# Patient Record
Sex: Male | Born: 1940 | Race: White | Hispanic: No | Marital: Married | Smoking: Never smoker
Health system: Southern US, Community
[De-identification: ages and names within clinical notes are randomized; demographics above are authoritative.]

## PROBLEM LIST (undated history)

## (undated) DIAGNOSIS — I10 Essential (primary) hypertension: Secondary | ICD-10-CM

## (undated) DIAGNOSIS — E785 Hyperlipidemia, unspecified: Secondary | ICD-10-CM

## (undated) DIAGNOSIS — I2510 Atherosclerotic heart disease of native coronary artery without angina pectoris: Secondary | ICD-10-CM

## (undated) DIAGNOSIS — M109 Gout, unspecified: Secondary | ICD-10-CM

## (undated) DIAGNOSIS — I739 Peripheral vascular disease, unspecified: Secondary | ICD-10-CM

## (undated) DIAGNOSIS — I219 Acute myocardial infarction, unspecified: Secondary | ICD-10-CM

## (undated) DIAGNOSIS — I779 Disorder of arteries and arterioles, unspecified: Secondary | ICD-10-CM

## (undated) HISTORY — PX: COLONOSCOPY: SHX174

## (undated) HISTORY — PX: TONSILLECTOMY: SUR1361

## (undated) HISTORY — DX: Atherosclerotic heart disease of native coronary artery without angina pectoris: I25.10

## (undated) HISTORY — DX: Peripheral vascular disease, unspecified: I73.9

## (undated) HISTORY — DX: Disorder of arteries and arterioles, unspecified: I77.9

## (undated) HISTORY — DX: Hyperlipidemia, unspecified: E78.5

---

## 2001-04-21 ENCOUNTER — Ambulatory Visit (HOSPITAL_COMMUNITY): Admission: RE | Admit: 2001-04-21 | Discharge: 2001-04-21 | Payer: Self-pay | Admitting: Internal Medicine

## 2001-04-21 ENCOUNTER — Encounter: Payer: Self-pay | Admitting: Internal Medicine

## 2002-04-07 HISTORY — PX: CORONARY ARTERY BYPASS GRAFT: SHX141

## 2002-06-07 ENCOUNTER — Encounter (HOSPITAL_COMMUNITY): Admission: RE | Admit: 2002-06-07 | Discharge: 2002-07-07 | Payer: Self-pay | Admitting: Orthopaedic Surgery

## 2002-06-07 ENCOUNTER — Encounter: Payer: Self-pay | Admitting: Family Medicine

## 2002-06-07 ENCOUNTER — Ambulatory Visit (HOSPITAL_COMMUNITY): Admission: RE | Admit: 2002-06-07 | Discharge: 2002-06-07 | Payer: Self-pay | Admitting: Family Medicine

## 2007-04-08 DIAGNOSIS — I219 Acute myocardial infarction, unspecified: Secondary | ICD-10-CM

## 2007-04-08 HISTORY — DX: Acute myocardial infarction, unspecified: I21.9

## 2007-04-08 HISTORY — PX: CARDIAC CATHETERIZATION: SHX172

## 2007-10-25 ENCOUNTER — Inpatient Hospital Stay (HOSPITAL_COMMUNITY): Admission: EM | Admit: 2007-10-25 | Discharge: 2007-10-28 | Payer: Self-pay | Admitting: Emergency Medicine

## 2008-04-07 HISTORY — PX: DOPPLER ECHOCARDIOGRAPHY: SHX263

## 2008-08-22 ENCOUNTER — Ambulatory Visit (HOSPITAL_COMMUNITY): Admission: RE | Admit: 2008-08-22 | Discharge: 2008-08-22 | Payer: Self-pay | Admitting: Family Medicine

## 2008-09-01 ENCOUNTER — Ambulatory Visit (HOSPITAL_COMMUNITY): Admission: RE | Admit: 2008-09-01 | Discharge: 2008-09-01 | Payer: Self-pay | Admitting: Family Medicine

## 2008-09-13 ENCOUNTER — Ambulatory Visit (HOSPITAL_COMMUNITY): Admission: RE | Admit: 2008-09-13 | Discharge: 2008-09-13 | Payer: Self-pay | Admitting: Sports Medicine

## 2008-09-22 ENCOUNTER — Encounter (HOSPITAL_COMMUNITY): Admission: RE | Admit: 2008-09-22 | Discharge: 2008-10-22 | Payer: Self-pay | Admitting: Chiropractic Medicine

## 2009-01-01 ENCOUNTER — Encounter: Payer: Self-pay | Admitting: Internal Medicine

## 2009-01-10 ENCOUNTER — Ambulatory Visit: Payer: Self-pay | Admitting: Internal Medicine

## 2009-01-10 ENCOUNTER — Ambulatory Visit (HOSPITAL_COMMUNITY): Admission: RE | Admit: 2009-01-10 | Discharge: 2009-01-10 | Payer: Self-pay | Admitting: Internal Medicine

## 2010-04-07 HISTORY — PX: NM MYOVIEW LTD: HXRAD82

## 2010-08-20 NOTE — Cardiovascular Report (Signed)
NAMEMarland Johnson  JACKEY, HOUSEY NO.:  [id number]   MEDICAL RECORD NO.:  [id number]          PATIENT TYPE:  INP   LOCATION:  2909                         FACILITY:  [facility]   PHYSICIAN:  Madaline Savage, M.D.DATE OF BIRTH:  04/02/1941   DATE OF PROCEDURE:  10/25/2007  DATE OF DISCHARGE:                            CARDIAC CATHETERIZATION   PROCEDURES PERFORMED:  1. Selective coronary angiography by Judkins technique.  2. Retrograde left heart catheterization.  3. Left ventricular angiography.  4. Selective visualization of the left internal mammary artery graft      to the left anterior descending artery.  5. Selective visualization of the saphenous vein graft to the distal      right coronary artery.  6. Percutaneous coronary artery stenting of a 100% proximally occluded      saphenous vein graft to the distal right carotid artery and the      entire length of the saphenous vein graft received overlapping      stents!   COMPLICATIONS:  None.   MEDICATIONS:  Given fentanyl and Versed for sedation, intravenous  Integrilin along with heparin with ACTs running in the mid 200s during  the case, IV nitroglycerin, intracoronary adenosine, and intracoronary  nitroglycerin.   DEVICES USED:  1. AngioJet device for thrombus and vein graft.  2. Balloon angioplasty followed by intracoronary stenting.   RESULT OF PROCEDURE:  The patient received 5 new non-drug-eluting  overlapping stents with an improved profile of blood flow, but poor  distal profusion, possibly due to a non-reflow phenomenon.   PATIENT PROFILE:  The patient is a 70 year old gentleman who began  having chest pain several days ago and went to the office at [facility] in [place] and was seen by Dr. Artis Delay.  There, Dr. Sherwood Gambler referred them to [facility] and he arrived in  our emergency room.  There, he had an EKG showing 1 mm of ST segment  elevation in his inferior leads II, III,  and aVF and also had a troponin  of 4.69.  The patient was placed on IV nitroglycerin and heparin and  later Integrilin was started, and he was scheduled for the first  available spot in the Cardiac Cath Lab, and we eventually came to the  Cath Lab and completed the case.  No complications occurred.   FINAL RESULT:  Suboptimal opacification of the stented new non-drug-  eluting stents deployed in the saphenous vein graft to the right  coronary artery, which was felt to be the culprit vessel of this  infarct.   PLAN:  The patient will be recovered in the Coronary Care Unit.  We will  plan on keeping him on 2b3a antiplatelet inhibitor, aspirin, heparin,  and he has received 600 mg of Plavix with 40 of famotidine.  We will  plan on restudying this right coronary artery in approximately 48 hours.           ______________________________  Madaline Savage, M.D.     WHG/MEDQ  D:  10/25/2007  T:  10/26/2007  Job:  [id number]   cc:  [facility] Cath Lab

## 2010-08-20 NOTE — Discharge Summary (Signed)
NAMEMarland Kitchen  Jason Johnson, Jason Johnson NO.:  [id number]   MEDICAL RECORD NO.:  [id number]          PATIENT TYPE:  INP   LOCATION:  2909                         FACILITY:  [facility]   PHYSICIAN:  Madaline Savage, M.D.DATE OF BIRTH:  05-27-1940   DATE OF ADMISSION:  10/25/2007  DATE OF DISCHARGE:  10/28/2007                               DISCHARGE SUMMARY   DISCHARGE DIAGNOSES:  1. Acute coronary syndrome status post ST-elevation myocardial      infarction on admission.  The patient underwent emergency      catheterization, with stenting of the saphenous vein graft to      posterior descending artery using multiple non-drug-eluting stents.  2. Known previous history of coronary artery disease, with coronary      artery bypass graft in 1999.  3. Hypertension.  4. Dyslipidemia, with elevated LDL and low HDL.   This is a 70 year old Caucasian gentleman who presented with complaints  of chest pain to his primary care physician in [place], Dr. Sherwood Gambler.  He complained of chest pain across the chest radiating to the left  shoulder and rated pain as 7/10.  It also radiated to the left arm, and  the patient had mild shortness of breath.  He also said that the  symptoms were identical to those prior to coronary artery bypass  grafting and primary care physician contacted our office and transported  the patient to [facility] for our further evaluation.  He has a  history of coronary artery disease and underwent CABG in [facility] in 1999.  Since then, was in good health up until the  day he presented to primary care physician with complaints of chest  pain.   EKG was performed and revealed ST elevations of 1 to 1.5 mm in the  inferior leads II, III, and aVF.  His troponin was elevated at 4.69 on  admission.  Code STEMI was initiated, and the patient went to  catheterization lab straight from the emergency department.  Dr. Elsie Lincoln  performed coronary angiography,  which revealed patent left internal  mammary artery graft to the LAD and occluded SVG to the RCA, and the  patient received 5 non-drug-eluting overlapping stents, with improvement  of the blood flow but poor distal perfusion, possibly due to non-reflow  phenomenon.  The patient tolerated the procedure well, and straight from  the catheterization lab was transferred to the Cardiac Critical Care  Unit, and he was given weight-adjusted Integrilin and heparin as well as  nitroglycerin drip.  The next day, the patient was seen by Dr. Jacinto Halim and  was enrolled in cardiac rehabilitation, phase I.  Dr. Elsie Lincoln was  following the patient through the rest of his hospital stay, and on October 28, 2007, he was considered to be stable for discharge home.   HOSPITAL LABORATORIES:  His cardiac enzymes on admission were elevated;  total CK of 173, CK-MB 65, relative index 13.7, and troponin 4.39.  Second set showed total CK 400, CK-MB 36.3, relative index 9.1, and  troponin 7.71, and this was the peak of markers.  The following cardiac  panels revealed declining trend, with the last panel on October 28, 2007,  showing total CK 166, CK-MB 8.5, relative index 5.1, and troponin 4.58.  CBC showed white blood cell count 9.9, hemoglobin 12.4, hematocrit 35.9,  and platelet count 191.  Lipid profile revealed total cholesterol 150,  triglycerides 97, HDL 29, and cholesterol LDL 102.  BMET revealed sodium  137, potassium 3.9, chloride 103, CO2 of 26, glucose 90, BUN 13, and  creatinine 1.01.   Magnesium level 2.3.  TSH 1.089.   The patient was enrolled in cardiac rehabilitation phase II at a time  prior to his discharge home.   DISCHARGE INSTRUCTIONS:  The patient is to stay on low-fat, low-  cholesterol diet.  He was instructed to increase activity slowly and  advised not to engage in strenuous physical activity, driving, or do  heavy lifting for a week post catheterization.   DISCHARGE MEDICATIONS:  Plavix 75 mg  daily, aspirin 325 mg daily,  Vasotec 20 mg daily, Coreg 12.5 mg b.i.d., Imdur 30 mg daily, Zocor 40  mg daily, and Niaspan 1000 mg daily.   DISCHARGE FOLLOWUP:  Scheduled in our office in [place] with Dr.  Elsie Lincoln on November 08, 2007, at 11:30.       Raymon Mutton, P.A.    ______________________________  Madaline Savage, M.D.    MK/MEDQ  D:  10/28/2007  T:  10/29/2007  Job:  [id number]   cc:   Madaline Savage, M.D.  [facility]  [facility]  Madelin Rear. Sherwood Gambler, MD

## 2011-01-03 LAB — CARDIAC PANEL(CRET KIN+CKTOT+MB+TROPI)
CK, MB: 16.7 — ABNORMAL HIGH
Relative Index: 8.9 — ABNORMAL HIGH
Total CK: 235 — ABNORMAL HIGH
Troponin I: 7.71

## 2011-01-03 LAB — LIPID PANEL
Cholesterol: 239 — ABNORMAL HIGH
LDL Cholesterol: 175 — ABNORMAL HIGH
Total CHOL/HDL Ratio: 5.2
Total CHOL/HDL Ratio: 5.8
Triglycerides: 97
VLDL: 19

## 2011-01-03 LAB — DIFFERENTIAL
Basophils Absolute: 0
Basophils Relative: 0
Eosinophils Absolute: 0
Eosinophils Relative: 0
Lymphocytes Relative: 7 — ABNORMAL LOW
Monocytes Absolute: 0.7

## 2011-01-03 LAB — BASIC METABOLIC PANEL
BUN: 11
CO2: 25
CO2: 29
Chloride: 103
Chloride: 103
Creatinine, Ser: 1.01
GFR calc Af Amer: >60
GFR calc non Af Amer: >60
GFR calc non Af Amer: >60
Glucose, Bld: 104 — ABNORMAL HIGH
Glucose, Bld: 98
Potassium: 3.7
Potassium: 3.9
Sodium: 132 — ABNORMAL LOW

## 2011-01-03 LAB — CBC
HCT: 36.1 — ABNORMAL LOW
HCT: 41.9
HCT: 47.4
Hemoglobin: 12.4 — ABNORMAL LOW
Hemoglobin: 14.1
Hemoglobin: 14.1
Hemoglobin: 16.3
MCHC: 34.1
MCHC: 34.5
MCV: 91.5
MCV: 92.2
RBC: 3.92 — ABNORMAL LOW
RBC: 3.92 — ABNORMAL LOW
RDW: 13.1
RDW: 13.2
RDW: 13.5
WBC: 11.1 — ABNORMAL HIGH
WBC: 14 — ABNORMAL HIGH
WBC: 9.9

## 2011-01-03 LAB — COMPREHENSIVE METABOLIC PANEL
ALT: 25
AST: 76 — ABNORMAL HIGH
Albumin: 4.1
Alkaline Phosphatase: 53
Chloride: 103
GFR calc Af Amer: >60
Potassium: 4
Sodium: 139
Total Bilirubin: 0.9

## 2011-01-03 LAB — APTT: aPTT: 30

## 2011-01-03 LAB — HEPARIN LEVEL (UNFRACTIONATED): Heparin Unfractionated: 0.31

## 2011-01-03 LAB — PLATELET COUNT: Platelets: 214

## 2011-01-03 LAB — CK TOTAL AND CKMB (NOT AT ARMC): Relative Index: 13.7 — ABNORMAL HIGH

## 2011-05-20 DIAGNOSIS — I2510 Atherosclerotic heart disease of native coronary artery without angina pectoris: Secondary | ICD-10-CM | POA: Diagnosis not present

## 2011-05-20 DIAGNOSIS — I10 Essential (primary) hypertension: Secondary | ICD-10-CM | POA: Diagnosis not present

## 2011-05-20 DIAGNOSIS — E782 Mixed hyperlipidemia: Secondary | ICD-10-CM | POA: Diagnosis not present

## 2011-06-19 DIAGNOSIS — R0989 Other specified symptoms and signs involving the circulatory and respiratory systems: Secondary | ICD-10-CM | POA: Diagnosis not present

## 2011-11-06 DIAGNOSIS — I6529 Occlusion and stenosis of unspecified carotid artery: Secondary | ICD-10-CM | POA: Diagnosis not present

## 2011-11-06 DIAGNOSIS — I10 Essential (primary) hypertension: Secondary | ICD-10-CM | POA: Diagnosis not present

## 2011-11-06 DIAGNOSIS — I2510 Atherosclerotic heart disease of native coronary artery without angina pectoris: Secondary | ICD-10-CM | POA: Diagnosis not present

## 2011-11-06 DIAGNOSIS — E782 Mixed hyperlipidemia: Secondary | ICD-10-CM | POA: Diagnosis not present

## 2011-11-11 DIAGNOSIS — E782 Mixed hyperlipidemia: Secondary | ICD-10-CM | POA: Diagnosis not present

## 2011-11-11 DIAGNOSIS — Z79899 Other long term (current) drug therapy: Secondary | ICD-10-CM | POA: Diagnosis not present

## 2011-12-23 DIAGNOSIS — I6529 Occlusion and stenosis of unspecified carotid artery: Secondary | ICD-10-CM | POA: Diagnosis not present

## 2012-01-30 DIAGNOSIS — I2510 Atherosclerotic heart disease of native coronary artery without angina pectoris: Secondary | ICD-10-CM | POA: Diagnosis not present

## 2012-01-30 DIAGNOSIS — I10 Essential (primary) hypertension: Secondary | ICD-10-CM | POA: Diagnosis not present

## 2012-01-30 DIAGNOSIS — J019 Acute sinusitis, unspecified: Secondary | ICD-10-CM | POA: Diagnosis not present

## 2012-01-30 DIAGNOSIS — E785 Hyperlipidemia, unspecified: Secondary | ICD-10-CM | POA: Diagnosis not present

## 2012-01-30 DIAGNOSIS — J069 Acute upper respiratory infection, unspecified: Secondary | ICD-10-CM | POA: Diagnosis not present

## 2012-01-30 DIAGNOSIS — Z6828 Body mass index (BMI) 28.0-28.9, adult: Secondary | ICD-10-CM | POA: Diagnosis not present

## 2012-02-07 DIAGNOSIS — Z23 Encounter for immunization: Secondary | ICD-10-CM | POA: Diagnosis not present

## 2012-05-19 DIAGNOSIS — I10 Essential (primary) hypertension: Secondary | ICD-10-CM | POA: Diagnosis not present

## 2012-05-19 DIAGNOSIS — Z6828 Body mass index (BMI) 28.0-28.9, adult: Secondary | ICD-10-CM | POA: Diagnosis not present

## 2012-05-19 DIAGNOSIS — J019 Acute sinusitis, unspecified: Secondary | ICD-10-CM | POA: Diagnosis not present

## 2012-07-01 DIAGNOSIS — I10 Essential (primary) hypertension: Secondary | ICD-10-CM | POA: Diagnosis not present

## 2012-07-01 DIAGNOSIS — E782 Mixed hyperlipidemia: Secondary | ICD-10-CM | POA: Diagnosis not present

## 2012-07-01 DIAGNOSIS — I6529 Occlusion and stenosis of unspecified carotid artery: Secondary | ICD-10-CM | POA: Diagnosis not present

## 2012-07-01 DIAGNOSIS — I2510 Atherosclerotic heart disease of native coronary artery without angina pectoris: Secondary | ICD-10-CM | POA: Diagnosis not present

## 2012-07-05 ENCOUNTER — Other Ambulatory Visit (HOSPITAL_COMMUNITY): Payer: Self-pay | Admitting: Cardiovascular Disease

## 2012-07-15 DIAGNOSIS — H2589 Other age-related cataract: Secondary | ICD-10-CM | POA: Diagnosis not present

## 2012-07-15 DIAGNOSIS — H5210 Myopia, unspecified eye: Secondary | ICD-10-CM | POA: Diagnosis not present

## 2012-07-15 DIAGNOSIS — H52229 Regular astigmatism, unspecified eye: Secondary | ICD-10-CM | POA: Diagnosis not present

## 2012-07-15 DIAGNOSIS — H524 Presbyopia: Secondary | ICD-10-CM | POA: Diagnosis not present

## 2012-07-29 ENCOUNTER — Ambulatory Visit (HOSPITAL_COMMUNITY)
Admission: RE | Admit: 2012-07-29 | Discharge: 2012-07-29 | Disposition: A | Discharge status: Home / Self Care | Payer: Medicare Other | Source: Ambulatory Visit | Attending: Cardiovascular Disease | Admitting: Cardiovascular Disease

## 2012-07-29 DIAGNOSIS — I7789 Other specified disorders of arteries and arterioles: Secondary | ICD-10-CM | POA: Insufficient documentation

## 2012-07-29 DIAGNOSIS — I6529 Occlusion and stenosis of unspecified carotid artery: Secondary | ICD-10-CM | POA: Diagnosis not present

## 2012-07-29 NOTE — Progress Notes (Signed)
Carotid Duplex Imaging Complete Gilad Dugger 

## 2012-08-05 ENCOUNTER — Other Ambulatory Visit (HOSPITAL_COMMUNITY): Payer: Self-pay | Admitting: *Deleted

## 2012-08-19 DIAGNOSIS — H2589 Other age-related cataract: Secondary | ICD-10-CM | POA: Diagnosis not present

## 2012-08-31 ENCOUNTER — Encounter (HOSPITAL_COMMUNITY): Payer: Self-pay | Admitting: Pharmacy Technician

## 2012-09-02 NOTE — Patient Instructions (Addendum)
Your procedure is scheduled on: 09/09/2012  Report to [facility] at  1300    AM.  Call this number if you have problems the morning of surgery: [phone]   Do not eat food or drink liquids :After Midnight.      Take these medicines the morning of surgery with A SIP OF WATER: coreg   Do not wear jewelry, make-up or nail polish.  Do not wear lotions, powders, or perfumes.   Do not shave 48 hours prior to surgery.  Do not bring valuables to the hospital.  Contacts, dentures or bridgework may not be worn into surgery.  Leave suitcase in the car. After surgery it may be brought to your room.  For patients admitted to the hospital, checkout time is 11:00 AM the day of discharge.   Patients discharged the day of surgery will not be allowed to drive home.  :     Please read over the following fact sheets that you were given: Coughing and Deep Breathing, Surgical Site Infection Prevention, Anesthesia Post-op Instructions and Care and Recovery After Surgery    Cataract A cataract is a clouding of the lens of the eye. When a lens becomes cloudy, vision is reduced based on the degree and nature of the clouding. Many cataracts reduce vision to some degree. Some cataracts make people more near-sighted as they develop. Other cataracts increase glare. Cataracts that are ignored and become worse can sometimes look white. The white color can be seen through the pupil. CAUSES   Aging. However, cataracts may occur at any age, even in newborns.   Certain drugs.   Trauma to the eye.   Certain diseases such as diabetes.   Specific eye diseases such as chronic inflammation inside the eye or a sudden attack of a rare form of glaucoma.   Inherited or acquired medical problems.  SYMPTOMS   Gradual, progressive drop in vision in the affected eye.   Severe, rapid visual loss. This most often happens when trauma is the cause.  DIAGNOSIS  To detect a cataract, an eye doctor examines the lens. Cataracts  are best diagnosed with an exam of the eyes with the pupils enlarged (dilated) by drops.  TREATMENT  For an early cataract, vision may improve by using different eyeglasses or stronger lighting. If that does not help your vision, surgery is the only effective treatment. A cataract needs to be surgically removed when vision loss interferes with your everyday activities, such as driving, reading, or watching TV. A cataract may also have to be removed if it prevents examination or treatment of another eye problem. Surgery removes the cloudy lens and usually replaces it with a substitute lens (intraocular lens, IOL).  At a time when both you and your doctor agree, the cataract will be surgically removed. If you have cataracts in both eyes, only one is usually removed at a time. This allows the operated eye to heal and be out of danger from any possible problems after surgery (such as infection or poor wound healing). In rare cases, a cataract may be doing damage to your eye. In these cases, your caregiver may advise surgical removal right away. The vast majority of people who have cataract surgery have better vision afterward. HOME CARE INSTRUCTIONS  If you are not planning surgery, you may be asked to do the following:  Use different eyeglasses.   Use stronger or brighter lighting.   Ask your eye doctor about reducing your medicine dose or changing  medicines if it is thought that a medicine caused your cataract. Changing medicines does not make the cataract go away on its own.   Become familiar with your surroundings. Poor vision can lead to injury. Avoid bumping into things on the affected side. You are at a higher risk for tripping or falling.   Exercise extreme care when driving or operating machinery.   Wear sunglasses if you are sensitive to bright light or experiencing problems with glare.  SEEK IMMEDIATE MEDICAL CARE IF:   You have a worsening or sudden vision loss.   You notice redness,  swelling, or increasing pain in the eye.   You have a fever.  Document Released: 03/24/2005 Document Revised: 03/13/2011 Document Reviewed: 11/15/2010 [facility] Patient Information 2012 [place].PATIENT INSTRUCTIONS POST-ANESTHESIA  IMMEDIATELY FOLLOWING SURGERY:  Do not drive or operate machinery for the first twenty four hours after surgery.  Do not make any important decisions for twenty four hours after surgery or while taking narcotic pain medications or sedatives.  If you develop intractable nausea and vomiting or a severe headache please notify your doctor immediately.  FOLLOW-UP:  Please make an appointment with your surgeon as instructed. You do not need to follow up with anesthesia unless specifically instructed to do so.  WOUND CARE INSTRUCTIONS (if applicable):  Keep a dry clean dressing on the anesthesia/puncture wound site if there is drainage.  Once the wound has quit draining you may leave it open to air.  Generally you should leave the bandage intact for twenty four hours unless there is drainage.  If the epidural site drains for more than 36-48 hours please call the anesthesia department.  QUESTIONS?:  Please feel free to call your physician or the hospital operator if you have any questions, and they will be happy to assist you.

## 2012-09-03 ENCOUNTER — Encounter (HOSPITAL_COMMUNITY)
Admission: RE | Admit: 2012-09-03 | Discharge: 2012-09-03 | Disposition: A | Discharge status: Home / Self Care | Payer: Medicare Other | Source: Ambulatory Visit | Attending: Ophthalmology | Admitting: Ophthalmology

## 2012-09-03 ENCOUNTER — Encounter (HOSPITAL_COMMUNITY): Payer: Self-pay

## 2012-09-03 DIAGNOSIS — Z01812 Encounter for preprocedural laboratory examination: Secondary | ICD-10-CM | POA: Diagnosis not present

## 2012-09-03 DIAGNOSIS — I10 Essential (primary) hypertension: Secondary | ICD-10-CM | POA: Diagnosis not present

## 2012-09-03 DIAGNOSIS — H2589 Other age-related cataract: Secondary | ICD-10-CM | POA: Diagnosis not present

## 2012-09-03 DIAGNOSIS — Z79899 Other long term (current) drug therapy: Secondary | ICD-10-CM | POA: Diagnosis not present

## 2012-09-03 DIAGNOSIS — Z01810 Encounter for preprocedural cardiovascular examination: Secondary | ICD-10-CM | POA: Diagnosis not present

## 2012-09-03 HISTORY — DX: Essential (primary) hypertension: I10

## 2012-09-03 HISTORY — DX: Acute myocardial infarction, unspecified: I21.9

## 2012-09-03 LAB — BASIC METABOLIC PANEL
CO2: 28 mEq/L (ref 19–32)
Glucose, Bld: 89 mg/dL (ref 70–99)
Potassium: 4.4 mEq/L (ref 3.5–5.1)
Sodium: 139 mEq/L (ref 135–145)

## 2012-09-03 LAB — HEMOGLOBIN AND HEMATOCRIT, BLOOD: HCT: 42.6 % (ref 39.0–52.0)

## 2012-09-03 NOTE — Progress Notes (Signed)
09/03/12 1410  OBSTRUCTIVE SLEEP APNEA  Have you ever been diagnosed with sleep apnea through a sleep study? No  Do you snore loudly (loud enough to be heard through closed doors)?  1  Do you often feel tired, fatigued, or sleepy during the daytime? 0  Has anyone observed you stop breathing during your sleep? 0  Do you have, or are you being treated for high blood pressure? 1  BMI more than 35 kg/m2? 0  Age over 70 years old? 1  Neck circumference greater than 40 cm/18 inches? 0  Gender: 1  Obstructive Sleep Apnea Score 4  Score 4 or greater  Results sent to PCP

## 2012-09-09 ENCOUNTER — Encounter (HOSPITAL_COMMUNITY): Payer: Self-pay | Admitting: Anesthesiology

## 2012-09-09 ENCOUNTER — Ambulatory Visit (HOSPITAL_COMMUNITY)
Admission: RE | Admit: 2012-09-09 | Discharge: 2012-09-09 | Disposition: A | Discharge status: Home / Self Care | Payer: Medicare Other | Source: Ambulatory Visit | Attending: Ophthalmology | Admitting: Ophthalmology

## 2012-09-09 ENCOUNTER — Encounter (HOSPITAL_COMMUNITY)
Admission: RE | Disposition: A | Discharge status: Home / Self Care | Payer: Self-pay | Source: Ambulatory Visit | Attending: Ophthalmology

## 2012-09-09 ENCOUNTER — Encounter (HOSPITAL_COMMUNITY): Payer: Self-pay | Admitting: *Deleted

## 2012-09-09 ENCOUNTER — Ambulatory Visit (HOSPITAL_COMMUNITY): Payer: Medicare Other | Admitting: Anesthesiology

## 2012-09-09 DIAGNOSIS — H269 Unspecified cataract: Secondary | ICD-10-CM | POA: Diagnosis not present

## 2012-09-09 DIAGNOSIS — Z79899 Other long term (current) drug therapy: Secondary | ICD-10-CM | POA: Insufficient documentation

## 2012-09-09 DIAGNOSIS — Z01812 Encounter for preprocedural laboratory examination: Secondary | ICD-10-CM | POA: Insufficient documentation

## 2012-09-09 DIAGNOSIS — I10 Essential (primary) hypertension: Secondary | ICD-10-CM | POA: Diagnosis not present

## 2012-09-09 DIAGNOSIS — H2589 Other age-related cataract: Secondary | ICD-10-CM | POA: Insufficient documentation

## 2012-09-09 DIAGNOSIS — Z01810 Encounter for preprocedural cardiovascular examination: Secondary | ICD-10-CM | POA: Diagnosis not present

## 2012-09-09 DIAGNOSIS — H2510 Age-related nuclear cataract, unspecified eye: Secondary | ICD-10-CM | POA: Diagnosis not present

## 2012-09-09 HISTORY — PX: CATARACT EXTRACTION W/PHACO: SHX586

## 2012-09-09 SURGERY — PHACOEMULSIFICATION, CATARACT, WITH IOL INSERTION
Anesthesia: Monitor Anesthesia Care | Site: Eye | Laterality: Right | Wound class: Clean

## 2012-09-09 MED ORDER — BSS IO SOLN
INTRAOCULAR | Status: DC | PRN
  Administered 2012-09-09: 15 mL via INTRAOCULAR

## 2012-09-09 MED ORDER — PHENYLEPHRINE HCL 2.5 % OP SOLN
1.0000 [drp] | OPHTHALMIC | Status: AC
  Administered 2012-09-09 (×3): 1 [drp] via OPHTHALMIC

## 2012-09-09 MED ORDER — PHENYLEPHRINE HCL 2.5 % OP SOLN
OPHTHALMIC | Status: AC
  Filled 2012-09-09: qty 2

## 2012-09-09 MED ORDER — CYCLOPENTOLATE-PHENYLEPHRINE 0.2-1 % OP SOLN
OPHTHALMIC | Status: AC
  Filled 2012-09-09: qty 2

## 2012-09-09 MED ORDER — LACTATED RINGERS IV SOLN
INTRAVENOUS | Status: DC
  Administered 2012-09-09: 15:00:00 via INTRAVENOUS

## 2012-09-09 MED ORDER — TETRACAINE HCL 0.5 % OP SOLN
OPHTHALMIC | Status: AC
  Filled 2012-09-09: qty 2

## 2012-09-09 MED ORDER — NEOMYCIN-POLYMYXIN-DEXAMETH 3.5-10000-0.1 OP OINT
TOPICAL_OINTMENT | OPHTHALMIC | Status: AC
  Filled 2012-09-09: qty 3.5

## 2012-09-09 MED ORDER — CYCLOPENTOLATE-PHENYLEPHRINE 0.2-1 % OP SOLN
1.0000 [drp] | OPHTHALMIC | Status: AC
  Administered 2012-09-09 (×3): 1 [drp] via OPHTHALMIC

## 2012-09-09 MED ORDER — EPINEPHRINE HCL 1 MG/ML IJ SOLN
INTRAOCULAR | Status: DC | PRN
  Administered 2012-09-09: 15:00:00

## 2012-09-09 MED ORDER — MIDAZOLAM HCL 2 MG/2ML IJ SOLN
INTRAMUSCULAR | Status: AC
  Filled 2012-09-09: qty 2

## 2012-09-09 MED ORDER — LIDOCAINE HCL 3.5 % OP GEL
OPHTHALMIC | Status: AC
  Filled 2012-09-09: qty 5

## 2012-09-09 MED ORDER — LIDOCAINE HCL (PF) 1 % IJ SOLN
INTRAMUSCULAR | Status: AC
  Filled 2012-09-09: qty 2

## 2012-09-09 MED ORDER — PROVISC 10 MG/ML IO SOLN
INTRAOCULAR | Status: DC | PRN
  Administered 2012-09-09: 8.5 mg via INTRAOCULAR

## 2012-09-09 MED ORDER — LACTATED RINGERS IV SOLN
INTRAVENOUS | Status: DC | PRN
  Administered 2012-09-09: 14:00:00 via INTRAVENOUS

## 2012-09-09 MED ORDER — EPINEPHRINE HCL 1 MG/ML IJ SOLN
INTRAMUSCULAR | Status: AC
  Filled 2012-09-09: qty 1

## 2012-09-09 MED ORDER — LIDOCAINE HCL 3.5 % OP GEL
1.0000 "application " | Freq: Once | OPHTHALMIC | Status: AC
  Administered 2012-09-09: 1 via OPHTHALMIC

## 2012-09-09 MED ORDER — POVIDONE-IODINE 5 % OP SOLN
OPHTHALMIC | Status: DC | PRN
  Administered 2012-09-09: 1 via OPHTHALMIC

## 2012-09-09 MED ORDER — TETRACAINE HCL 0.5 % OP SOLN
1.0000 [drp] | OPHTHALMIC | Status: AC
  Administered 2012-09-09 (×3): 1 [drp] via OPHTHALMIC

## 2012-09-09 MED ORDER — NEOMYCIN-POLYMYXIN-DEXAMETH 0.1 % OP OINT
TOPICAL_OINTMENT | OPHTHALMIC | Status: DC | PRN
  Administered 2012-09-09: 1 via OPHTHALMIC

## 2012-09-09 MED ORDER — LIDOCAINE HCL (PF) 1 % IJ SOLN
INTRAMUSCULAR | Status: DC | PRN
Start: 2012-09-09 — End: 2012-09-09
  Administered 2012-09-09: .5 mL

## 2012-09-09 MED ORDER — MIDAZOLAM HCL 2 MG/2ML IJ SOLN
1.0000 mg | INTRAMUSCULAR | Status: DC | PRN
  Administered 2012-09-09: 2 mg via INTRAVENOUS

## 2012-09-09 SURGICAL SUPPLY — 32 items

## 2012-09-09 NOTE — Anesthesia Procedure Notes (Signed)
Procedure Name: MAC Date/Time: 09/09/2012 3:20 PM Performed by: Carolyne Littles, AMY L Pre-anesthesia Checklist: Patient identified, Timeout performed, Emergency Drugs available, Suction available and Patient being monitored Oxygen Delivery Method: Nasal cannula

## 2012-09-09 NOTE — H&P (Signed)
I have reviewed the H&P, the patient was re-examined, and I have identified no interval changes in medical condition and plan of care since the history and physical of record  

## 2012-09-09 NOTE — Anesthesia Preprocedure Evaluation (Addendum)
Anesthesia Evaluation  Patient identified by MRN, date of birth, ID band Patient awake    Reviewed: Allergy & Precautions, H&P , NPO status , Patient's Chart, lab work & pertinent test results  Airway Mallampati: II TM Distance: >3 FB     Dental  (+) Teeth Intact   Pulmonary neg pulmonary ROS,  breath sounds clear to auscultation        Cardiovascular hypertension, Pt. on medications + CAD, + Past MI and + CABG Rhythm:Regular Rate:Normal     Neuro/Psych    GI/Hepatic negative GI ROS,   Endo/Other    Renal/GU      Musculoskeletal   Abdominal   Peds  Hematology   Anesthesia Other Findings   Reproductive/Obstetrics                           Anesthesia Physical Anesthesia Plan  ASA: III  Anesthesia Plan: MAC   Post-op Pain Management:    Induction: Intravenous  Airway Management Planned: Nasal Cannula  Additional Equipment:   Intra-op Plan:   Post-operative Plan:   Informed Consent: I have reviewed the patients History and Physical, chart, labs and discussed the procedure including the risks, benefits and alternatives for the proposed anesthesia with the patient or authorized representative who has indicated his/her understanding and acceptance.     Plan Discussed with:   Anesthesia Plan Comments:         Anesthesia Quick Evaluation

## 2012-09-09 NOTE — Anesthesia Postprocedure Evaluation (Signed)
  Anesthesia Post-op Note  Patient: Jason Johnson  Procedure(s) Performed: Procedure(s) with comments: CATARACT EXTRACTION PHACO AND INTRAOCULAR LENS PLACEMENT (IOC) (Right) - CDE 14.48  Patient Location: PACU and Short Stay  Anesthesia Type:MAC  Level of Consciousness: awake, alert  and oriented  Airway and Oxygen Therapy: Patient Spontanous Breathing  Post-op Pain: none  Post-op Assessment: Post-op Vital signs reviewed, Patient's Cardiovascular Status Stable, Respiratory Function Stable, Patent Airway and No signs of Nausea or vomiting  Post-op Vital Signs: Reviewed and stable  Complications: No apparent anesthesia complications

## 2012-09-09 NOTE — Op Note (Signed)
Date of Admission: 09/09/2012  Date of Surgery: 09/09/2012  Pre-Op Dx: Cataract  Right  Eye  Post-Op Dx: Combined Cataract  Right  Eye,  Dx Code 366.19  Surgeon: Gemma Payor, M.D.  Assistants: None  Anesthesia: Topical with MAC  Indications: Painless, progressive loss of vision with compromise of daily activities.  Surgery: Cataract Extraction with Intraocular lens Implant Right Eye  Discription: The patient had dilating drops and viscous lidocaine placed into the left eye in the pre-op holding area. After transfer to the operating room, a time out was performed. The patient was then prepped and draped. Beginning with a 75 degree blade a paracentesis port was made at the surgeon's 2 o'clock position. The anterior chamber was then filled with 1% non-preserved lidocaine. This was followed by filling the anterior chamber with Provisc. A bent cystatome needle was used to create a continuous tear capsulotomy. Hydrodissection was performed with balanced salt solution on a Fine canula. The lens nucleus was then removed using the phacoemulsification handpiece. Residual cortex was removed with the I&A handpiece. The anterior chamber and capsular bag were refilled with Provisc. A posterior chamber intraocular lens was placed into the capsular bag with it's injector. The implant was positioned with the Kuglan hook. The Provisc was then removed from the anterior chamber and capsular bag with the I&A handpiece. Stromal hydration of the main incision and paracentesis port was performed with BSS on a Fine canula. The wounds were tested for leak which was negative. The patient tolerated the procedure well. There were no operative complications. The patient was then transferred to the recovery room in stable condition.  Complications: None  Specimen: None  EBL: None  Prosthetic device: B&L enVista, MX60, power 11.5D.

## 2012-09-09 NOTE — Transfer of Care (Signed)
Immediate Anesthesia Transfer of Care Note  Patient: Jason Johnson  Procedure(s) Performed: Procedure(s) with comments: CATARACT EXTRACTION PHACO AND INTRAOCULAR LENS PLACEMENT (IOC) (Right) - CDE 14.48  Patient Location: PACU and Short Stay  Anesthesia Type:MAC  Level of Consciousness: awake, alert  and oriented  Airway & Oxygen Therapy: Patient Spontanous Breathing  Post-op Assessment: Report given to PACU RN  Post vital signs: Reviewed and stable  Complications: No apparent anesthesia complications

## 2012-09-09 NOTE — Preoperative (Signed)
Beta Blockers   Reason not to administer Beta Blockers:Not Applicable 

## 2012-09-10 ENCOUNTER — Encounter (HOSPITAL_COMMUNITY): Payer: Self-pay | Admitting: Ophthalmology

## 2012-09-10 DIAGNOSIS — H2510 Age-related nuclear cataract, unspecified eye: Secondary | ICD-10-CM | POA: Diagnosis not present

## 2012-09-22 ENCOUNTER — Encounter (HOSPITAL_COMMUNITY): Payer: Self-pay | Admitting: Pharmacy Technician

## 2012-09-28 DIAGNOSIS — H2589 Other age-related cataract: Secondary | ICD-10-CM | POA: Diagnosis not present

## 2012-09-30 ENCOUNTER — Encounter (HOSPITAL_COMMUNITY)
Admission: RE | Admit: 2012-09-30 | Discharge: 2012-09-30 | Disposition: A | Discharge status: Home / Self Care | Payer: Medicare Other | Source: Ambulatory Visit | Attending: Ophthalmology | Admitting: Ophthalmology

## 2012-09-30 MED ORDER — ONDANSETRON HCL 4 MG/2ML IJ SOLN: 4.0000 mg | Freq: Once | INTRAMUSCULAR | Status: AC | PRN

## 2012-09-30 MED ORDER — FENTANYL CITRATE 0.05 MG/ML IJ SOLN: 25.0000 ug | INTRAMUSCULAR | Status: DC | PRN

## 2012-10-06 MED ORDER — LIDOCAINE HCL 3.5 % OP GEL
OPHTHALMIC | Status: AC
  Filled 2012-10-06: qty 5

## 2012-10-06 MED ORDER — CYCLOPENTOLATE-PHENYLEPHRINE 0.2-1 % OP SOLN
OPHTHALMIC | Status: AC
  Filled 2012-10-06: qty 2

## 2012-10-06 MED ORDER — TETRACAINE HCL 0.5 % OP SOLN
OPHTHALMIC | Status: AC
  Filled 2012-10-06: qty 2

## 2012-10-06 MED ORDER — NEOMYCIN-POLYMYXIN-DEXAMETH 3.5-10000-0.1 OP OINT
TOPICAL_OINTMENT | OPHTHALMIC | Status: AC
  Filled 2012-10-06: qty 3.5

## 2012-10-06 MED ORDER — LIDOCAINE HCL (PF) 1 % IJ SOLN
INTRAMUSCULAR | Status: AC
  Filled 2012-10-06: qty 2

## 2012-10-07 ENCOUNTER — Encounter (HOSPITAL_COMMUNITY): Payer: Self-pay | Admitting: Anesthesiology

## 2012-10-07 ENCOUNTER — Encounter (HOSPITAL_COMMUNITY): Payer: Self-pay | Admitting: *Deleted

## 2012-10-07 ENCOUNTER — Ambulatory Visit (HOSPITAL_COMMUNITY): Payer: Medicare Other | Admitting: Anesthesiology

## 2012-10-07 ENCOUNTER — Encounter (HOSPITAL_COMMUNITY)
Admission: RE | Disposition: A | Discharge status: Home / Self Care | Payer: Self-pay | Source: Ambulatory Visit | Attending: Ophthalmology

## 2012-10-07 ENCOUNTER — Ambulatory Visit (HOSPITAL_COMMUNITY)
Admission: RE | Admit: 2012-10-07 | Discharge: 2012-10-07 | Disposition: A | Discharge status: Home / Self Care | Payer: Medicare Other | Source: Ambulatory Visit | Attending: Ophthalmology | Admitting: Ophthalmology

## 2012-10-07 DIAGNOSIS — Z79899 Other long term (current) drug therapy: Secondary | ICD-10-CM | POA: Insufficient documentation

## 2012-10-07 DIAGNOSIS — I10 Essential (primary) hypertension: Secondary | ICD-10-CM | POA: Diagnosis not present

## 2012-10-07 DIAGNOSIS — H2589 Other age-related cataract: Secondary | ICD-10-CM | POA: Insufficient documentation

## 2012-10-07 DIAGNOSIS — Z01810 Encounter for preprocedural cardiovascular examination: Secondary | ICD-10-CM | POA: Insufficient documentation

## 2012-10-07 DIAGNOSIS — H269 Unspecified cataract: Secondary | ICD-10-CM | POA: Diagnosis not present

## 2012-10-07 HISTORY — PX: CATARACT EXTRACTION W/PHACO: SHX586

## 2012-10-07 SURGERY — PHACOEMULSIFICATION, CATARACT, WITH IOL INSERTION
Anesthesia: Monitor Anesthesia Care | Site: Eye | Laterality: Left | Wound class: Clean

## 2012-10-07 MED ORDER — ONDANSETRON HCL 4 MG/2ML IJ SOLN: 4.0000 mg | Freq: Once | INTRAMUSCULAR | Status: DC | PRN

## 2012-10-07 MED ORDER — LIDOCAINE 3.5 % OP GEL OPTIME - NO CHARGE
OPHTHALMIC | Status: DC | PRN
  Administered 2012-10-07: 1 [drp] via OPHTHALMIC

## 2012-10-07 MED ORDER — TETRACAINE HCL 0.5 % OP SOLN
1.0000 [drp] | OPHTHALMIC | Status: AC
  Administered 2012-10-07 (×3): 1 [drp] via OPHTHALMIC

## 2012-10-07 MED ORDER — MIDAZOLAM HCL 2 MG/2ML IJ SOLN
1.0000 mg | INTRAMUSCULAR | Status: DC | PRN
  Administered 2012-10-07: 2 mg via INTRAVENOUS

## 2012-10-07 MED ORDER — MIDAZOLAM HCL 2 MG/2ML IJ SOLN
INTRAMUSCULAR | Status: AC
  Filled 2012-10-07: qty 2

## 2012-10-07 MED ORDER — NEOMYCIN-POLYMYXIN-DEXAMETH 0.1 % OP OINT
TOPICAL_OINTMENT | OPHTHALMIC | Status: DC | PRN
  Administered 2012-10-07: 1 via OPHTHALMIC

## 2012-10-07 MED ORDER — CYCLOPENTOLATE-PHENYLEPHRINE 0.2-1 % OP SOLN
1.0000 [drp] | OPHTHALMIC | Status: AC
  Administered 2012-10-07 (×3): 1 [drp] via OPHTHALMIC

## 2012-10-07 MED ORDER — BSS IO SOLN
INTRAOCULAR | Status: DC | PRN
  Administered 2012-10-07: 15 mL via INTRAOCULAR

## 2012-10-07 MED ORDER — FENTANYL CITRATE 0.05 MG/ML IJ SOLN: 25.0000 ug | INTRAMUSCULAR | Status: DC | PRN

## 2012-10-07 MED ORDER — EPINEPHRINE HCL 1 MG/ML IJ SOLN
INTRAOCULAR | Status: DC | PRN
  Administered 2012-10-07: 09:00:00

## 2012-10-07 MED ORDER — PROVISC 10 MG/ML IO SOLN
INTRAOCULAR | Status: DC | PRN
  Administered 2012-10-07: 8.5 mg via INTRAOCULAR

## 2012-10-07 MED ORDER — PHENYLEPHRINE HCL 2.5 % OP SOLN
OPHTHALMIC | Status: AC
  Filled 2012-10-07: qty 15

## 2012-10-07 MED ORDER — LIDOCAINE HCL (PF) 1 % IJ SOLN
INTRAMUSCULAR | Status: DC | PRN
  Administered 2012-10-07: .5 mL

## 2012-10-07 MED ORDER — EPINEPHRINE HCL 1 MG/ML IJ SOLN
INTRAMUSCULAR | Status: AC
  Filled 2012-10-07: qty 1

## 2012-10-07 MED ORDER — LIDOCAINE HCL 3.5 % OP GEL: 1.0000 "application " | Freq: Once | OPHTHALMIC | Status: DC

## 2012-10-07 MED ORDER — LACTATED RINGERS IV SOLN
INTRAVENOUS | Status: DC
  Administered 2012-10-07: 08:00:00 via INTRAVENOUS

## 2012-10-07 MED ORDER — PHENYLEPHRINE HCL 2.5 % OP SOLN
1.0000 [drp] | OPHTHALMIC | Status: AC
  Administered 2012-10-07 (×3): 1 [drp] via OPHTHALMIC

## 2012-10-07 MED ORDER — POVIDONE-IODINE 5 % OP SOLN
OPHTHALMIC | Status: DC | PRN
  Administered 2012-10-07: 1 via OPHTHALMIC

## 2012-10-07 SURGICAL SUPPLY — 32 items

## 2012-10-07 NOTE — Transfer of Care (Signed)
Immediate Anesthesia Transfer of Care Note  Patient: Jason Johnson  Procedure(s) Performed: Procedure(s) with comments: CATARACT EXTRACTION PHACO AND INTRAOCULAR LENS PLACEMENT (IOC) (Left) - CDE:12.24  Patient Location: Short Stay  Anesthesia Type:MAC  Level of Consciousness: awake, alert , oriented and patient cooperative  Airway & Oxygen Therapy: Patient Spontanous Breathing  Post-op Assessment: Report given to PACU RN and Post -op Vital signs reviewed and stable  Post vital signs: Reviewed and stable  Complications: No apparent anesthesia complications

## 2012-10-07 NOTE — H&P (Signed)
I have reviewed the H&P, the patient was re-examined, and I have identified no interval changes in medical condition and plan of care since the history and physical of record  

## 2012-10-07 NOTE — Op Note (Signed)
Date of Admission: 10/07/2012  Date of Surgery: 10/07/2012  Pre-Op Dx: Cataract  Left  Eye  Post-Op Dx: Cataract  Left  Eye,  Dx Code 366.19  Surgeon: Gemma Payor, M.D.  Assistants: None  Anesthesia: Topical with MAC  Indications: Painless, progressive loss of vision with compromise of daily activities.  Surgery: Cataract Extraction with Intraocular lens Implant Left Eye  Discription: The patient had dilating drops and viscous lidocaine placed into the left eye in the pre-op holding area. After transfer to the operating room, a time out was performed. The patient was then prepped and draped. Beginning with a 75 degree blade a paracentesis port was made at the surgeon's 2 o'clock position. The anterior chamber was then filled with 1% non-preserved lidocaine. This was followed by filling the anterior chamber with Provisc. A bent cystatome needle was used to create a continuous tear capsulotomy. Hydrodissection was performed with balanced salt solution on a Fine canula. The lens nucleus was then removed using the phacoemulsification handpiece. Residual cortex was removed with the I&A handpiece. The anterior chamber and capsular bag were refilled with Provisc. A posterior chamber intraocular lens was placed into the capsular bag with it's injector. The implant was positioned with the Kuglan hook. The Provisc was then removed from the anterior chamber and capsular bag with the I&A handpiece. Stromal hydration of the main incision and paracentesis port was performed with BSS on a Fine canula. The wounds were tested for leak which was negative. The patient tolerated the procedure well. There were no operative complications. The patient was then transferred to the recovery room in stable condition.  Complications: None  Specimen: None  EBL: None  Prosthetic device: B&L enVista, MX60, power 14.5D, #[id number].

## 2012-10-07 NOTE — Anesthesia Postprocedure Evaluation (Signed)
  Anesthesia Post-op Note  Patient: Jason Johnson  Procedure(s) Performed: Procedure(s) with comments: CATARACT EXTRACTION PHACO AND INTRAOCULAR LENS PLACEMENT (IOC) (Left) - CDE:12.24  Patient Location: Short Stay  Anesthesia Type:MAC  Level of Consciousness: awake, alert , oriented and patient cooperative  Airway and Oxygen Therapy: Patient Spontanous Breathing  Post-op Pain: none  Post-op Assessment: Post-op Vital signs reviewed, Patient's Cardiovascular Status Stable, Respiratory Function Stable and Pain level controlled  Post-op Vital Signs: Reviewed and stable  Complications: No apparent anesthesia complications

## 2012-10-07 NOTE — Anesthesia Preprocedure Evaluation (Signed)
Anesthesia Evaluation  Patient identified by MRN, date of birth, ID band Patient awake    Reviewed: Allergy & Precautions, H&P , NPO status , Patient's Chart, lab work & pertinent test results  Airway Mallampati: II TM Distance: >3 FB     Dental  (+) Teeth Intact   Pulmonary neg pulmonary ROS,  breath sounds clear to auscultation        Cardiovascular hypertension, Pt. on medications + CAD, + Past MI and + CABG Rhythm:Regular Rate:Normal     Neuro/Psych    GI/Hepatic negative GI ROS,   Endo/Other    Renal/GU      Musculoskeletal   Abdominal   Peds  Hematology   Anesthesia Other Findings   Reproductive/Obstetrics                           Anesthesia Physical Anesthesia Plan  ASA: III  Anesthesia Plan: MAC   Post-op Pain Management:    Induction: Intravenous  Airway Management Planned: Nasal Cannula  Additional Equipment:   Intra-op Plan:   Post-operative Plan:   Informed Consent: I have reviewed the patients History and Physical, chart, labs and discussed the procedure including the risks, benefits and alternatives for the proposed anesthesia with the patient or authorized representative who has indicated his/her understanding and acceptance.     Plan Discussed with:   Anesthesia Plan Comments:         Anesthesia Quick Evaluation

## 2012-10-11 ENCOUNTER — Encounter (HOSPITAL_COMMUNITY): Payer: Self-pay | Admitting: Ophthalmology

## 2012-11-18 DIAGNOSIS — M5430 Sciatica, unspecified side: Secondary | ICD-10-CM | POA: Diagnosis not present

## 2012-11-18 DIAGNOSIS — Z6828 Body mass index (BMI) 28.0-28.9, adult: Secondary | ICD-10-CM | POA: Diagnosis not present

## 2012-12-23 DIAGNOSIS — I10 Essential (primary) hypertension: Secondary | ICD-10-CM | POA: Diagnosis not present

## 2012-12-23 DIAGNOSIS — Z125 Encounter for screening for malignant neoplasm of prostate: Secondary | ICD-10-CM | POA: Diagnosis not present

## 2012-12-23 DIAGNOSIS — E785 Hyperlipidemia, unspecified: Secondary | ICD-10-CM | POA: Diagnosis not present

## 2012-12-23 DIAGNOSIS — Z6829 Body mass index (BMI) 29.0-29.9, adult: Secondary | ICD-10-CM | POA: Diagnosis not present

## 2012-12-23 DIAGNOSIS — I2510 Atherosclerotic heart disease of native coronary artery without angina pectoris: Secondary | ICD-10-CM | POA: Diagnosis not present

## 2012-12-23 DIAGNOSIS — Z79899 Other long term (current) drug therapy: Secondary | ICD-10-CM | POA: Diagnosis not present

## 2013-01-12 DIAGNOSIS — Z23 Encounter for immunization: Secondary | ICD-10-CM | POA: Diagnosis not present

## 2013-02-02 ENCOUNTER — Telehealth (HOSPITAL_COMMUNITY): Payer: Self-pay | Admitting: *Deleted

## 2013-03-16 ENCOUNTER — Telehealth (HOSPITAL_COMMUNITY): Payer: Self-pay | Admitting: *Deleted

## 2013-07-06 ENCOUNTER — Encounter: Payer: Self-pay | Admitting: Cardiovascular Disease

## 2013-07-06 ENCOUNTER — Ambulatory Visit (INDEPENDENT_AMBULATORY_CARE_PROVIDER_SITE_OTHER): Payer: Medicare Other | Admitting: Cardiovascular Disease

## 2013-07-06 VITALS — BP 128/68 | HR 78 | Ht 72.0 in | Wt 211.0 lb

## 2013-07-06 DIAGNOSIS — I10 Essential (primary) hypertension: Secondary | ICD-10-CM | POA: Diagnosis not present

## 2013-07-06 DIAGNOSIS — I739 Peripheral vascular disease, unspecified: Secondary | ICD-10-CM

## 2013-07-06 DIAGNOSIS — I2510 Atherosclerotic heart disease of native coronary artery without angina pectoris: Secondary | ICD-10-CM | POA: Diagnosis not present

## 2013-07-06 DIAGNOSIS — E782 Mixed hyperlipidemia: Secondary | ICD-10-CM | POA: Diagnosis not present

## 2013-07-06 DIAGNOSIS — I779 Disorder of arteries and arterioles, unspecified: Secondary | ICD-10-CM

## 2013-07-06 DIAGNOSIS — Z79899 Other long term (current) drug therapy: Secondary | ICD-10-CM | POA: Diagnosis not present

## 2013-07-06 DIAGNOSIS — E785 Hyperlipidemia, unspecified: Secondary | ICD-10-CM

## 2013-07-06 MED ORDER — CLOPIDOGREL BISULFATE 75 MG PO TABS: 75.0000 mg | ORAL_TABLET | Freq: Every day | ORAL | Status: DC

## 2013-07-06 MED ORDER — SIMVASTATIN 40 MG PO TABS: 40.0000 mg | ORAL_TABLET | Freq: Every evening | ORAL | Status: DC

## 2013-07-06 MED ORDER — CARVEDILOL 12.5 MG PO TABS: 12.5000 mg | ORAL_TABLET | Freq: Two times a day (BID) | ORAL | Status: DC

## 2013-07-06 MED ORDER — NITROGLYCERIN 0.4 MG SL SUBL: 0.4000 mg | SUBLINGUAL_TABLET | SUBLINGUAL | Status: DC | PRN

## 2013-07-06 NOTE — Assessment & Plan Note (Signed)
Status post coronary artery bypass grafting in 1999 by Dr. Julaine FusiPaul Corso at [facility]. 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 He underwent AngioJet mechanical aspiration thrombectomy, angioplasty and stenting of that graft and has done well since. His last Myoview stress test performed 10/08/10 showed a subtle area of inferior ischemia involving the septum towards the base with some mild peri-infarct ischemia. He does get occasional effort angina which is stable.

## 2013-07-06 NOTE — Assessment & Plan Note (Signed)
On statin therapy. We'll recheck a lipid and liver profile 

## 2013-07-06 NOTE — Patient Instructions (Addendum)
Your physician has requested that you have a carotid duplex. This test is an ultrasound of the carotid arteries in your neck. It looks at blood flow through these arteries that supply the brain with blood. Allow one hour for this exam. There are no restrictions or special instructions.  Dr Allyson SabalBerry has ordered you to have some blood work - to be DONE FASTING.  Your prescriptions have been sent into your pharmacy electronically - for 1 year.  Dr Allyson SabalBerry wants you to follow-up in 1 year. You will receive a reminder letter in the mail two months in advance. If you don't receive a letter, please call our office to schedule the follow-up appointment.

## 2013-07-06 NOTE — Assessment & Plan Note (Signed)
Well-controlled on current medications 

## 2013-07-06 NOTE — Assessment & Plan Note (Signed)
Moderately severe right, mild to moderate left ICA stenosis. We fell this by duplex ultrasound on an annual basis. He is on aspirin and Plavix. He is neurologically asymptomatic.

## 2013-07-06 NOTE — Progress Notes (Signed)
07/06/2013 Scherrie Bateman   1940-10-31  [id number]  Primary Physician Cassell Smiles., MD Primary Cardiologist: Runell Gess MD Roseanne Reno   HPI:  The patient is a 73 year old, mildly overweight, married Caucasian male, father to 2, (father to 3 stepchildren,) grandfather to 5 great-grandchildren who I last saw in the office 6 months ago. He has a history of CAD status post coronary artery bypass grafting by Dr. Julaine Fusi at the [facility] in [place], [place] back in 1999. His risk factors include hypertension and hyperlipidemia. 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 He underwent AngioJet mechanical aspiration thrombectomy, angioplasty and stenting of that graft and has done well since. He denies chest pain or shortness of breath. He had a Myoview performed in our office October 08, 2010, that was remarkable for a very subtle area of inferior ischemia involving inferior septum towards the base with some inferior scar with some mild peri-infarct ischemia. His lipid profile performed 11/12/11 revealed total cholesterol 153, LDL 89, HDL of 46. He had carotid Dopplers performed in our office December 23, 2011, that showed moderate right ICA stenosis which we have been following by serial duplex ultrasounds. He is neurologically asymptomatic on aspirin and Plavix.     Current Outpatient Prescriptions  Medication Sig Dispense Refill  . aspirin EC 81 MG tablet Take 162 mg by mouth daily.      . carvedilol (COREG) 12.5 MG tablet Take 1 tablet (12.5 mg total) by mouth 2 (two) times daily with a meal.  730 tablet  0  . clopidogrel (PLAVIX) 75 MG tablet Take 1 tablet (75 mg total) by mouth daily.  365 tablet  0  . Coenzyme Q10 (CO Q-10) 100 MG CAPS Take 100 mg by mouth daily.      . nitroGLYCERIN (NITROSTAT) 0.4 MG SL tablet Place 1 tablet (0.4 mg total) under the tongue every 5 (five)  minutes as needed for chest pain.  25 tablet  4  . simvastatin (ZOCOR) 40 MG tablet Take 1 tablet (40 mg total) by mouth every evening.  365 tablet  0   No current facility-administered medications for this visit.    Allergies  Allergen Reactions  . Levaquin [Levofloxacin] Itching and Rash  . Levaquin [Levofloxacin In D5w]     History   Social History  . Marital Status: Married    Spouse Name: N/A    Number of Children: N/A  . Years of Education: N/A   Occupational History  . Not on file.   Social History Main Topics  . Smoking status: Never Smoker   . Smokeless tobacco: Not on file  . Alcohol Use: Yes     Comment: Occasionally  . Drug Use: No  . Sexual Activity: Not on file   Other Topics Concern  . Not on file   Social History Narrative  . No narrative on file     Review of Systems: General: negative for chills, fever, night sweats or weight changes.  Cardiovascular: negative for chest pain, dyspnea on exertion, edema, orthopnea, palpitations, paroxysmal nocturnal dyspnea or shortness of breath Dermatological: negative for rash Respiratory: negative for cough or wheezing Urologic: negative for hematuria Abdominal: negative for nausea, vomiting, diarrhea, bright red blood per rectum, melena, or hematemesis Neurologic: negative for visual changes, syncope, or dizziness All other systems reviewed and are otherwise negative except as noted above.    Blood pressure 128/68, pulse  78, height 6' (1.829 m), weight 211 lb (95.709 kg).  General appearance: alert and no distress Neck: no adenopathy, no JVD, supple, symmetrical, trachea midline, thyroid not enlarged, symmetric, no tenderness/mass/nodules and soft right carotid bruit Lungs: clear to auscultation bilaterally Heart: regular rate and rhythm, S1, S2 normal, no murmur, click, rub or gallop Extremities: extremities normal, atraumatic, no cyanosis or edema  EKG normal sinus rhythm at 78 with nonspecific ST and T  wave changes  ASSESSMENT AND PLAN:   Coronary artery disease Status post coronary artery bypass grafting in 1999 by Dr. Julaine [facility]Paul Corso at [facility]. 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 He underwent AngioJet mechanical aspiration thrombectomy, angioplasty and stenting of that graft and has done well since. His last Myoview stress test performed 10/08/10 showed a subtle area of inferior ischemia involving the septum towards the base with some mild peri-infarct ischemia. He does get occasional effort angina which is stable.  Carotid artery disease Moderately severe right, mild to moderate left ICA stenosis. We fell this by duplex ultrasound on an annual basis. He is on aspirin and Plavix. He is neurologically asymptomatic.  Essential hypertension Well-controlled on current medications  Hyperlipidemia On statin therapy. We'll recheck a lipid and liver profile      Runell GessJonathan J. Berry MD [facility] CenterFACP,FACC,FAHA, [facility] 07/06/2013 10:45 AM

## 2013-07-12 ENCOUNTER — Ambulatory Visit (HOSPITAL_COMMUNITY)
Admission: RE | Admit: 2013-07-12 | Discharge: 2013-07-12 | Disposition: A | Discharge status: Home / Self Care | Payer: Medicare Other | Source: Ambulatory Visit | Attending: Internal Medicine | Admitting: Internal Medicine

## 2013-07-12 DIAGNOSIS — I779 Disorder of arteries and arterioles, unspecified: Secondary | ICD-10-CM | POA: Diagnosis not present

## 2013-07-12 DIAGNOSIS — I739 Peripheral vascular disease, unspecified: Secondary | ICD-10-CM

## 2013-07-12 NOTE — Progress Notes (Signed)
Carotid Duplex Completed. °Brianna L Mazza,RVT °

## 2013-07-13 DIAGNOSIS — Z79899 Other long term (current) drug therapy: Secondary | ICD-10-CM | POA: Diagnosis not present

## 2013-07-13 DIAGNOSIS — E782 Mixed hyperlipidemia: Secondary | ICD-10-CM | POA: Diagnosis not present

## 2013-07-13 LAB — HEPATIC FUNCTION PANEL
ALT: 15 U/L (ref 0–53)
AST: 18 U/L (ref 0–37)
Albumin: 3.7 g/dL (ref 3.5–5.2)
Alkaline Phosphatase: 50 U/L (ref 39–117)
BILIRUBIN DIRECT: 0.2 mg/dL (ref 0.0–0.3)
BILIRUBIN TOTAL: 0.9 mg/dL (ref 0.2–1.2)
Indirect Bilirubin: 0.7 mg/dL (ref 0.2–1.2)
Total Protein: 6.5 g/dL (ref 6.0–8.3)

## 2013-07-13 LAB — LIPID PANEL
CHOL/HDL RATIO: 3.4 ratio
Cholesterol: 169 mg/dL (ref 0–200)
HDL: 49 mg/dL (ref >39)
LDL Cholesterol: 99 mg/dL (ref 0–99)
Triglycerides: 105 mg/dL (ref <150)
VLDL: 21 mg/dL (ref 0–40)

## 2013-07-20 ENCOUNTER — Telehealth: Payer: Self-pay | Admitting: *Deleted

## 2013-07-20 DIAGNOSIS — E782 Mixed hyperlipidemia: Secondary | ICD-10-CM

## 2013-07-20 DIAGNOSIS — Z79899 Other long term (current) drug therapy: Secondary | ICD-10-CM

## 2013-07-20 MED ORDER — ATORVASTATIN CALCIUM 40 MG PO TABS: 40.0000 mg | ORAL_TABLET | Freq: Every day | ORAL | Status: DC

## 2013-07-20 NOTE — Telephone Encounter (Signed)
Reviewed results with patient.  He requested the prescription to be mailed to him.  RX mailed to him with order for repeat blood work. Patient verbalized understanding to take atorvastatin, stop simvastatin,  and then recheck blood work.

## 2013-07-20 NOTE — Telephone Encounter (Signed)
Message copied by Marella BileVOGEL, Yazmen Briones W. on Wed Jul 20, 2013  5:06 PM ------      Message from: Runell GessBERRY, JONATHAN J      Created: Mon Jul 18, 2013  7:22 PM       Change to atorvastatin 40 mg and re check. Not at goal on Simva 40 with CAD ------

## 2013-07-21 ENCOUNTER — Telehealth: Payer: Self-pay | Admitting: *Deleted

## 2013-07-21 ENCOUNTER — Encounter: Payer: Self-pay | Admitting: *Deleted

## 2013-07-21 DIAGNOSIS — I6529 Occlusion and stenosis of unspecified carotid artery: Secondary | ICD-10-CM

## 2013-07-21 DIAGNOSIS — Z6829 Body mass index (BMI) 29.0-29.9, adult: Secondary | ICD-10-CM | POA: Diagnosis not present

## 2013-07-21 DIAGNOSIS — J069 Acute upper respiratory infection, unspecified: Secondary | ICD-10-CM | POA: Diagnosis not present

## 2013-07-21 NOTE — Telephone Encounter (Signed)
Message copied by Marella BileVOGEL, Lema Heinkel W. on Thu Jul 21, 2013 12:47 PM ------      Message from: Runell GessBERRY, JONATHAN J      Created: Sat Jul 16, 2013 10:01 AM       No change from prior study. Repeat in 6 months ------

## 2013-07-21 NOTE — Telephone Encounter (Signed)
Order placed for repeat carotid dopplers in 6 months  

## 2013-12-22 DIAGNOSIS — Z6828 Body mass index (BMI) 28.0-28.9, adult: Secondary | ICD-10-CM | POA: Diagnosis not present

## 2013-12-22 DIAGNOSIS — R42 Dizziness and giddiness: Secondary | ICD-10-CM | POA: Diagnosis not present

## 2013-12-22 DIAGNOSIS — J0100 Acute maxillary sinusitis, unspecified: Secondary | ICD-10-CM | POA: Diagnosis not present

## 2014-01-06 ENCOUNTER — Telehealth (HOSPITAL_COMMUNITY): Payer: Self-pay | Admitting: *Deleted

## 2014-01-18 ENCOUNTER — Other Ambulatory Visit (HOSPITAL_COMMUNITY): Payer: Self-pay | Admitting: Cardiovascular Disease

## 2014-01-20 DIAGNOSIS — Z23 Encounter for immunization: Secondary | ICD-10-CM | POA: Diagnosis not present

## 2014-07-14 DIAGNOSIS — Z0000 Encounter for general adult medical examination without abnormal findings: Secondary | ICD-10-CM | POA: Diagnosis not present

## 2014-07-14 DIAGNOSIS — Z125 Encounter for screening for malignant neoplasm of prostate: Secondary | ICD-10-CM | POA: Diagnosis not present

## 2014-07-14 DIAGNOSIS — E782 Mixed hyperlipidemia: Secondary | ICD-10-CM | POA: Diagnosis not present

## 2014-07-19 ENCOUNTER — Encounter: Payer: Self-pay | Admitting: Cardiovascular Disease

## 2014-07-25 DIAGNOSIS — E663 Overweight: Secondary | ICD-10-CM | POA: Diagnosis not present

## 2014-07-25 DIAGNOSIS — E782 Mixed hyperlipidemia: Secondary | ICD-10-CM | POA: Diagnosis not present

## 2014-07-25 DIAGNOSIS — Z0000 Encounter for general adult medical examination without abnormal findings: Secondary | ICD-10-CM | POA: Diagnosis not present

## 2014-07-25 DIAGNOSIS — Z6829 Body mass index (BMI) 29.0-29.9, adult: Secondary | ICD-10-CM | POA: Diagnosis not present

## 2014-08-31 ENCOUNTER — Encounter: Payer: Self-pay | Admitting: *Deleted

## 2014-09-08 ENCOUNTER — Encounter: Payer: Self-pay | Admitting: Cardiovascular Disease

## 2014-09-08 ENCOUNTER — Ambulatory Visit (INDEPENDENT_AMBULATORY_CARE_PROVIDER_SITE_OTHER): Payer: Medicare Other | Admitting: Cardiovascular Disease

## 2014-09-08 VITALS — BP 120/60 | HR 86 | Ht 70.0 in | Wt 209.5 lb

## 2014-09-08 DIAGNOSIS — I6529 Occlusion and stenosis of unspecified carotid artery: Secondary | ICD-10-CM | POA: Diagnosis not present

## 2014-09-08 DIAGNOSIS — I2510 Atherosclerotic heart disease of native coronary artery without angina pectoris: Secondary | ICD-10-CM | POA: Diagnosis not present

## 2014-09-08 DIAGNOSIS — I10 Essential (primary) hypertension: Secondary | ICD-10-CM | POA: Diagnosis not present

## 2014-09-08 DIAGNOSIS — I2583 Coronary atherosclerosis due to lipid rich plaque: Secondary | ICD-10-CM

## 2014-09-08 MED ORDER — NITROGLYCERIN 0.4 MG SL SUBL: 0.4000 mg | SUBLINGUAL_TABLET | SUBLINGUAL | Status: DC | PRN

## 2014-09-08 MED ORDER — CLOPIDOGREL BISULFATE 75 MG PO TABS: 75.0000 mg | ORAL_TABLET | Freq: Every day | ORAL | Status: DC

## 2014-09-08 MED ORDER — CARVEDILOL 12.5 MG PO TABS: 12.5000 mg | ORAL_TABLET | Freq: Two times a day (BID) | ORAL | Status: DC

## 2014-09-08 MED ORDER — SIMVASTATIN 40 MG PO TABS: 40.0000 mg | ORAL_TABLET | Freq: Every day | ORAL | Status: DC

## 2014-09-08 NOTE — Assessment & Plan Note (Signed)
History of hypertension blood pressure measured at 120/60. He is on carvedilol. Continue current meds at current dosing

## 2014-09-08 NOTE — Patient Instructions (Addendum)
Your physician wants you to follow-up in: 1 year with Dr Berry. You will receive a reminder letter in the mail two months in advance. If you don't receive a letter, please call our office to schedule the follow-up appointment.   Carotid Duplex- This test is an ultrasound of the carotid arteries in your neck. It looks at blood flow through these arteries that supply the brain with blood. Allow one hour for this exam. There are no restrictions or special instructions.  

## 2014-09-08 NOTE — Assessment & Plan Note (Signed)
History of carotid artery disease with recent Dopplers performed a year ago revealing moderately severe right and moderate left ICA stenosis. We will repeat check carotid Doppler studies

## 2014-09-08 NOTE — Progress Notes (Signed)
09/08/2014 Scherrie Bateman   14-Apr-1940  [id number]  Primary Physician Cassell Smiles., MD Primary Cardiologist: Runell Gess MD Roseanne Reno   HPI:   The patient is a 74 year old, mildly overweight, married Caucasian male, father to 2, (father to 3 stepchildren,) grandfather to 5 great-grandchildren who I last saw in the office 12 months ago. He has a history of CAD status post coronary artery bypass grafting by Dr. Julaine Fusi at the [facility] in [place], [place] back in 1999. His risk factors include hypertension and hyperlipidemia. 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 He underwent AngioJet mechanical aspiration thrombectomy, angioplasty and stenting of that graft and has done well since. He denies chest pain or shortness of breath. He had a Myoview performed in our office October 08, 2010, that was remarkable for a very subtle area of inferior ischemia involving inferior septum towards the base with some inferior scar with some mild peri-infarct ischemia. His lipid profile performed 07/14/14 revealed total cholesterol of 174, LDL 92 and HDL of 56. He had carotid Dopplers performed in our office 07/12/13, that showed moderately severe right and moderate left ICA stenosis which we have been following by serial duplex ultrasounds. He is neurologically asymptomatic on aspirin and Plavix.   Current Outpatient Prescriptions  Medication Sig Dispense Refill  . aspirin EC 81 MG tablet Take 162 mg by mouth daily.    . carvedilol (COREG) 12.5 MG tablet Take 1 tablet (12.5 mg total) by mouth 2 (two) times daily with a meal. 730 tablet 0  . clopidogrel (PLAVIX) 75 MG tablet Take 1 tablet (75 mg total) by mouth daily. 365 tablet 0  . nitroGLYCERIN (NITROSTAT) 0.4 MG SL tablet Place 1 tablet (0.4 mg total) under the tongue every 5 (five) minutes as needed for chest pain. 25 tablet 4  .  simvastatin (ZOCOR) 40 MG tablet Take 40 mg by mouth daily.     No current facility-administered medications for this visit.    Allergies  Allergen Reactions  . Levaquin [Levofloxacin] Itching and Rash  . Levaquin [Levofloxacin In D5w]     History   Social History  . Marital Status: Married    Spouse Name: N/A  . Number of Children: N/A  . Years of Education: N/A   Occupational History  . Not on file.   Social History Main Topics  . Smoking status: Never Smoker   . Smokeless tobacco: Not on file  . Alcohol Use: Yes     Comment: Occasionally  . Drug Use: No  . Sexual Activity: Not on file   Other Topics Concern  . Not on file   Social History Narrative     Review of Systems: General: negative for chills, fever, night sweats or weight changes.  Cardiovascular: negative for chest pain, dyspnea on exertion, edema, orthopnea, palpitations, paroxysmal nocturnal dyspnea or shortness of breath Dermatological: negative for rash Respiratory: negative for cough or wheezing Urologic: negative for hematuria Abdominal: negative for nausea, vomiting, diarrhea, bright red blood per rectum, melena, or hematemesis Neurologic: negative for visual changes, syncope, or dizziness All other systems reviewed and are otherwise negative except as noted above.    Blood pressure 120/60, pulse 86, height  (1.778 m), weight 209 lb 8 oz (95.029 kg).  General appearance: alert and no distress Neck: no adenopathy, no carotid bruit, no JVD, supple, symmetrical, trachea midline and thyroid not enlarged, symmetric, no  tenderness/mass/nodules Lungs: clear to auscultation bilaterally Heart: regular rate and rhythm, S1, S2 normal, no murmur, click, rub or gallop Extremities: extremities normal, atraumatic, no cyanosis or edema  EKG normal sinus rhythm 86 with nonspecific ST-T wave changes. I personally reviewed this EKG  ASSESSMENT AND PLAN:   Hyperlipidemia History of hyperlipidemia on  some statin 40 mg a day with recent lipid profile performed 07/14/14 revealed a total cholesterol 174, LDL 92 and HDL of 56   Essential hypertension History of hypertension blood pressure measured at 120/60. He is on carvedilol. Continue current meds at current dosing   Coronary artery disease History of coronary artery disease status post coronary artery bypass grafting by Dr. Julaine [facility]Paul Corso at [facility] in [place] back in 1999. 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 He underwent AngioJet mechanical aspiration thrombectomy, angioplasty and stenting of the graft and has done well since. A Myoview performed in our office 10/08/10 showed very subtle inferior ischemia involving the inferior septum towards the base, some some inferior scar with mild peri-infarct ischemia. He denies shortness of breath and does have mild stable exertional angina.   Carotid artery disease History of carotid artery disease with recent Dopplers performed a year ago revealing moderately severe right and moderate left ICA stenosis. We will repeat check carotid Doppler studies       Runell GessJonathan J. Berry MD [facility] HospitalFACP,FACC,FAHA, [facility] 09/08/2014 12:12 PM

## 2014-09-08 NOTE — Assessment & Plan Note (Signed)
History of hyperlipidemia on some statin 40 mg a day with recent lipid profile performed 07/14/14 revealed a total cholesterol 174, LDL 92 and HDL of 56

## 2014-09-08 NOTE — Assessment & Plan Note (Signed)
History of coronary artery disease status post coronary artery bypass grafting by Dr. Julaine [facility]Paul Corso at the [facility] in [place] back in 1999. 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 He underwent AngioJet mechanical aspiration thrombectomy, angioplasty and stenting of the graft and has done well since. A Myoview performed in our office 10/08/10 showed very subtle inferior ischemia involving the inferior septum towards the base, some some inferior scar with mild peri-infarct ischemia. He denies shortness of breath and does have mild stable exertional angina.

## 2014-09-12 ENCOUNTER — Encounter: Payer: Self-pay | Admitting: Cardiovascular Disease

## 2014-09-20 ENCOUNTER — Ambulatory Visit (HOSPITAL_COMMUNITY)
Admission: RE | Admit: 2014-09-20 | Discharge: 2014-09-20 | Disposition: A | Discharge status: Home / Self Care | Payer: Medicare Other | Source: Ambulatory Visit | Attending: Cardiovascular Disease | Admitting: Cardiovascular Disease

## 2014-09-20 DIAGNOSIS — I6529 Occlusion and stenosis of unspecified carotid artery: Secondary | ICD-10-CM | POA: Diagnosis not present

## 2014-09-20 DIAGNOSIS — I6523 Occlusion and stenosis of bilateral carotid arteries: Secondary | ICD-10-CM | POA: Insufficient documentation

## 2014-09-27 NOTE — Progress Notes (Signed)
With the degree of stenosis that he has six-month follow-up is appropriate

## 2015-02-07 DIAGNOSIS — Z23 Encounter for immunization: Secondary | ICD-10-CM | POA: Diagnosis not present

## 2015-04-11 DIAGNOSIS — Z1389 Encounter for screening for other disorder: Secondary | ICD-10-CM | POA: Diagnosis not present

## 2015-04-11 DIAGNOSIS — E663 Overweight: Secondary | ICD-10-CM | POA: Diagnosis not present

## 2015-04-11 DIAGNOSIS — I2510 Atherosclerotic heart disease of native coronary artery without angina pectoris: Secondary | ICD-10-CM | POA: Diagnosis not present

## 2015-04-11 DIAGNOSIS — Z6829 Body mass index (BMI) 29.0-29.9, adult: Secondary | ICD-10-CM | POA: Diagnosis not present

## 2015-04-11 DIAGNOSIS — J069 Acute upper respiratory infection, unspecified: Secondary | ICD-10-CM | POA: Diagnosis not present

## 2015-06-18 DIAGNOSIS — R070 Pain in throat: Secondary | ICD-10-CM | POA: Diagnosis not present

## 2015-06-18 DIAGNOSIS — Z6829 Body mass index (BMI) 29.0-29.9, adult: Secondary | ICD-10-CM | POA: Diagnosis not present

## 2015-06-18 DIAGNOSIS — J019 Acute sinusitis, unspecified: Secondary | ICD-10-CM | POA: Diagnosis not present

## 2015-06-18 DIAGNOSIS — Z1389 Encounter for screening for other disorder: Secondary | ICD-10-CM | POA: Diagnosis not present

## 2015-06-18 DIAGNOSIS — E663 Overweight: Secondary | ICD-10-CM | POA: Diagnosis not present

## 2015-06-18 DIAGNOSIS — J343 Hypertrophy of nasal turbinates: Secondary | ICD-10-CM | POA: Diagnosis not present

## 2015-06-18 DIAGNOSIS — J069 Acute upper respiratory infection, unspecified: Secondary | ICD-10-CM | POA: Diagnosis not present

## 2015-09-12 DIAGNOSIS — Z1389 Encounter for screening for other disorder: Secondary | ICD-10-CM | POA: Diagnosis not present

## 2015-09-12 DIAGNOSIS — Z6828 Body mass index (BMI) 28.0-28.9, adult: Secondary | ICD-10-CM | POA: Diagnosis not present

## 2015-09-12 DIAGNOSIS — Z0000 Encounter for general adult medical examination without abnormal findings: Secondary | ICD-10-CM | POA: Diagnosis not present

## 2015-09-13 DIAGNOSIS — Z0000 Encounter for general adult medical examination without abnormal findings: Secondary | ICD-10-CM | POA: Diagnosis not present

## 2015-09-13 DIAGNOSIS — Z1389 Encounter for screening for other disorder: Secondary | ICD-10-CM | POA: Diagnosis not present

## 2015-09-13 DIAGNOSIS — E782 Mixed hyperlipidemia: Secondary | ICD-10-CM | POA: Diagnosis not present

## 2015-09-13 DIAGNOSIS — Z6828 Body mass index (BMI) 28.0-28.9, adult: Secondary | ICD-10-CM | POA: Diagnosis not present

## 2015-09-13 DIAGNOSIS — I2510 Atherosclerotic heart disease of native coronary artery without angina pectoris: Secondary | ICD-10-CM | POA: Diagnosis not present

## 2015-09-13 DIAGNOSIS — Z125 Encounter for screening for malignant neoplasm of prostate: Secondary | ICD-10-CM | POA: Diagnosis not present

## 2015-11-22 DIAGNOSIS — J329 Chronic sinusitis, unspecified: Secondary | ICD-10-CM | POA: Diagnosis not present

## 2015-11-22 DIAGNOSIS — J343 Hypertrophy of nasal turbinates: Secondary | ICD-10-CM | POA: Diagnosis not present

## 2015-11-22 DIAGNOSIS — Z1389 Encounter for screening for other disorder: Secondary | ICD-10-CM | POA: Diagnosis not present

## 2015-11-22 DIAGNOSIS — Z6829 Body mass index (BMI) 29.0-29.9, adult: Secondary | ICD-10-CM | POA: Diagnosis not present

## 2015-11-22 DIAGNOSIS — I10 Essential (primary) hypertension: Secondary | ICD-10-CM | POA: Diagnosis not present

## 2015-11-22 DIAGNOSIS — E782 Mixed hyperlipidemia: Secondary | ICD-10-CM | POA: Diagnosis not present

## 2016-01-29 ENCOUNTER — Ambulatory Visit (INDEPENDENT_AMBULATORY_CARE_PROVIDER_SITE_OTHER): Payer: Medicare Other | Admitting: Cardiovascular Disease

## 2016-01-29 ENCOUNTER — Encounter: Payer: Self-pay | Admitting: Cardiovascular Disease

## 2016-01-29 VITALS — BP 156/80 | HR 70 | Ht 72.0 in | Wt 204.0 lb

## 2016-01-29 DIAGNOSIS — I10 Essential (primary) hypertension: Secondary | ICD-10-CM | POA: Diagnosis not present

## 2016-01-29 DIAGNOSIS — I2510 Atherosclerotic heart disease of native coronary artery without angina pectoris: Secondary | ICD-10-CM

## 2016-01-29 DIAGNOSIS — E7800 Pure hypercholesterolemia, unspecified: Secondary | ICD-10-CM

## 2016-01-29 DIAGNOSIS — I779 Disorder of arteries and arterioles, unspecified: Secondary | ICD-10-CM | POA: Diagnosis not present

## 2016-01-29 DIAGNOSIS — I739 Peripheral vascular disease, unspecified: Principal | ICD-10-CM

## 2016-01-29 MED ORDER — SIMVASTATIN 40 MG PO TABS: 40.0000 mg | ORAL_TABLET | Freq: Every day | ORAL | 3 refills | Status: DC

## 2016-01-29 MED ORDER — NITROGLYCERIN 0.4 MG SL SUBL: 0.4000 mg | SUBLINGUAL_TABLET | SUBLINGUAL | 4 refills | Status: AC | PRN

## 2016-01-29 MED ORDER — CARVEDILOL 12.5 MG PO TABS: 12.5000 mg | ORAL_TABLET | Freq: Two times a day (BID) | ORAL | 3 refills | Status: DC

## 2016-01-29 MED ORDER — CLOPIDOGREL BISULFATE 75 MG PO TABS: 75.0000 mg | ORAL_TABLET | Freq: Every day | ORAL | 3 refills | Status: DC

## 2016-01-29 NOTE — Addendum Note (Signed)
Addended by: Stann MainlandLARK, JENNIFER O on: 01/29/2016 12:26 PM   Modules accepted: Orders

## 2016-01-29 NOTE — Assessment & Plan Note (Signed)
History of hypertension blood pressure today 156/80. He is on carvedilol. Continue current meds at current dosing

## 2016-01-29 NOTE — Patient Instructions (Signed)
Medication Instructions:  Your physician recommends that you continue on your current medications as directed. Please refer to the Current Medication list given to you today.   Testing/Procedures: Your physician has requested that you have a carotid duplex. This test is an ultrasound of the carotid arteries in your neck. It looks at blood flow through these arteries that supply the brain with blood. Allow one hour for this exam. There are no restrictions or special instructions.  Follow-Up: Your physician wants you to follow-up in: 12 months with Dr. Allyson SabalBerry. You will receive a reminder letter in the mail two months in advance. If you don't receive a letter, please call our office to schedule the follow-up appointment.   If you need a refill on your cardiac medications before your next appointment, please call your pharmacy.

## 2016-01-29 NOTE — Addendum Note (Signed)
Addended by: Stann MainlandLARK, JENNIFER O on: 01/29/2016 12:17 PM   Modules accepted: Orders

## 2016-01-29 NOTE — Addendum Note (Signed)
Addended by: Stann MainlandLARK, Virgia Kelner O on: 01/29/2016 12:36 PM   Modules accepted: Orders

## 2016-01-29 NOTE — Assessment & Plan Note (Signed)
History of coronary artery disease status post coronary artery bypass grafting by Dr. Julaine FusiPaul Corso at the [facility] in [place] in 1999. 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 He underwent AngioJet mechanical aspiration thrombectomy, angioplasty and stenting of that graft and has done well since. He had a Myoview performed in our office 10/08/10 that showed subtle inferior ischemia. He is completely asymptomatic.

## 2016-01-29 NOTE — Assessment & Plan Note (Signed)
History of hyperlipidemia on statin therapy with recent lipid profile performed by his PCP 09/13/15 revealed total cholesterol 173, LDL 94 and HDL of 56.

## 2016-01-29 NOTE — Assessment & Plan Note (Signed)
History of carotid artery disease by duplex ultrasound last checked 09/20/14. He had moderately severe right and moderate left ICA stenosis. We will repeat carotid Doppler studies.

## 2016-01-29 NOTE — Progress Notes (Signed)
01/29/2016 Jason Johnson   22-Aug-1940  [id number]  Primary Physician Cassell Smiles., MD Primary Cardiologist: Runell Gess MD Nicholes Calamity, [place]  HPI:  The patient is a 75 year old, mildly overweight, married Caucasian male, father to 2, (father to 3 stepchildren,) grandfather to 5 great-grandchildren who I last saw in the office 09/08/14. He has a history of CAD status post coronary artery bypass grafting by Dr. Julaine Fusi at the [facility] in [place], [place] back in 1999. His risk factors include hypertension and hyperlipidemia. 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 He underwent AngioJet mechanical aspiration thrombectomy, angioplasty and stenting of that graft and has done well since. He denies chest pain or shortness of breath. He had a Myoview performed in our office October 08, 2010, that was remarkable for a very subtle area of inferior ischemia involving inferior septum towards the base with some inferior scar with some mild peri-infarct ischemia. His lipid profile performed 09/13/15 revealed total cholesterol 173, LDL 94 and HDL of 56.Marland Kitchen He had carotid Dopplers performed in our office 07/12/13, that showed moderately severe right and moderate left ICA stenosis which we have been following by serial duplex ultrasounds. He is neurologically asymptomatic on aspirin and Plavix   Current Outpatient Prescriptions  Medication Sig Dispense Refill  . aspirin EC 81 MG tablet Take 162 mg by mouth daily.    . carvedilol (COREG) 12.5 MG tablet Take 1 tablet (12.5 mg total) by mouth 2 (two) times daily with a meal. 180 tablet 3  . clopidogrel (PLAVIX) 75 MG tablet Take 1 tablet (75 mg total) by mouth daily. 90 tablet 3  . nitroGLYCERIN (NITROSTAT) 0.4 MG SL tablet Place 1 tablet (0.4 mg total) under the tongue every 5 (five) minutes as needed for chest pain. 25 tablet 4  . simvastatin (ZOCOR) 40  MG tablet Take 1 tablet (40 mg total) by mouth daily. 90 tablet 3   No current facility-administered medications for this visit.     Allergies  Allergen Reactions  . Levaquin [Levofloxacin] Itching and Rash  . Levaquin [Levofloxacin In D5w]     Social History   Social History  . Marital status: Married    Spouse name: N/A  . Number of children: N/A  . Years of education: N/A   Occupational History  . Not on file.   Social History Main Topics  . Smoking status: Never Smoker  . Smokeless tobacco: Never Used  . Alcohol use Yes     Comment: Occasionally  . Drug use: No  . Sexual activity: Not on file   Other Topics Concern  . Not on file   Social History Narrative  . No narrative on file     Review of Systems: General: negative for chills, fever, night sweats or weight changes.  Cardiovascular: negative for chest pain, dyspnea on exertion, edema, orthopnea, palpitations, paroxysmal nocturnal dyspnea or shortness of breath Dermatological: negative for rash Respiratory: negative for cough or wheezing Urologic: negative for hematuria Abdominal: negative for nausea, vomiting, diarrhea, bright red blood per rectum, melena, or hematemesis Neurologic: negative for visual changes, syncope, or dizziness All other systems reviewed and are otherwise negative except as noted above.    Blood pressure (!) 156/80, pulse 70, height 6' (1.829 m), weight 204 lb (92.5 kg).  General appearance: alert and no distress Neck: no adenopathy, no carotid bruit, no JVD, supple, symmetrical, trachea midline and  thyroid not enlarged, symmetric, no tenderness/mass/nodules Lungs: clear to auscultation bilaterally Heart: regular rate and rhythm, S1, S2 normal, no murmur, click, rub or gallop Extremities: extremities normal, atraumatic, no cyanosis or edema  EKG normal sinus rhythm at 70 with nonspecific ST and T-wave changes. I personally reviewed this EKG  ASSESSMENT AND PLAN:   Coronary  artery disease History of coronary artery disease status post coronary artery bypass grafting by [facility]. [facility]Paul Corso [facility] [place] in [place]. 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 He underwent AngioJet mechanical aspiration thrombectomy, angioplasty and stenting of that graft and has done well since. He had a Myoview performed in our office 10/08/10 that showed subtle inferior ischemia. He is completely asymptomatic.  Carotid artery disease History of carotid artery disease by duplex ultrasound last checked 09/20/14. He had moderately severe right and moderate left ICA stenosis. We will repeat carotid Doppler studies.  Essential hypertension History of hypertension blood pressure today 156/80. He is on carvedilol. Continue current meds at current dosing  Hyperlipidemia History of hyperlipidemia on statin therapy with recent lipid profile performed by his PCP 09/13/15 revealed total cholesterol 173, LDL 94 and HDL of 56.      Runell GessJonathan J. Berry MD FACP,FACC,FAHA, [facility] 01/29/2016 12:12 PM

## 2016-01-31 ENCOUNTER — Ambulatory Visit (HOSPITAL_COMMUNITY)
Admission: RE | Admit: 2016-01-31 | Discharge: 2016-01-31 | Disposition: A | Discharge status: Home / Self Care | Payer: Medicare Other | Source: Ambulatory Visit | Attending: Cardiovascular Disease | Admitting: Cardiovascular Disease

## 2016-01-31 DIAGNOSIS — I779 Disorder of arteries and arterioles, unspecified: Secondary | ICD-10-CM | POA: Diagnosis present

## 2016-01-31 DIAGNOSIS — I10 Essential (primary) hypertension: Secondary | ICD-10-CM | POA: Diagnosis not present

## 2016-01-31 DIAGNOSIS — I6523 Occlusion and stenosis of bilateral carotid arteries: Secondary | ICD-10-CM | POA: Insufficient documentation

## 2016-01-31 DIAGNOSIS — I739 Peripheral vascular disease, unspecified: Secondary | ICD-10-CM

## 2016-02-01 ENCOUNTER — Telehealth: Payer: Self-pay | Admitting: *Deleted

## 2016-02-01 DIAGNOSIS — I779 Disorder of arteries and arterioles, unspecified: Secondary | ICD-10-CM

## 2016-02-01 DIAGNOSIS — I739 Peripheral vascular disease, unspecified: Principal | ICD-10-CM

## 2016-02-01 NOTE — Telephone Encounter (Signed)
-----   Message from Runell GessJonathan J Berry, MD sent at 01/31/2016  8:46 PM EDT ----- Mod Bilat ICA stenosis. Repeat 12 months

## 2016-02-05 DIAGNOSIS — Z23 Encounter for immunization: Secondary | ICD-10-CM | POA: Diagnosis not present

## 2016-02-07 ENCOUNTER — Encounter (HOSPITAL_COMMUNITY): Payer: Medicare Other

## 2016-07-21 DIAGNOSIS — K625 Hemorrhage of anus and rectum: Secondary | ICD-10-CM | POA: Diagnosis not present

## 2016-07-21 DIAGNOSIS — Z6829 Body mass index (BMI) 29.0-29.9, adult: Secondary | ICD-10-CM | POA: Diagnosis not present

## 2016-07-21 DIAGNOSIS — E663 Overweight: Secondary | ICD-10-CM | POA: Diagnosis not present

## 2016-07-21 DIAGNOSIS — Z1389 Encounter for screening for other disorder: Secondary | ICD-10-CM | POA: Diagnosis not present

## 2016-12-21 IMAGING — US US CAROTID DUPLEX BILAT
1 series · 13 of 24 positions shown · non-contrast
Comparison: No prior.

CLINICAL DATA: Bilateral carotid artery disease.

EXAM:
BILATERAL CAROTID DUPLEX ULTRASOUND
TECHNIQUE: Gray scale imaging, color Doppler and duplex ultrasound were
performed of bilateral carotid and vertebral arteries in the neck.

[Series 1: us carotid duplex bilat · 0.06mm/px · 13 of 68 slices shown]
[im 1/68]
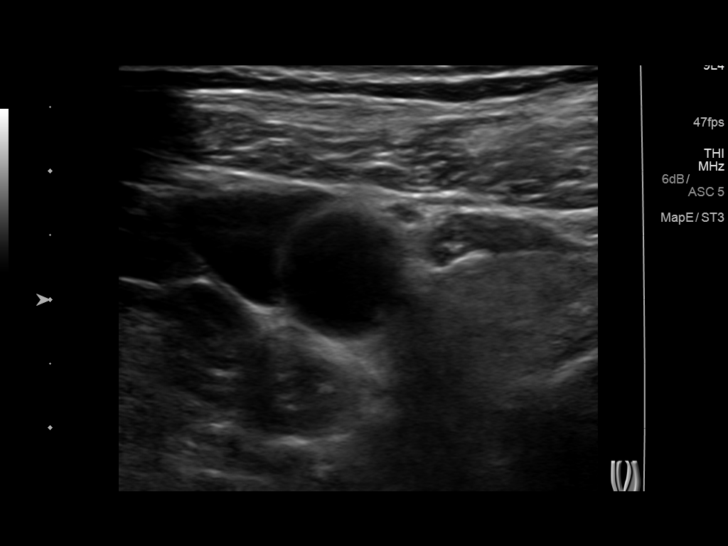
[im 6/68]
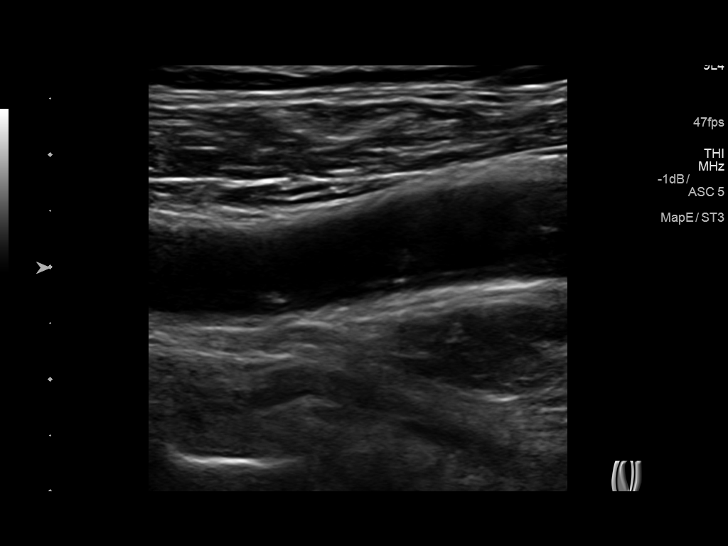
[im 12/68]
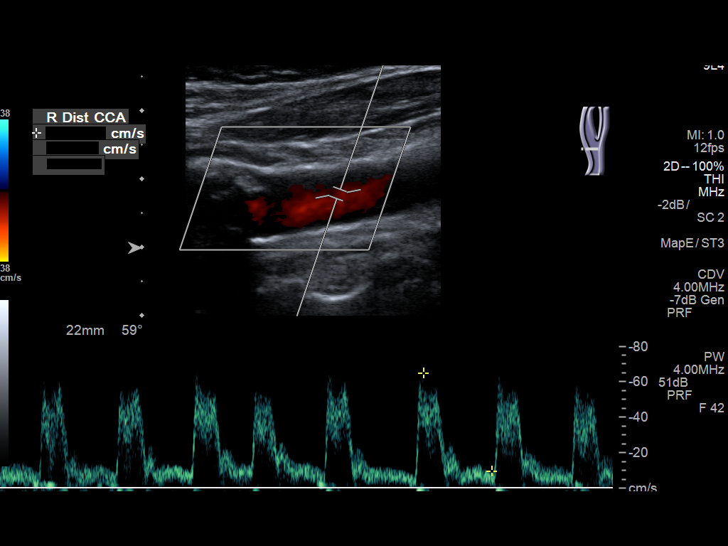
[im 18/68]
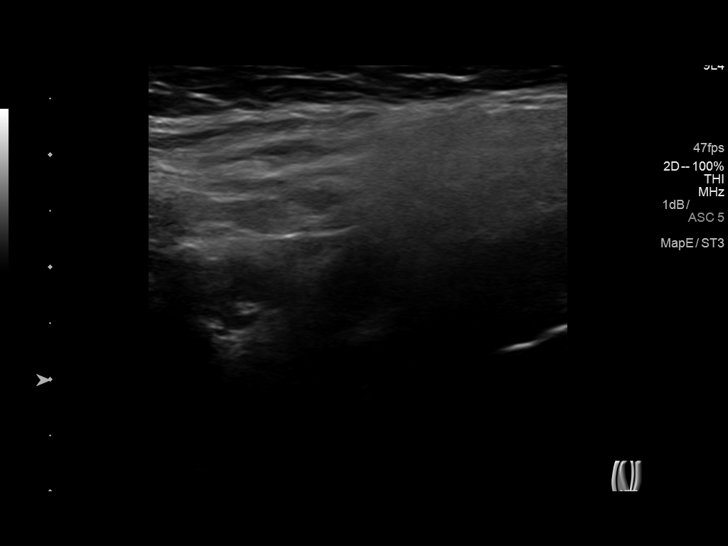
[im 24/68]
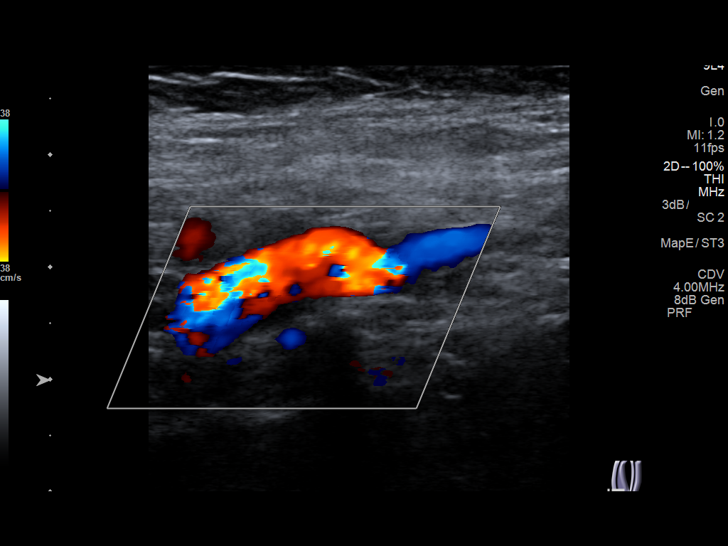
[im 30/68]
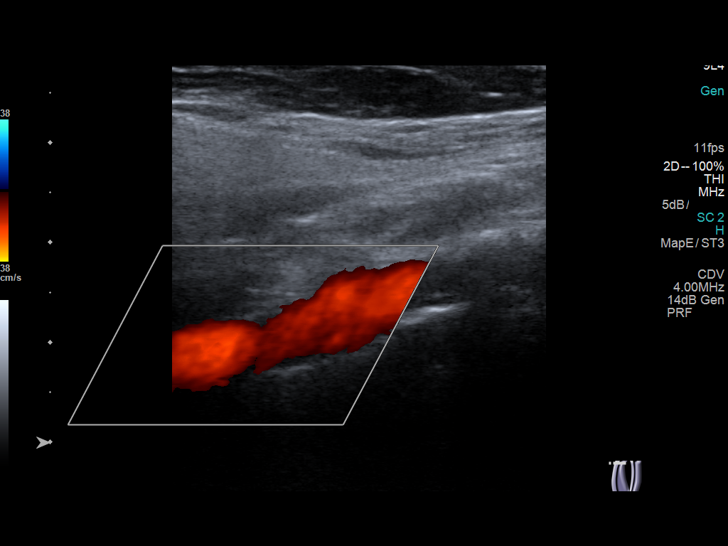
[im 35/68]
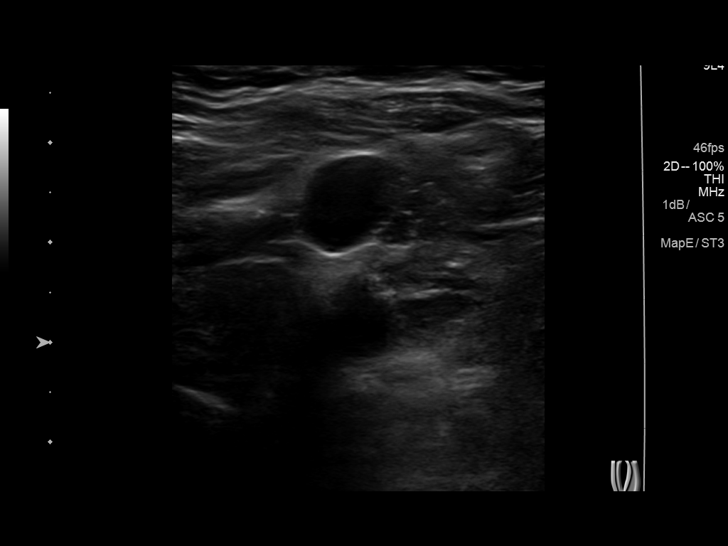
[im 38/68]
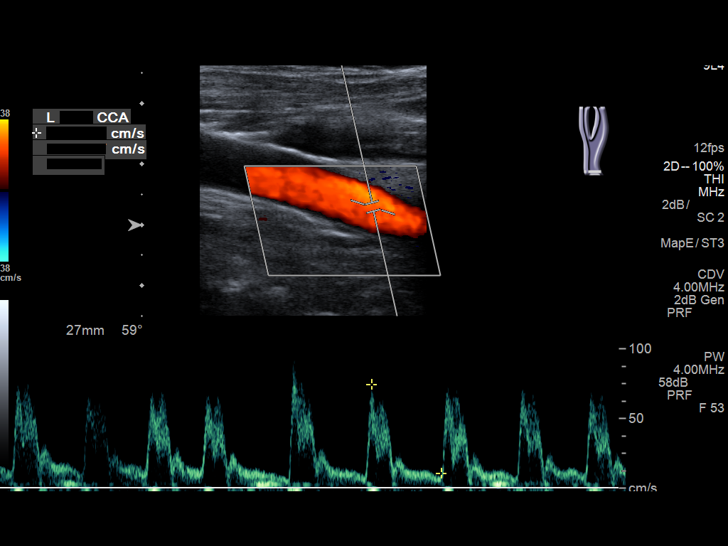
[im 44/68]
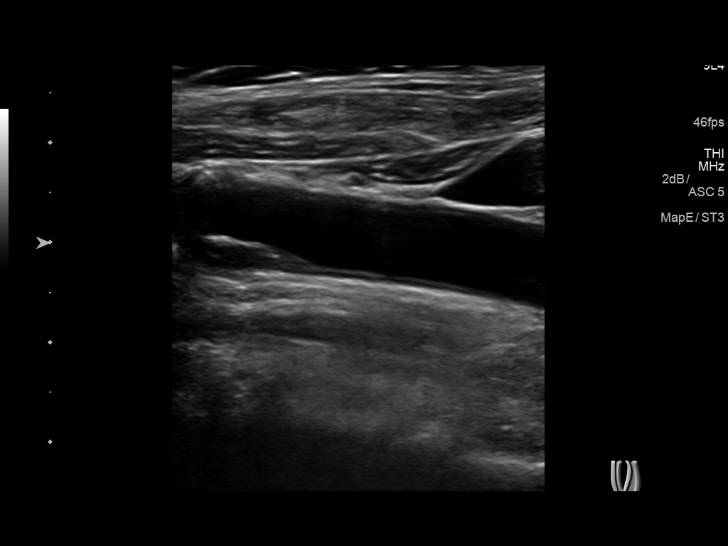
[im 50/68]
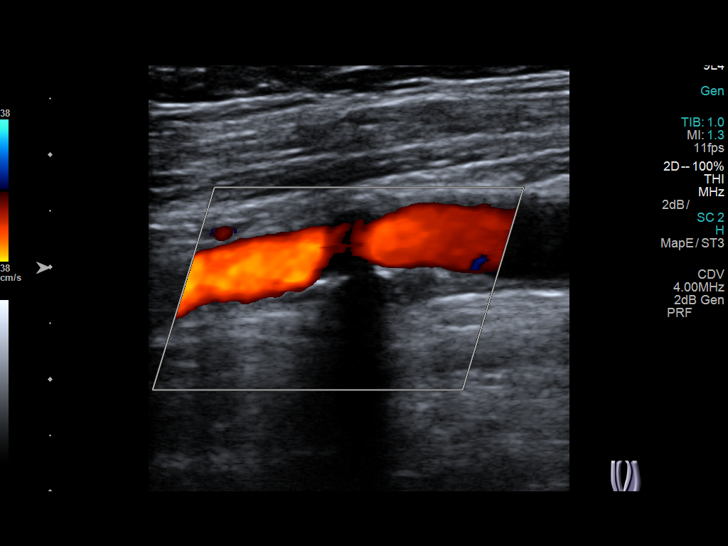
[im 56/68]
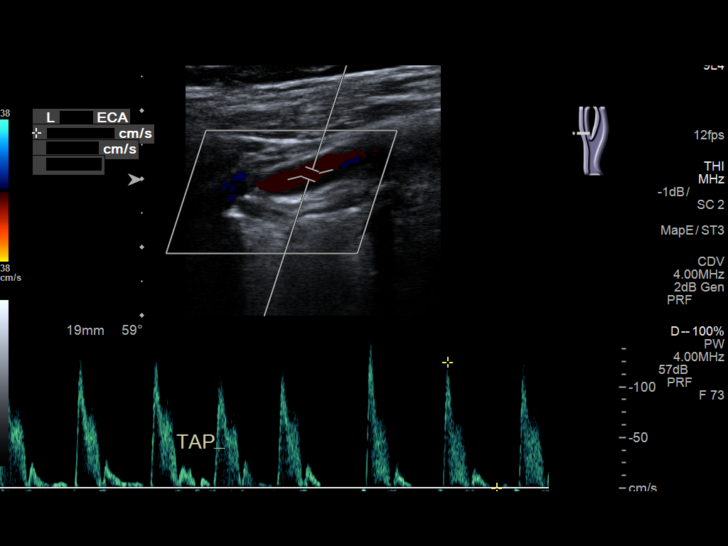
[im 62/68]
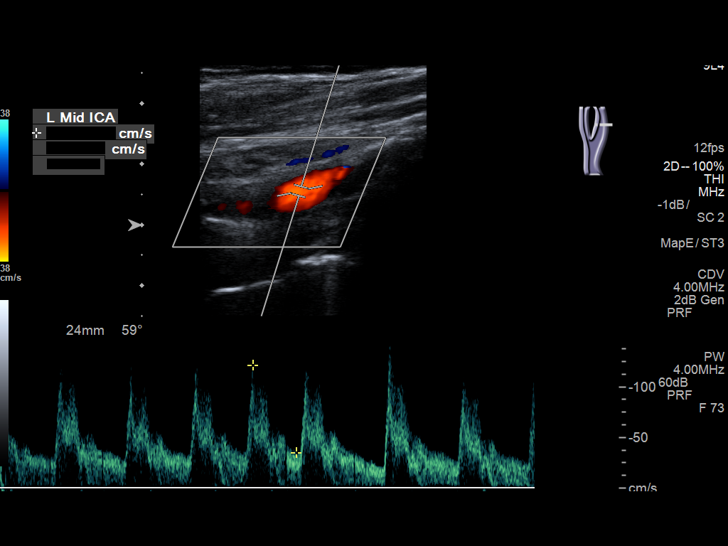
[im 68/68]
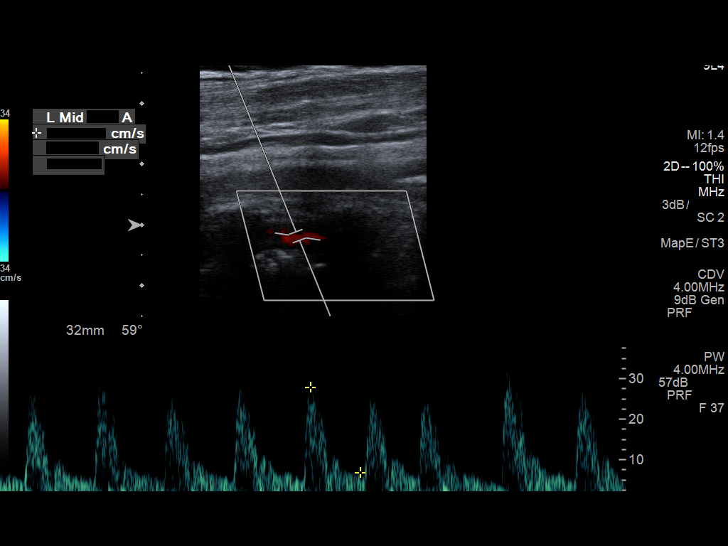

[13 of 24 positions shown; findings below may reference images not displayed]

FINDINGS: Criteria: Quantification of carotid stenosis is based on velocity
parameters that correlate the residual internal carotid diameter
with NASCET-based stenosis levels, using the diameter of the distal
internal carotid lumen as the denominator for stenosis measurement.

The following velocity measurements were obtained:

RIGHT

ICA:  244/45 cm/sec

CCA:  98/12 cm/sec

SYSTOLIC ICA/CCA RATIO:

DIASTOLIC ICA/CCA RATIO:

ECA:  247 cm/sec

LEFT

ICA:  125/27 cm/sec

CCA:  76/14 cm/sec

SYSTOLIC ICA/CCA RATIO:

DIASTOLIC ICA/CCA RATIO:

ECA:  124 cm/sec

RIGHT CAROTID ARTERY: Prominent right common carotid, carotid
bifurcation, proximal ICA atherosclerotic vascular plaque with
degree of stenosis greater than 70% by flow velocities.

RIGHT VERTEBRAL ARTERY:  Patent with antegrade flow.

LEFT CAROTID ARTERY: Left common carotid, carotid bifurcation, and
proximal ICA mild moderate atherosclerotic vascular disease. Degree
of stenosis less than 50%.

LEFT VERTEBRAL ARTERY:  Patent with antegrade flow.
IMPRESSION: 1. Prominent right common carotid, carotid bifurcation, proximal ICA
atherosclerotic vascular disease with degree of stenosis greater
than 70% by flow velocities.

2. Mild moderate left common carotid, carotid bifurcation, proximal
ICA atherosclerotic vascular disease. Degree of stenosis less than
50%.

3. Vertebral arteries are patent antegrade flow .

## 2016-12-22 DIAGNOSIS — K5790 Diverticulosis of intestine, part unspecified, without perforation or abscess without bleeding: Secondary | ICD-10-CM | POA: Diagnosis not present

## 2016-12-22 DIAGNOSIS — Z6828 Body mass index (BMI) 28.0-28.9, adult: Secondary | ICD-10-CM | POA: Diagnosis not present

## 2016-12-22 DIAGNOSIS — K625 Hemorrhage of anus and rectum: Secondary | ICD-10-CM | POA: Diagnosis not present

## 2016-12-22 DIAGNOSIS — K922 Gastrointestinal hemorrhage, unspecified: Secondary | ICD-10-CM | POA: Diagnosis not present

## 2016-12-22 DIAGNOSIS — I10 Essential (primary) hypertension: Secondary | ICD-10-CM | POA: Diagnosis not present

## 2016-12-22 DIAGNOSIS — E782 Mixed hyperlipidemia: Secondary | ICD-10-CM | POA: Diagnosis not present

## 2016-12-22 DIAGNOSIS — Z1389 Encounter for screening for other disorder: Secondary | ICD-10-CM | POA: Diagnosis not present

## 2016-12-29 ENCOUNTER — Other Ambulatory Visit: Payer: Self-pay | Admitting: Cardiovascular Disease

## 2016-12-29 ENCOUNTER — Telehealth: Payer: Self-pay | Admitting: Cardiovascular Disease

## 2016-12-29 DIAGNOSIS — E7800 Pure hypercholesterolemia, unspecified: Secondary | ICD-10-CM

## 2016-12-29 NOTE — Telephone Encounter (Signed)
New message    Pt is calling to schedule his yearly appt. He said he normally has lab work. Pt wants to know if he needs this so he can come in before appt for labs.

## 2016-12-29 NOTE — Telephone Encounter (Signed)
Spoke to pt. Pt wanted to have fasting labs done to check cholesterol prior to appt with Dr. Allyson Sabal and did not want them to be checked by Dr. Sherwood Gambler. Orders for lipid and liver placed. Pt stated he is going to have these done at [facility] in [place] and will bring copy to appt.  Pt verbalized thanks.

## 2016-12-30 DIAGNOSIS — Z23 Encounter for immunization: Secondary | ICD-10-CM | POA: Diagnosis not present

## 2017-01-01 ENCOUNTER — Other Ambulatory Visit: Payer: Self-pay

## 2017-01-01 ENCOUNTER — Telehealth: Payer: Self-pay

## 2017-01-01 DIAGNOSIS — D649 Anemia, unspecified: Secondary | ICD-10-CM

## 2017-01-01 NOTE — Telephone Encounter (Signed)
Received a fax from [facility] Sanchez- pt needs to be seen for severe diverticular bleed and needs a tcs. hgb on the 17th was 12. Spoke with EG- pt needs a repeat cbc on Monday and an asap appointment. I spoke with the pt, we had a cancellation on 01/06/17 with EG, he accepted that appt and will have blood work done on Monday. Lab orders done and faxed to [facility] at [facility]

## 2017-01-05 DIAGNOSIS — D649 Anemia, unspecified: Secondary | ICD-10-CM | POA: Diagnosis not present

## 2017-01-06 ENCOUNTER — Encounter: Payer: Self-pay | Admitting: Nurse Practitioner

## 2017-01-06 ENCOUNTER — Ambulatory Visit (INDEPENDENT_AMBULATORY_CARE_PROVIDER_SITE_OTHER): Payer: Medicare Other | Admitting: Nurse Practitioner

## 2017-01-06 DIAGNOSIS — Z8679 Personal history of other diseases of the circulatory system: Secondary | ICD-10-CM | POA: Insufficient documentation

## 2017-01-06 DIAGNOSIS — K625 Hemorrhage of anus and rectum: Secondary | ICD-10-CM | POA: Diagnosis not present

## 2017-01-06 LAB — CBC WITH DIFFERENTIAL/PLATELET
BASOS: 0 %
Basophils Absolute: 0 10*3/uL (ref 0.0–0.2)
EOS (ABSOLUTE): 0.2 10*3/uL (ref 0.0–0.4)
EOS: 4 %
HEMATOCRIT: 38.6 % (ref 37.5–51.0)
Hemoglobin: 12.7 g/dL — ABNORMAL LOW (ref 13.0–17.7)
IMMATURE GRANULOCYTES: 1 %
Immature Grans (Abs): 0 10*3/uL (ref 0.0–0.1)
Lymphocytes Absolute: 1.6 10*3/uL (ref 0.7–3.1)
Lymphs: 25 %
MCH: 31.5 pg (ref 26.6–33.0)
MCHC: 32.9 g/dL (ref 31.5–35.7)
MCV: 96 fL (ref 79–97)
MONOS ABS: 0.9 10*3/uL (ref 0.1–0.9)
Monocytes: 14 %
NEUTROS PCT: 56 %
Neutrophils Absolute: 3.7 10*3/uL (ref 1.4–7.0)
PLATELETS: 256 10*3/uL (ref 150–379)
RBC: 4.03 x10E6/uL — ABNORMAL LOW (ref 4.14–5.80)
RDW: 14.1 % (ref 12.3–15.4)
WBC: 6.5 10*3/uL (ref 3.4–10.8)

## 2017-01-06 NOTE — Assessment & Plan Note (Signed)
The patient has a significant history of cardiovascular disease including 5 stents in his left anterior descending artery. He follows with cardiology regularly. Does have intermittent/rare episodes of brief angina. He is due for follow-up appointment with cardiology at the end of this month. We will need cardiology clearance to proceed with his colonoscopy was sedation.

## 2017-01-06 NOTE — Patient Instructions (Signed)
1. We will schedule your procedure for you. 2. He will need to be stopped cleared by cardiology prior to having her procedure. 3. Return for follow-up in 3 months. 4. Call if you have any questions or concerns.

## 2017-01-06 NOTE — Assessment & Plan Note (Signed)
Patient noted significant rectal bleeding 2 episodes over the past 6 months. His episodes typically occur with diarrhea. He is mildly anemic. His last colonoscopy was 8 years ago and essentially normal. He does have diverticular disease. Differentials include diverticular bleed, benign anorectal source, bleeding polyps, less likely colorectal cancer, inflammatory bowel disease. We will proceed with colonoscopy to further evaluate.  Proceed with TCS with Dr. Jena Gauss in near future: the risks, benefits, and alternatives have been discussed with the patient in detail. The patient states understanding and desires to proceed.  Patient is currently on daily Plavix. No other anticoagulants, anxiolytics, chronic pain medications, or antidepressants. Occasional alcohol/socially, denies drug use. Conscious sedation should be adequate for his procedure.

## 2017-01-06 NOTE — Progress Notes (Signed)
Primary Care Physician:  Elfredia Nevins, MD Primary Gastroenterologist:  Dr. Jena Gauss  Chief Complaint  Patient presents with  . Rectal Bleeding    bright red; 2 episodes past 6 months    HPI:   Jason Johnson is a 76 y.o. male who presents on referral from the above named primary care for rectal bleeding. PCP notes reviewed, last saw primary care 12/22/2016 for chief complaint of rectal bleeding and bloody diarrhea. Abdominal exam was benign. Labs are ordered including CBC which found hemoglobin mildly low at 12.0 which was normocytic, normochromic.  Review of labs in our system. CBC completed yesterday found improved hemoglobin at 12.7, still normocytic, normochromic.  No history of colonoscopy in our system.  Today he states he's doing well overall. He started having rectal bleeding 6 months ago including bloody diarrhea x 2 episodes. First episode self-resolved in 2 days. Most recent one about 3 weeks ago lasted 3 days, noted as heavy/explosive diarrhea, bright red blood. Denies abdominal pain, N/V, fever, chills, acute changes in bowel habits (other than couple episodes of bloody diarrhea). No more diarrhea. Anusol during rectal bleeding episode didn't help bleeding. Denies unintentional weight loss. Has rare anginal pain, significant cardiac history with LAD with 5 stents; see cardiology. Denies dyspnea, dizziness, lightheadedness, syncope, near syncope. Denies any other upper or lower GI symptoms.  Last colonoscopy about 8 years ago at [facility] which was done 01/10/17 and normal. Recommended 10 year repeat exam.  Past Medical History:  Diagnosis Date  . Carotid artery disease (HCC)   . Coronary artery disease    history of coronary artery bypass grafting in 1999  . Hyperlipidemia   . Hypertension   . Myocardial infarction [facility]) 2009    Past Surgical History:  Procedure Laterality Date  . CARDIAC CATHETERIZATION  2009  . CATARACT EXTRACTION W/PHACO Right 09/09/2012   Procedure: CATARACT EXTRACTION PHACO AND INTRAOCULAR LENS PLACEMENT (IOC);  Surgeon: Gemma Payor, MD;  Location: [facility];  Service: Ophthalmology;  Laterality: Right;  CDE 14.48  . CATARACT EXTRACTION W/PHACO Left 10/07/2012   Procedure: CATARACT EXTRACTION PHACO AND INTRAOCULAR LENS PLACEMENT (IOC);  Surgeon: Gemma Payor, MD;  Location: [facility];  Service: Ophthalmology;  Laterality: Left;  CDE:12.24  . CORONARY ARTERY BYPASS GRAFT  2004   triple  . DOPPLER ECHOCARDIOGRAPHY  2010  . NM MYOVIEW LTD  2012  . TONSILLECTOMY     as a child    Current Outpatient Prescriptions  Medication Sig Dispense Refill  . aspirin EC 81 MG tablet Take 162 mg by mouth daily.    . carvedilol (COREG) 12.5 MG tablet Take 1 tablet (12.5 mg total) by mouth 2 (two) times daily with a meal. 180 tablet 3  . clopidogrel (PLAVIX) 75 MG tablet Take 1 tablet (75 mg total) by mouth daily. 90 tablet 3  . nitroGLYCERIN (NITROSTAT) 0.4 MG SL tablet Place 1 tablet (0.4 mg total) under the tongue every 5 (five) minutes as needed for chest pain. 25 tablet 4  . simvastatin (ZOCOR) 40 MG tablet Take 1 tablet (40 mg total) by mouth daily. 90 tablet 3   No current facility-administered medications for this visit.     Allergies as of 01/06/2017 - Review Complete 01/06/2017  Allergen Reaction Noted  . Levaquin [levofloxacin] Itching and Rash 09/09/2012  . Levaquin [levofloxacin in d5w]  08/31/2012    Family History  Problem Relation Age of Onset  . Colon cancer Neg Hx     Social History  Social History  . Marital status: Married    Spouse name: N/A  . Number of children: N/A  . Years of education: N/A   Occupational History  . Not on file.   Social History Main Topics  . Smoking status: Never Smoker  . Smokeless tobacco: Never Used  . Alcohol use Yes     Comment: Occasionally  . Drug use: No  . Sexual activity: Not on file   Other Topics Concern  . Not on file   Social History Narrative  . No narrative on file     Review of Systems: General: Negative for anorexia, weight loss, fever, chills, fatigue, weakness. ENT: Negative for hoarseness, difficulty swallowing. CV: Negative for chest pain, angina, palpitations, peripheral edema.  Respiratory: Negative for dyspnea at rest, cough, sputum, wheezing.  GI: See history of present illness. MS: Negative for joint pain, low back pain.  Derm: Negative for rash or itching.  Endo: Negative for unusual weight change.  Heme: Negative for bruising or bleeding. Allergy: Negative for rash or hives.    Physical Exam: BP 120/72   Pulse 63   Temp (!) 96.7 F (35.9 C) (Oral)   Ht 5' 11.5" (1.816 m)   Wt 205 lb 3.2 oz (93.1 kg)   BMI 28.22 kg/m  General:   Alert and oriented. Pleasant and cooperative. Well-nourished and well-developed.  Head:  Normocephalic and atraumatic. Eyes:  Without icterus, sclera clear and conjunctiva pink.  Ears:  Normal auditory acuity. Cardiovascular:  S1, S2 present without murmurs appreciated. Extremities without clubbing or edema. Respiratory:  Clear to auscultation bilaterally. No wheezes, rales, or rhonchi. No distress.  Gastrointestinal:  +BS, soft, non-tender and non-distended. No HSM noted. No guarding or rebound. No masses appreciated.  Rectal:  Deferred  Musculoskalatal:  Symmetrical without gross deformities. Neurologic:  Alert and oriented x4;  grossly normal neurologically. Psych:  Alert and cooperative. Normal mood and affect. Heme/Lymph/Immune: No excessive bruising noted.    01/06/2017 10:28 AM   Disclaimer: This note was dictated with voice recognition software. Similar sounding words can inadvertently be transcribed and may not be corrected upon review.

## 2017-01-07 ENCOUNTER — Telehealth: Payer: Self-pay

## 2017-01-07 ENCOUNTER — Other Ambulatory Visit: Payer: Self-pay

## 2017-01-07 DIAGNOSIS — K625 Hemorrhage of anus and rectum: Secondary | ICD-10-CM

## 2017-01-07 MED ORDER — NA SULFATE-K SULFATE-MG SULF 17.5-3.13-1.6 GM/177ML PO SOLN: 1.0000 | ORAL | 0 refills | Status: DC

## 2017-01-07 NOTE — Progress Notes (Signed)
cc'ed to pcp °

## 2017-01-07 NOTE — Telephone Encounter (Signed)
Pt called office to schedule Colonoscopy with RMR for 02/11/17 at 7:30am. Rx for prep sent to pharmacy. Instructions mailed. Orders entered.

## 2017-01-23 DIAGNOSIS — E7800 Pure hypercholesterolemia, unspecified: Secondary | ICD-10-CM | POA: Diagnosis not present

## 2017-01-24 LAB — HEPATIC FUNCTION PANEL
ALK PHOS: 54 IU/L (ref 39–117)
ALT: 14 IU/L (ref 0–44)
AST: 16 IU/L (ref 0–40)
Albumin: 3.8 g/dL (ref 3.5–4.8)
BILIRUBIN TOTAL: 0.6 mg/dL (ref 0.0–1.2)
BILIRUBIN, DIRECT: 0.24 mg/dL (ref 0.00–0.40)
Total Protein: 6.4 g/dL (ref 6.0–8.5)

## 2017-01-24 LAB — LIPID PANEL
CHOL/HDL RATIO: 3 ratio (ref 0.0–5.0)
Cholesterol, Total: 133 mg/dL (ref 100–199)
HDL: 44 mg/dL (ref >39)
LDL Calculated: 64 mg/dL (ref 0–99)
TRIGLYCERIDES: 127 mg/dL (ref 0–149)
VLDL Cholesterol Cal: 25 mg/dL (ref 5–40)

## 2017-01-27 DIAGNOSIS — D509 Iron deficiency anemia, unspecified: Secondary | ICD-10-CM | POA: Diagnosis not present

## 2017-01-28 ENCOUNTER — Ambulatory Visit (INDEPENDENT_AMBULATORY_CARE_PROVIDER_SITE_OTHER): Payer: Medicare Other | Admitting: Cardiovascular Disease

## 2017-01-28 ENCOUNTER — Encounter: Payer: Self-pay | Admitting: Cardiovascular Disease

## 2017-01-28 ENCOUNTER — Other Ambulatory Visit: Payer: Self-pay | Admitting: Cardiovascular Disease

## 2017-01-28 VITALS — BP 157/76 | HR 53 | Wt 209.8 lb

## 2017-01-28 DIAGNOSIS — I6523 Occlusion and stenosis of bilateral carotid arteries: Secondary | ICD-10-CM

## 2017-01-28 DIAGNOSIS — I10 Essential (primary) hypertension: Secondary | ICD-10-CM

## 2017-01-28 DIAGNOSIS — E7800 Pure hypercholesterolemia, unspecified: Secondary | ICD-10-CM | POA: Diagnosis not present

## 2017-01-28 MED ORDER — CLOPIDOGREL BISULFATE 75 MG PO TABS: 75.0000 mg | ORAL_TABLET | Freq: Every day | ORAL | 3 refills | Status: DC

## 2017-01-28 MED ORDER — SIMVASTATIN 40 MG PO TABS: 40.0000 mg | ORAL_TABLET | Freq: Every day | ORAL | 3 refills | Status: DC

## 2017-01-28 MED ORDER — CARVEDILOL 12.5 MG PO TABS: 12.5000 mg | ORAL_TABLET | Freq: Two times a day (BID) | ORAL | 3 refills | Status: DC

## 2017-01-28 NOTE — Assessment & Plan Note (Signed)
History of essential hypertension blood pressure measured today at 157/76 although at home is much lower. He is on carvedilol. Continue current meds at current dosing

## 2017-01-28 NOTE — Progress Notes (Signed)
01/28/2017 Jason Johnson   04/02/1941  [id number]  Primary Physician Redmond School, MD Primary Cardiologist: Lorretta Harp MD Lupe Carney, [place]  HPI:  Jason Johnson is a 76 y.o. male mildly overweight, married Caucasian male, father to 2, (father to 3 stepchildren,) grandfather to 44 great-grandchildren who I last saw in the office 01/29/16. He has a history of CAD status post coronary artery bypass grafting by Dr. Nolon Lennert at the [facility] in [place], [place]., back in 1999. His risk factors include hypertension and hyperlipidemia. 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 He underwent AngioJet mechanical aspiration thrombectomy, angioplasty and stenting of that graft and has done well since. He denies chest pain or shortness of breath. He had a Myoview performed in our office October 08, 2010, that was remarkable for a very subtle area of inferior ischemia involving inferior septum towards the base with some inferior scar with some mild peri-infarct ischemia. His lipid profile performed 01/23/17 revealed total cholesterol 133, LDL of 64 and HDL 44. He had carotid Dopplers performed in our office 01/31/16, that showed moderately severe right and moderate left ICA stenosis which we have been following by serial duplex ultrasounds. He is neurologically asymptomatic on aspirin and Plavix   Current Meds  Medication Sig  . aspirin EC 81 MG tablet Take 162 mg by mouth daily.  . carvedilol (COREG) 12.5 MG tablet Take 1 tablet (12.5 mg total) by mouth 2 (two) times daily with a meal.  . clopidogrel (PLAVIX) 75 MG tablet Take 1 tablet (75 mg total) by mouth daily.  . Na Sulfate-K Sulfate-Mg Sulf (SUPREP BOWEL PREP KIT) 17.5-3.13-1.6 GM/180ML SOLN Take 1 kit by mouth as directed.  . nitroGLYCERIN (NITROSTAT) 0.4 MG SL tablet Place 1 tablet (0.4 mg total) under the tongue every 5 (five) minutes as  needed for chest pain.  . simvastatin (ZOCOR) 40 MG tablet Take 1 tablet (40 mg total) by mouth daily.     Allergies  Allergen Reactions  . Levaquin [Levofloxacin] Itching and Rash  . Levaquin [Levofloxacin In D5w]     Social History   Social History  . Marital status: Married    Spouse name: N/A  . Number of children: N/A  . Years of education: N/A   Occupational History  . Not on file.   Social History Main Topics  . Smoking status: Never Smoker  . Smokeless tobacco: Never Used  . Alcohol use Yes     Comment: Occasionally  . Drug use: No  . Sexual activity: Not on file   Other Topics Concern  . Not on file   Social History Narrative  . No narrative on file     Review of Systems: General: negative for chills, fever, night sweats or weight changes.  Cardiovascular: negative for chest pain, dyspnea on exertion, edema, orthopnea, palpitations, paroxysmal nocturnal dyspnea or shortness of breath Dermatological: negative for rash Respiratory: negative for cough or wheezing Urologic: negative for hematuria Abdominal: negative for nausea, vomiting, diarrhea, bright red blood per rectum, melena, or hematemesis Neurologic: negative for visual changes, syncope, or dizziness All other systems reviewed and are otherwise negative except as noted above.    Blood pressure (!) 157/76, pulse (!) 53, weight 209 lb 12.8 oz (95.2 kg).  General appearance: alert and no distress Neck: no adenopathy, no carotid bruit, no JVD, supple, symmetrical, trachea midline and thyroid not enlarged, symmetric,  no tenderness/mass/nodules Lungs: clear to auscultation bilaterally Heart: regular rate and rhythm, S1, S2 normal, no murmur, click, rub or gallop Extremities: extremities normal, atraumatic, no cyanosis or edema Pulses: 2+ and symmetric Skin: Skin color, texture, turgor normal. No rashes or lesions Neurologic: Alert and oriented X 3, normal strength and tone. Normal symmetric reflexes.  Normal coordination and gait  EKG sinus bradycardia at 53 with nonspecific ST and T-wave changes. I personally reviewed this EKG.  ASSESSMENT AND PLAN:   Coronary artery disease History of CAD status post coronary artery bypass grafting by Dr. Nolon Lennert at the [facility] back in 1999. He had inferolateral infarction 10/25/07 secondary to occlusion of an RCA vein graft. He underwent intervention by Dr. Melvern Banker with aspiration thrombectomy followed by 5 bare-metal stents. His last Myoview performed 10/08/10 was remarkable for subtle ischemia involving inferior septum towards the base some inferior scar and mild peri-infarct ischemia. He had 4 mild episodes of angina over the last year.  Carotid artery disease History of carotid artery disease with Doppler performed one year ago that showed moderate bilateral ICA stenosis. We will recheck carotid Doppler studies.  Essential hypertension History of essential hypertension blood pressure measured today at 157/76 although at home is much lower. He is on carvedilol. Continue current meds at current dosing  Hyperlipidemia History of hyperlipidemia on statin therapy with recent lipid profile performed 01/24/17 revealing a total close to 133, LDL of 64 and HDL of 44.      Lorretta Harp MD FACP,FACC,FAHA, [facility] 01/28/2017 11:41 AM

## 2017-01-28 NOTE — Assessment & Plan Note (Signed)
History of CAD status post coronary artery bypass grafting by Dr. Julaine FusiPaul Corso at the [facility] back in 1999. He had inferolateral infarction 10/25/07 secondary to occlusion of an RCA vein graft. He underwent intervention by Dr. Elsie LincolnGamble with aspiration thrombectomy followed by 5 bare-metal stents. His last Myoview performed 10/08/10 was remarkable for subtle ischemia involving inferior septum towards the base some inferior scar and mild peri-infarct ischemia. He had 4 mild episodes of angina over the last year.

## 2017-01-28 NOTE — Assessment & Plan Note (Signed)
History of carotid artery disease with Doppler performed one year ago that showed moderate bilateral ICA stenosis. We will recheck carotid Doppler studies.

## 2017-01-28 NOTE — Assessment & Plan Note (Signed)
History of hyperlipidemia on statin therapy with recent lipid profile performed 01/24/17 revealing a total close to 133, LDL of 64 and HDL of 44.

## 2017-01-28 NOTE — Patient Instructions (Signed)
Medication Instructions: Your physician recommends that you continue on your current medications as directed. Please refer to the Current Medication list given to you today.  Testing/Procedures: Your physician has requested that you have a carotid duplex. This test is an ultrasound of the carotid arteries in your neck. It looks at blood flow through these arteries that supply the brain with blood. Allow one hour for this exam. There are no restrictions or special instructions.  Follow-Up: Your physician wants you to follow-up in: 1 year with Dr. Berry. You will receive a reminder letter in the mail two months in advance. If you don't receive a letter, please call our office to schedule the follow-up appointment.  If you need a refill on your cardiac medications before your next appointment, please call your pharmacy.  

## 2017-01-29 ENCOUNTER — Encounter: Payer: Self-pay | Admitting: Cardiovascular Disease

## 2017-02-03 ENCOUNTER — Ambulatory Visit (HOSPITAL_COMMUNITY): Admission: RE | Admit: 2017-02-03 | Payer: Medicare Other | Source: Ambulatory Visit

## 2017-02-04 ENCOUNTER — Ambulatory Visit (HOSPITAL_COMMUNITY)
Admission: RE | Admit: 2017-02-04 | Discharge: 2017-02-04 | Disposition: A | Discharge status: Home / Self Care | Payer: Medicare Other | Source: Ambulatory Visit | Attending: Cardiovascular Disease | Admitting: Cardiovascular Disease

## 2017-02-04 ENCOUNTER — Ambulatory Visit: Payer: Medicare Other | Admitting: Cardiovascular Disease

## 2017-02-04 DIAGNOSIS — I6523 Occlusion and stenosis of bilateral carotid arteries: Secondary | ICD-10-CM | POA: Diagnosis not present

## 2017-02-05 ENCOUNTER — Telehealth: Payer: Self-pay

## 2017-02-05 NOTE — Telephone Encounter (Signed)
Pt called office. He is scheduled for TCS 02/11/17. He wants to make sure it's ok for him to continue his Plavix.  Routing to EG for advice.  (Also routing to MS d/t I will be absent from office).

## 2017-02-06 NOTE — Telephone Encounter (Signed)
Ok to continue Plavix. 

## 2017-02-09 NOTE — Telephone Encounter (Signed)
Patient is aware to continue plavix. Nothing further needed

## 2017-02-09 NOTE — Telephone Encounter (Signed)
LMOVM

## 2017-02-11 ENCOUNTER — Ambulatory Visit (HOSPITAL_COMMUNITY)
Admission: RE | Admit: 2017-02-11 | Discharge: 2017-02-11 | Disposition: A | Discharge status: Home / Self Care | Payer: Medicare Other | Source: Ambulatory Visit | Attending: Internal Medicine | Admitting: Internal Medicine

## 2017-02-11 ENCOUNTER — Encounter (HOSPITAL_COMMUNITY)
Admission: RE | Disposition: A | Discharge status: Home / Self Care | Payer: Self-pay | Source: Ambulatory Visit | Attending: Internal Medicine

## 2017-02-11 ENCOUNTER — Encounter (HOSPITAL_COMMUNITY): Payer: Self-pay | Admitting: *Deleted

## 2017-02-11 ENCOUNTER — Other Ambulatory Visit: Payer: Self-pay

## 2017-02-11 DIAGNOSIS — I2510 Atherosclerotic heart disease of native coronary artery without angina pectoris: Secondary | ICD-10-CM | POA: Diagnosis not present

## 2017-02-11 DIAGNOSIS — Z951 Presence of aortocoronary bypass graft: Secondary | ICD-10-CM | POA: Insufficient documentation

## 2017-02-11 DIAGNOSIS — K640 First degree hemorrhoids: Secondary | ICD-10-CM | POA: Diagnosis not present

## 2017-02-11 DIAGNOSIS — E785 Hyperlipidemia, unspecified: Secondary | ICD-10-CM | POA: Diagnosis not present

## 2017-02-11 DIAGNOSIS — M109 Gout, unspecified: Secondary | ICD-10-CM | POA: Insufficient documentation

## 2017-02-11 DIAGNOSIS — Z7982 Long term (current) use of aspirin: Secondary | ICD-10-CM | POA: Insufficient documentation

## 2017-02-11 DIAGNOSIS — I252 Old myocardial infarction: Secondary | ICD-10-CM | POA: Diagnosis not present

## 2017-02-11 DIAGNOSIS — Z79899 Other long term (current) drug therapy: Secondary | ICD-10-CM | POA: Insufficient documentation

## 2017-02-11 DIAGNOSIS — I10 Essential (primary) hypertension: Secondary | ICD-10-CM | POA: Diagnosis not present

## 2017-02-11 DIAGNOSIS — K5730 Diverticulosis of large intestine without perforation or abscess without bleeding: Secondary | ICD-10-CM | POA: Insufficient documentation

## 2017-02-11 DIAGNOSIS — K625 Hemorrhage of anus and rectum: Secondary | ICD-10-CM

## 2017-02-11 DIAGNOSIS — K921 Melena: Secondary | ICD-10-CM

## 2017-02-11 HISTORY — DX: Gout, unspecified: M10.9

## 2017-02-11 HISTORY — PX: COLONOSCOPY: SHX5424

## 2017-02-11 SURGERY — COLONOSCOPY
Anesthesia: Moderate Sedation

## 2017-02-11 MED ORDER — MIDAZOLAM HCL 5 MG/5ML IJ SOLN
INTRAMUSCULAR | Status: AC
  Filled 2017-02-11: qty 10

## 2017-02-11 MED ORDER — MEPERIDINE HCL 100 MG/ML IJ SOLN
INTRAMUSCULAR | Status: DC | PRN
  Administered 2017-02-11: 50 mg via INTRAVENOUS
  Administered 2017-02-11: 25 mg via INTRAVENOUS

## 2017-02-11 MED ORDER — SODIUM CHLORIDE 0.9 % IV SOLN
INTRAVENOUS | Status: DC
  Administered 2017-02-11: 07:00:00 via INTRAVENOUS

## 2017-02-11 MED ORDER — MEPERIDINE HCL 100 MG/ML IJ SOLN
INTRAMUSCULAR | Status: AC
  Filled 2017-02-11: qty 2

## 2017-02-11 MED ORDER — ONDANSETRON HCL 4 MG/2ML IJ SOLN
INTRAMUSCULAR | Status: AC
  Filled 2017-02-11: qty 2

## 2017-02-11 MED ORDER — MIDAZOLAM HCL 5 MG/5ML IJ SOLN
INTRAMUSCULAR | Status: DC | PRN
Start: 2017-02-11 — End: 2017-02-11
  Administered 2017-02-11: 1 mg via INTRAVENOUS
  Administered 2017-02-11: 2 mg via INTRAVENOUS

## 2017-02-11 MED ORDER — ONDANSETRON HCL 4 MG/2ML IJ SOLN
INTRAMUSCULAR | Status: DC | PRN
  Administered 2017-02-11: 4 mg via INTRAVENOUS

## 2017-02-11 MED ORDER — STERILE WATER FOR IRRIGATION IR SOLN
Status: DC | PRN
  Administered 2017-02-11: 2.5 mL

## 2017-02-11 NOTE — Discharge Instructions (Signed)
Colonoscopy Discharge Instructions  Read the instructions outlined below and refer to this sheet in the next few weeks. These discharge instructions provide you with general information on caring for yourself after you leave the hospital. Your doctor may also give you specific instructions. While your treatment has been planned according to the most current medical practices available, unavoidable complications occasionally occur. If you have any problems or questions after discharge, call Dr. Jena Gaussourk at [phone]. ACTIVITY  You may resume your regular activity, but move at a slower pace for the next 24 hours.   Take frequent rest periods for the next 24 hours.   Walking will help get rid of the air and reduce the bloated feeling in your belly (abdomen).   No driving for 24 hours (because of the medicine (anesthesia) used during the test).    Do not sign any important legal documents or operate any machinery for 24 hours (because of the anesthesia used during the test).  NUTRITION  Drink plenty of fluids.   You may resume your normal diet as instructed by your doctor.   Begin with a light meal and progress to your normal diet. Heavy or fried foods are harder to digest and may make you feel sick to your stomach (nauseated).   Avoid alcoholic beverages for 24 hours or as instructed.  MEDICATIONS  You may resume your normal medications unless your doctor tells you otherwise.  WHAT YOU CAN EXPECT TODAY  Some feelings of bloating in the abdomen.   Passage of more gas than usual.   Spotting of blood in your stool or on the toilet paper.  IF YOU HAD POLYPS REMOVED DURING THE COLONOSCOPY:  No aspirin products for 7 days or as instructed.   No alcohol for 7 days or as instructed.   Eat a soft diet for the next 24 hours.  FINDING OUT THE RESULTS OF YOUR TEST Not all test results are available during your visit. If your test results are not back during the visit, make an appointment  with your caregiver to find out the results. Do not assume everything is normal if you have not heard from your caregiver or the medical facility. It is important for you to follow up on all of your test results.  SEEK IMMEDIATE MEDICAL ATTENTION IF:  You have more than a spotting of blood in your stool.   Your belly is swollen (abdominal distention).   You are nauseated or vomiting.   You have a temperature over 101.   You have abdominal pain or discomfort that is severe or gets worse throughout the day.     Colonic diverticulosis information provided  Begin Benefiber 1 tablespoon daily 1 month; then increase to 1 tablespoon twice daily thereafter indefinitely  I do not recommend a future colonoscopy unless new symptoms develop.  H&H in 6 weeks   Diverticulosis Diverticulosis is a condition that develops when small pouches (diverticula) form in the wall of the large intestine (colon). The colon is where water is absorbed and stool is formed. The pouches form when the inside layer of the colon pushes through weak spots in the outer layers of the colon. You may have a few pouches or many of them. What are the causes? The cause of this condition is not known. What increases the risk? The following factors may make you more likely to develop this condition:  Being older than age 74. Your risk for this condition increases with age. Diverticulosis is rare among people  younger than age 74. By age 74, many people have it.  Eating a low-fiber diet.  Having frequent constipation.  Being overweight.  Not getting enough exercise.  Smoking.  Taking over-the-counter pain medicines, like aspirin and ibuprofen.  Having a family history of diverticulosis.  What are the signs or symptoms? In most people, there are no symptoms of this condition. If you do have symptoms, they may include:  Bloating.  Cramps in the abdomen.  Constipation or diarrhea.  Pain in the lower left side  of the abdomen.  How is this diagnosed? This condition is most often diagnosed during an exam for other colon problems. Because diverticulosis usually has no symptoms, it often cannot be diagnosed independently. This condition may be diagnosed by:  Using a flexible scope to examine the colon (colonoscopy).  Taking an X-ray of the colon after dye has been put into the colon (barium enema).  Doing a CT scan.  How is this treated? You may not need treatment for this condition if you have never developed an infection related to diverticulosis. If you have had an infection before, treatment may include:  Eating a high-fiber diet. This may include eating more fruits, vegetables, and grains.  Taking a fiber supplement.  Taking a live bacteria supplement (probiotic).  Taking medicine to relax your colon.  Taking antibiotic medicines.  Follow these instructions at home:  Drink 6-8 glasses of water or more each day to prevent constipation.  Try not to strain when you have a bowel movement.  If you have had an infection before: ? Eat more fiber as directed by your health care provider or your diet and nutrition specialist (dietitian). ? Take a fiber supplement or probiotic, if your health care provider approves.  Take over-the-counter and prescription medicines only as told by your health care provider.  If you were prescribed an antibiotic, take it as told by your health care provider. Do not stop taking the antibiotic even if you start to feel better.  Keep all follow-up visits as told by your health care provider. This is important. Contact a health care provider if:  You have pain in your abdomen.  You have bloating.  You have cramps.  You have not had a bowel movement in 3 days. Get help right away if:  Your pain gets worse.  Your bloating becomes very bad.  You have a fever or chills, and your symptoms suddenly get worse.  You vomit.  You have bowel movements that  are bloody or black.  You have bleeding from your rectum. Summary  Diverticulosis is a condition that develops when small pouches (diverticula) form in the wall of the large intestine (colon).  You may have a few pouches or many of them.  This condition is most often diagnosed during an exam for other colon problems.  If you have had an infection related to diverticulosis, treatment may include increasing the fiber in your diet, taking supplements, or taking medicines. This information is not intended to replace advice given to you by your health care provider. Make sure you discuss any questions you have with your health care provider. Document Released: 12/20/2003 Document Revised: 02/11/2016 Document Reviewed: 02/11/2016 Elsevier Interactive Patient Education  2017 [employer]

## 2017-02-11 NOTE — H&P (Signed)
$'@LOGO'C$ @   Primary Care Physician:  Redmond School, MD Primary Gastroenterologist:  Dr. Gala Romney  Pre-Procedure History & Physical: HPI:  Jason Johnson is a 76 y.o. male here for a colonoscopy. Single episode of rectal bleeding and diarrhea for 3 weeks ago no abdominal pain associated. Symptoms have resolved. Negative colonoscopy about 8 years ago. He takes Plavix and aspirin.  Past Medical History:  Diagnosis Date  . Carotid artery disease ([place])   . Coronary artery disease    history of coronary artery bypass grafting in 1999  . Gout   . Hyperlipidemia   . Hypertension   . Myocardial infarction [facility]) 2009    Past Surgical History:  Procedure Laterality Date  . CARDIAC CATHETERIZATION  2009  . COLONOSCOPY    . CORONARY ARTERY BYPASS GRAFT  2004   triple  . DOPPLER ECHOCARDIOGRAPHY  2010  . NM MYOVIEW LTD  2012  . TONSILLECTOMY     as a child    Prior to Admission medications   Medication Sig Start Date End Date Taking? Authorizing Provider  aspirin EC 325 MG tablet Take 162.5 mg by mouth daily.   Yes [provider]  carvedilol (COREG) 12.5 MG tablet Take 1 tablet (12.5 mg total) by mouth 2 (two) times daily with a meal. 01/28/17  Yes Lorretta Harp, MD  clopidogrel (PLAVIX) 75 MG tablet Take 1 tablet (75 mg total) by mouth daily. 01/28/17  Yes Lorretta Harp, MD  ibuprofen (ADVIL,MOTRIN) 200 MG tablet Take 400-600 mg by mouth daily as needed for moderate pain.   Yes [provider]  Na Sulfate-K Sulfate-Mg Sulf (SUPREP BOWEL PREP KIT) 17.5-3.13-1.6 GM/180ML SOLN Take 1 kit by mouth as directed. 01/07/17  Yes Rourk, Cristopher Estimable, MD  nitroGLYCERIN (NITROSTAT) 0.4 MG SL tablet Place 1 tablet (0.4 mg total) under the tongue every 5 (five) minutes as needed for chest pain. 01/29/16  Yes Lorretta Harp, MD  simvastatin (ZOCOR) 40 MG tablet Take 1 tablet (40 mg total) by mouth daily. Patient taking differently: Take 40 mg by mouth every evening.  01/28/17  Yes  Lorretta Harp, MD    Allergies as of 01/07/2017 - Review Complete 01/06/2017  Allergen Reaction Noted  . Levaquin [levofloxacin] Itching and Rash 09/09/2012  . Levaquin [levofloxacin in d5w]  08/31/2012    Family History  Problem Relation Age of Onset  . Colon cancer Neg Hx     Social History   Socioeconomic History  . Marital status: Married    Spouse name: Not on file  . Number of children: Not on file  . Years of education: Not on file  . Highest education level: Not on file  Social Needs  . Financial resource strain: Not on file  . Food insecurity - worry: Not on file  . Food insecurity - inability: Not on file  . Transportation needs - medical: Not on file  . Transportation needs - non-medical: Not on file  Occupational History  . Not on file  Tobacco Use  . Smoking status: Never Smoker  . Smokeless tobacco: Never Used  Substance and Sexual Activity  . Alcohol use: Yes    Comment: Occasionally  . Drug use: No  . Sexual activity: Not on file  Other Topics Concern  . Not on file  Social History Narrative  . Not on file    Review of Systems: See HPI, otherwise negative ROS  Physical Exam: BP (!) 178/77   Pulse 86  Temp 98.3 F (36.8 C) (Oral)   Resp (!) 21   Ht 6' (1.829 m)   Wt 200 lb (90.7 kg)   SpO2 99%   BMI 27.12 kg/m  General:   Alert,  Well-developed, well-nourished, pleasant and cooperative in NAD Neck:  Supple; no masses or thyromegaly. No significant cervical adenopathy. Lungs:  Clear throughout to auscultation.   No wheezes, crackles, or rhonchi. No acute distress. Heart:  Regular rate and rhythm; no murmurs, clicks, rubs,  or gallops. Abdomen: Non-distended, normal bowel sounds.  Soft and nontender without appreciable mass or hepatosplenomegaly.  Pulses:  Normal pulses noted. Extremities:  Without clubbing or edema.  Impression:  Pleasant 76 year old gentleman here to further evaluate an episode of rectal bleeding. Colonoscopy  indicated at this time.  Recommendations: I have  Offered  the patient a diagnostic colonoscopy today. The risks, benefits, limitations, alternatives and imponderables have been reviewed with the patient. Questions have been answered. All parties are agreeable.   Notice: This dictation was prepared with Dragon dictation along with smaller phrase technology. Any transcriptional errors that result from this process are unintentional and may not be corrected upon review.

## 2017-02-11 NOTE — Op Note (Signed)
[facility] Patient Name: Jason Johnson Procedure Date: 02/11/2017 6:59 AM MRN: [id number] Date of Birth: 05-05-1940 Attending MD: Jason Pac , MD CSN: [id number] Age: 76 Admit Type: Outpatient Procedure:                Colonoscopy Indications:              Hematochezia Providers:                Jason Pac, MD, Nena Polio, RN, Dyann Ruddle Referring MD:              Medicines:                Midazolam 3 mg IV, Meperidine 75 mg IV, Ondansetron                            4 mg IV Complications:            No immediate complications. Estimated Blood Loss:     Estimated blood loss: none. Procedure:                Pre-Anesthesia Assessment:                           - Prior to the procedure, a History and Physical                            was performed, and patient medications and                            allergies were reviewed. The patient's tolerance of                            previous anesthesia was also reviewed. The risks                            and benefits of the procedure and the sedation                            options and risks were discussed with the patient.                            All questions were answered, and informed consent                            was obtained. Prior Anticoagulants: The patient has                            taken no previous anticoagulant or antiplatelet                            agents. ASA Grade Assessment: II - A patient with  mild systemic disease. After reviewing the risks                            and benefits, the patient was deemed in                            satisfactory condition to undergo the procedure.                           After obtaining informed consent, the colonoscope                            was passed under direct vision. Throughout the                            procedure, the patient's blood pressure, pulse, and                             oxygen saturations were monitored continuously. The                            EC-[id number] ([id number]) scope was introduced through                            the anus and advanced to the the cecum, identified                            by appendiceal orifice and ileocecal valve. The                            colonoscopy was performed without difficulty. The                            patient tolerated the procedure well. The quality                            of the bowel preparation was adequate. The entire                            colon was well visualized. The ileocecal valve,                            appendiceal orifice, and rectum were photographed. Scope In: 7:43:55 AM Scope Out: 7:53:59 AM Scope Withdrawal Time: 0 hours 6 minutes 53 seconds  Total Procedure Duration: 0 hours 10 minutes 4 seconds  Findings:      The perianal and digital rectal examinations were normal.      Non-bleeding internal hemorrhoids were found during retroflexion. The       hemorrhoids were mild, small and Grade I (internal hemorrhoids that do       not prolapse).      Multiple small and large-mouthed diverticula were found in the entire       colon.      The exam was otherwise without abnormality on direct and retroflexion  views. Impression:               - Non-bleeding internal hemorrhoids.                           - Diverticulosis in the entire examined colon.                           - The examination was otherwise normal on direct                            and retroflexion views.                           - No specimens collected.     Last hemoglobin in                            the record is 12.7. I suspect patient suffered a                            self-limiting diverticular bleed Moderate Sedation:      Moderate (conscious) sedation was personally administered by an       anesthesia professional. The following parameters were monitored: oxygen       saturation, heart  rate, blood pressure, respiratory rate, EKG, adequacy       of pulmonary ventilation, and response to care. Total physician       intraservice time was 18 minutes. Recommendation:           - Patient has a contact number available for                            emergencies. The signs and symptoms of potential                            delayed complications were discussed with the                            patient. Return to normal activities tomorrow.                            Written discharge instructions were provided to the                            patient.                           - Continue present medications. H&H in 6 weeks. I                            do not recommend a future colonoscopy unless new                            symptoms develop.                           - Return to  GI clinic PRN.                           - Advance diet as tolerated.                           - Repeat colonoscopy is not recommended for                            screening purposes. Procedure Code(s):        --- Professional ---                           [phone], Colonoscopy, flexible; diagnostic, including                            collection of specimen(s) by brushing or washing,                            when performed (separate procedure) Diagnosis Code(s):        --- Professional ---                           K64.0, First degree hemorrhoids                           K92.1, Melena (includes Hematochezia)                           K57.30, Diverticulosis of large intestine without                            perforation or abscess without bleeding CPT copyright 2016 American Medical Association. All rights reserved. The codes documented in this report are preliminary and upon coder review may  be revised to meet current compliance requirements. Jason Friendsobert M. Kyrell Ruacho, MD Jason Pacobert Michael Genifer Lazenby, MD 02/11/2017 8:09:26 AM This report has been signed electronically. Number of Addenda: 0

## 2017-02-12 ENCOUNTER — Other Ambulatory Visit: Payer: Self-pay | Admitting: Cardiovascular Disease

## 2017-02-12 DIAGNOSIS — I6523 Occlusion and stenosis of bilateral carotid arteries: Secondary | ICD-10-CM

## 2017-02-13 ENCOUNTER — Encounter (HOSPITAL_COMMUNITY): Payer: Self-pay | Admitting: Internal Medicine

## 2017-02-24 DIAGNOSIS — Z0000 Encounter for general adult medical examination without abnormal findings: Secondary | ICD-10-CM | POA: Diagnosis not present

## 2017-02-24 DIAGNOSIS — Z1389 Encounter for screening for other disorder: Secondary | ICD-10-CM | POA: Diagnosis not present

## 2017-02-24 DIAGNOSIS — Z6829 Body mass index (BMI) 29.0-29.9, adult: Secondary | ICD-10-CM | POA: Diagnosis not present

## 2017-02-24 DIAGNOSIS — E663 Overweight: Secondary | ICD-10-CM | POA: Diagnosis not present

## 2017-04-09 ENCOUNTER — Ambulatory Visit: Payer: Medicare Other | Admitting: Nurse Practitioner

## 2017-04-13 ENCOUNTER — Encounter: Payer: Self-pay | Admitting: Nurse Practitioner

## 2017-04-13 ENCOUNTER — Ambulatory Visit (INDEPENDENT_AMBULATORY_CARE_PROVIDER_SITE_OTHER): Payer: Medicare Other | Admitting: Nurse Practitioner

## 2017-04-13 VITALS — BP 123/70 | HR 65 | Temp 97.0°F | Ht 72.0 in | Wt 210.8 lb

## 2017-04-13 DIAGNOSIS — K648 Other hemorrhoids: Secondary | ICD-10-CM | POA: Diagnosis not present

## 2017-04-13 DIAGNOSIS — K625 Hemorrhage of anus and rectum: Secondary | ICD-10-CM

## 2017-04-13 DIAGNOSIS — K5730 Diverticulosis of large intestine without perforation or abscess without bleeding: Secondary | ICD-10-CM | POA: Diagnosis not present

## 2017-04-13 NOTE — Assessment & Plan Note (Signed)
Noted internal hemorrhoids on last colonoscopy. No overt hemorrhoids symptoms or bleeding. Continue to monitor. Follow up as needed.

## 2017-04-13 NOTE — Assessment & Plan Note (Signed)
No recurrent rectal bleeding since his TCS. Hgb has normalized. Continue current medications. Follow-up as needed.

## 2017-04-13 NOTE — Progress Notes (Signed)
CC'ED TO PCP 

## 2017-04-13 NOTE — Assessment & Plan Note (Addendum)
Has pancolonic diverticula on TCS exam. Bleeding likely self-limiting diverticular bleed. Hgb appears to have normalized. Follow-up as needed for any further bleeding.

## 2017-04-13 NOTE — Patient Instructions (Signed)
1. Need to take your current prescription medications. 2. If you have hemorrhoid symptoms (rectal itching, irritation, pain, burning with or without bleeding) you can take the suppositories to help. 3. If you do see further bleeding, call our office. 4. Otherwise, return for follow-up as needed. 5. Call if you have any questions or concerns.

## 2017-04-13 NOTE — Progress Notes (Signed)
Referring Provider: Elfredia NevinsFusco, Lawrence, MD Primary Care Physician:  Elfredia NevinsFusco, Lawrence, MD Primary GI:  Dr. Jena Gaussourk  Chief Complaint  Patient presents with  . Rectal Bleeding    f/u. Improved. Has not had any recent episodes    HPI:   Jason Johnson is a 77 y.o. male who presents for follow-up on rectal bleeding.  The patient was last seen in our office 01-2017 for the same.  He did previously have low hemoglobin at 12.0 which improved the day prior to his previous visit to 12.7.  Noted intermittent/rare rectal bleeding most often with diarrhea.  Recommended colonoscopy after clearance from cardiology, follow-up in 3 months.  Colonoscopy completed 02/11/2017 which found nonbleeding internal hemorrhoids, diverticulosis in the entire colon, otherwise normal.  Deemed likely self-limiting diverticular bleed.  No routine repeat colonoscopy recommended.  Today he states he's doing well. No further GI bleed. Denies abdominal pain, N/V, hematochezia, melena, unintentional weight loss, fever, chills. Denies chest pain, dyspnea, dizziness, lightheadedness, syncope, near syncope. Denies any other upper or lower GI symptoms.  Past Medical History:  Diagnosis Date  . Carotid artery disease ([facility])   . Coronary artery disease    history of coronary artery bypass grafting in 1999  . Gout   . Hyperlipidemia   . Hypertension   . Myocardial infarction [facility]) 2009    Past Surgical History:  Procedure Laterality Date  . CARDIAC CATHETERIZATION  2009  . CATARACT EXTRACTION W/PHACO Right 09/09/2012   Procedure: CATARACT EXTRACTION PHACO AND INTRAOCULAR LENS PLACEMENT (IOC);  Surgeon: Gemma PayorKerry Hunt, MD;  Location: [facility] ORS;  Service: Ophthalmology;  Laterality: Right;  CDE 14.48  . CATARACT EXTRACTION W/PHACO Left 10/07/2012   Procedure: CATARACT EXTRACTION PHACO AND INTRAOCULAR LENS PLACEMENT (IOC);  Surgeon: Gemma PayorKerry Hunt, MD;  Location: [facility]S;  Service: Ophthalmology;  Laterality: Left;  CDE:12.24  . COLONOSCOPY    .  COLONOSCOPY N/A 02/11/2017   Procedure: COLONOSCOPY;  Surgeon: Corbin Adeourk, Robert M, MD;  Location: [facility] END[facility] SUITE;  Service: Endoscopy;  Laterality: N/A;  7:30am  . CORONARY ARTERY BYPASS GRAFT  2004   triple  . DOPPLER ECHOCARDIOGRAPHY  2010  . NM MYOVIEW LTD  2012  . TONSILLECTOMY     as a child    Current Outpatient Medications  Medication Sig Dispense Refill  . aspirin EC 325 MG tablet Take 162.5 mg by mouth daily.    . carvedilol (COREG) 12.5 MG tablet Take 1 tablet (12.5 mg total) by mouth 2 (two) times daily with a meal. 180 tablet 3  . clopidogrel (PLAVIX) 75 MG tablet Take 1 tablet (75 mg total) by mouth daily. 90 tablet 3  . ibuprofen (ADVIL,MOTRIN) 200 MG tablet Take 400-600 mg by mouth daily as needed for moderate pain.    . nitroGLYCERIN (NITROSTAT) 0.4 MG SL tablet Place 1 tablet (0.4 mg total) under the tongue every 5 (five) minutes as needed for chest pain. 25 tablet 4  . simvastatin (ZOCOR) 40 MG tablet Take 1 tablet (40 mg total) by mouth daily. (Patient taking differently: Take 40 mg by mouth every evening. ) 90 tablet 3   No current facility-administered medications for this visit.     Allergies as of 04/13/2017 - Review Complete 04/13/2017  Allergen Reaction Noted  . Levaquin [levofloxacin] Itching and Rash 09/09/2012    Family History  Problem Relation Age of Onset  . Colon cancer Neg Hx     Social History   Socioeconomic History  . Marital status: Married  Spouse name: None  . Number of children: None  . Years of education: None  . Highest education level: None  Social Needs  . Financial resource strain: None  . Food insecurity - worry: None  . Food insecurity - inability: None  . Transportation needs - medical: None  . Transportation needs - non-medical: None  Occupational History  . None  Tobacco Use  . Smoking status: Never Smoker  . Smokeless tobacco: Never Used  Substance and Sexual Activity  . Alcohol use: Yes    Comment: Occasionally  .  Drug use: No  . Sexual activity: None  Other Topics Concern  . None  Social History Narrative  . None    Review of Systems: General: Negative for anorexia, weight loss, fever, chills, fatigue, weakness. Eyes: Negative for vision changes.  ENT: Negative for hoarseness, difficulty swallowing , nasal congestion. CV: Negative for chest pain, angina, palpitations, dyspnea on exertion, peripheral edema.  Respiratory: Negative for dyspnea at rest, dyspnea on exertion, cough, sputum, wheezing.  GI: See history of present illness. GU:  Negative for dysuria, hematuria, urinary incontinence, urinary frequency, nocturnal urination.  MS: Negative for joint pain, low back pain.  Derm: Negative for rash or itching.  Neuro: Negative for weakness, abnormal sensation, seizure, frequent headaches, memory loss, confusion.  Psych: Negative for anxiety, depression, suicidal ideation, hallucinations.  Endo: Negative for unusual weight change.  Heme: Negative for bruising or bleeding. Allergy: Negative for rash or hives.   Physical Exam: BP 123/70   Pulse 65   Temp (!) 97 F (36.1 C) (Oral)   Ht 6' (1.829 m)   Wt 210 lb 12.8 oz (95.6 kg)   BMI 28.59 kg/m  General:   Alert and oriented. Pleasant and cooperative. Well-nourished and well-developed.  Head:  Normocephalic and atraumatic. Eyes:  Without icterus, sclera clear and conjunctiva pink.  Ears:  Normal auditory acuity. Mouth:  No deformity or lesions, oral mucosa pink.  Throat/Neck:  Supple, without mass or thyromegaly. Cardiovascular:  S1, S2 present without murmurs appreciated. Normal pulses noted. Extremities without clubbing or edema. Respiratory:  Clear to auscultation bilaterally. No wheezes, rales, or rhonchi. No distress.  Gastrointestinal:  +BS, soft, non-tender and non-distended. No HSM noted. No guarding or rebound. No masses appreciated.  Rectal:  Deferred  Musculoskalatal:  Symmetrical without gross deformities. Normal  posture. Skin:  Intact without significant lesions or rashes. Neurologic:  Alert and oriented x4;  grossly normal neurologically. Psych:  Alert and cooperative. Normal mood and affect. Heme/Lymph/Immune: No significant cervical adenopathy. No excessive bruising noted.    04/13/2017 10:40 AM   Disclaimer: This note was dictated with voice recognition software. Similar sounding words can inadvertently be transcribed and may not be corrected upon review.

## 2017-05-19 ENCOUNTER — Other Ambulatory Visit: Payer: Self-pay

## 2017-05-19 ENCOUNTER — Telehealth: Payer: Self-pay | Admitting: Internal Medicine

## 2017-05-19 DIAGNOSIS — K921 Melena: Secondary | ICD-10-CM

## 2017-05-19 NOTE — Telephone Encounter (Signed)
Pt notified, orders were placed. Pt will have labs done at [facility]

## 2017-05-19 NOTE — Telephone Encounter (Signed)
Per RMR recommendations in 11/7 procedure note patient to have H&H done

## 2017-06-10 DIAGNOSIS — Z6830 Body mass index (BMI) 30.0-30.9, adult: Secondary | ICD-10-CM | POA: Diagnosis not present

## 2017-06-10 DIAGNOSIS — M542 Cervicalgia: Secondary | ICD-10-CM | POA: Diagnosis not present

## 2017-06-10 DIAGNOSIS — I2510 Atherosclerotic heart disease of native coronary artery without angina pectoris: Secondary | ICD-10-CM | POA: Diagnosis not present

## 2017-06-10 DIAGNOSIS — E6609 Other obesity due to excess calories: Secondary | ICD-10-CM | POA: Diagnosis not present

## 2017-06-10 DIAGNOSIS — I779 Disorder of arteries and arterioles, unspecified: Secondary | ICD-10-CM | POA: Diagnosis not present

## 2017-06-10 DIAGNOSIS — M47812 Spondylosis without myelopathy or radiculopathy, cervical region: Secondary | ICD-10-CM | POA: Diagnosis not present

## 2017-07-17 DIAGNOSIS — M62838 Other muscle spasm: Secondary | ICD-10-CM | POA: Diagnosis not present

## 2017-07-17 DIAGNOSIS — E663 Overweight: Secondary | ICD-10-CM | POA: Diagnosis not present

## 2017-07-17 DIAGNOSIS — Z6829 Body mass index (BMI) 29.0-29.9, adult: Secondary | ICD-10-CM | POA: Diagnosis not present

## 2017-07-29 DIAGNOSIS — M542 Cervicalgia: Secondary | ICD-10-CM | POA: Diagnosis not present

## 2017-07-29 DIAGNOSIS — M6281 Muscle weakness (generalized): Secondary | ICD-10-CM | POA: Diagnosis not present

## 2017-07-29 DIAGNOSIS — R293 Abnormal posture: Secondary | ICD-10-CM | POA: Diagnosis not present

## 2017-07-29 DIAGNOSIS — M2560 Stiffness of unspecified joint, not elsewhere classified: Secondary | ICD-10-CM | POA: Diagnosis not present

## 2017-08-03 DIAGNOSIS — M542 Cervicalgia: Secondary | ICD-10-CM | POA: Diagnosis not present

## 2017-08-03 DIAGNOSIS — M6281 Muscle weakness (generalized): Secondary | ICD-10-CM | POA: Diagnosis not present

## 2017-08-03 DIAGNOSIS — M2560 Stiffness of unspecified joint, not elsewhere classified: Secondary | ICD-10-CM | POA: Diagnosis not present

## 2017-08-03 DIAGNOSIS — R293 Abnormal posture: Secondary | ICD-10-CM | POA: Diagnosis not present

## 2017-12-18 ENCOUNTER — Telehealth: Payer: Self-pay | Admitting: Cardiovascular Disease

## 2017-12-18 DIAGNOSIS — I6523 Occlusion and stenosis of bilateral carotid arteries: Secondary | ICD-10-CM

## 2017-12-18 DIAGNOSIS — I10 Essential (primary) hypertension: Secondary | ICD-10-CM

## 2017-12-18 DIAGNOSIS — E7800 Pure hypercholesterolemia, unspecified: Secondary | ICD-10-CM

## 2017-12-18 NOTE — Telephone Encounter (Signed)
New Message:   Patient calling he is requesting for all  Lab work  To be done at  [facility] [place] . He is a Dr. Allyson SabalBerry pt

## 2017-12-18 NOTE — Telephone Encounter (Signed)
Returned call to patient left message on personal voice mail ok to have fasting lipid and hepatic panels done in [place].Lab orders mailed.

## 2017-12-26 IMAGING — US US CAROTID DUPLEX BILAT
1 series · 13 of 24 positions shown · non-contrast
Comparison: 01/31/2016

CLINICAL DATA: Carotid atherosclerosis, hypertension,
hyperlipidemia

EXAM:
BILATERAL CAROTID DUPLEX ULTRASOUND
TECHNIQUE: Gray scale imaging, color Doppler and duplex ultrasound were
performed of bilateral carotid and vertebral arteries in the neck.

[Series 1: us carotid duplex bilat · 0.06mm/px · 13 of 72 slices shown]
[im 1/72]
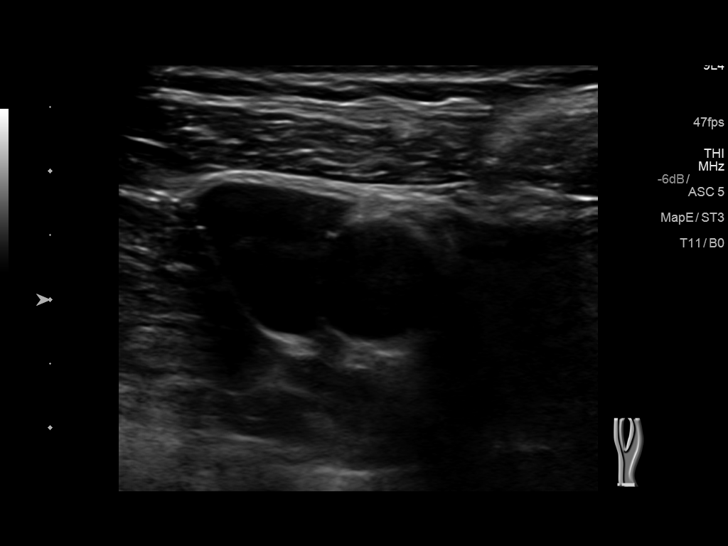
[im 7/72]
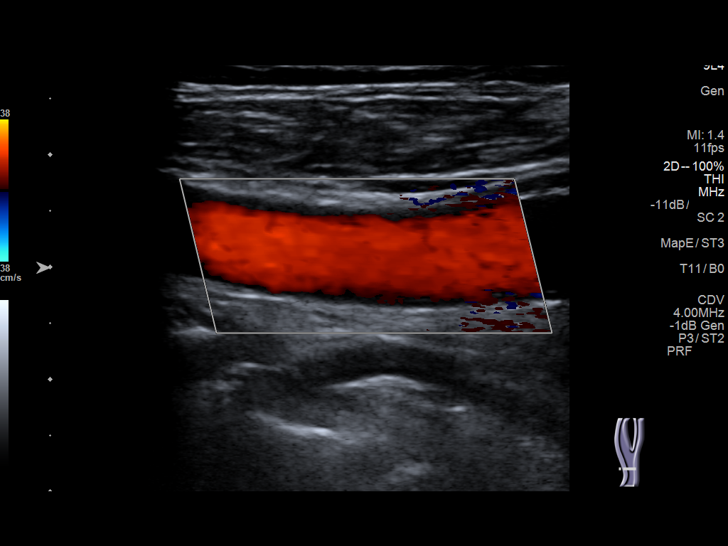
[im 13/72]
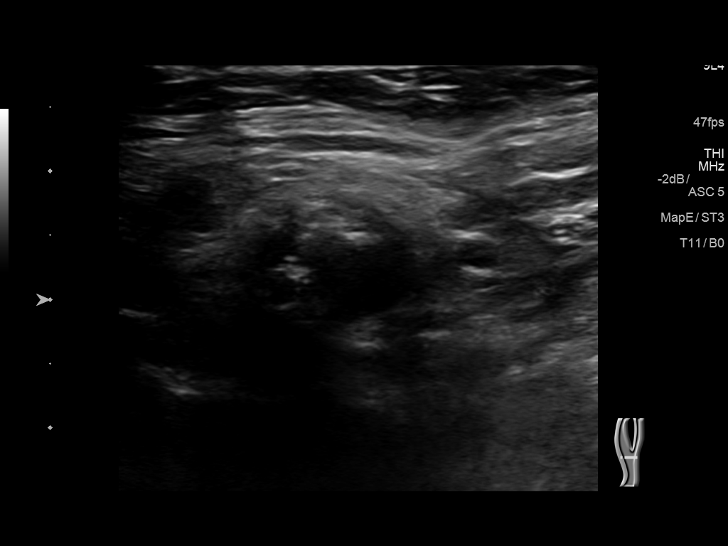
[im 19/72]
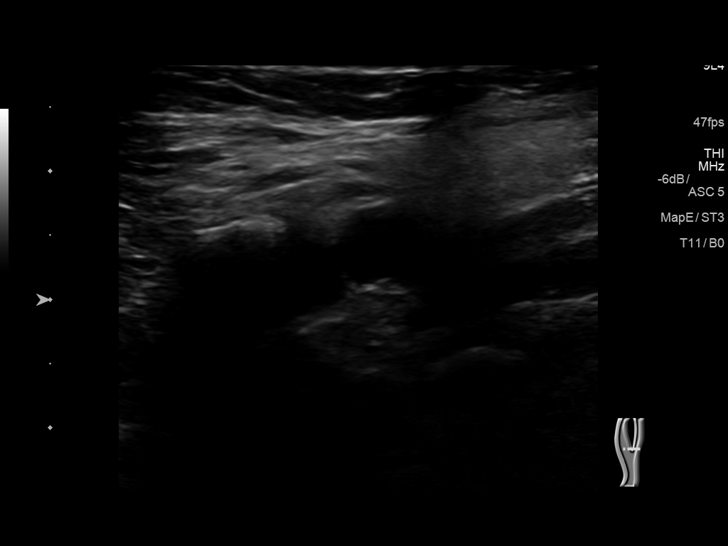
[im 25/72]
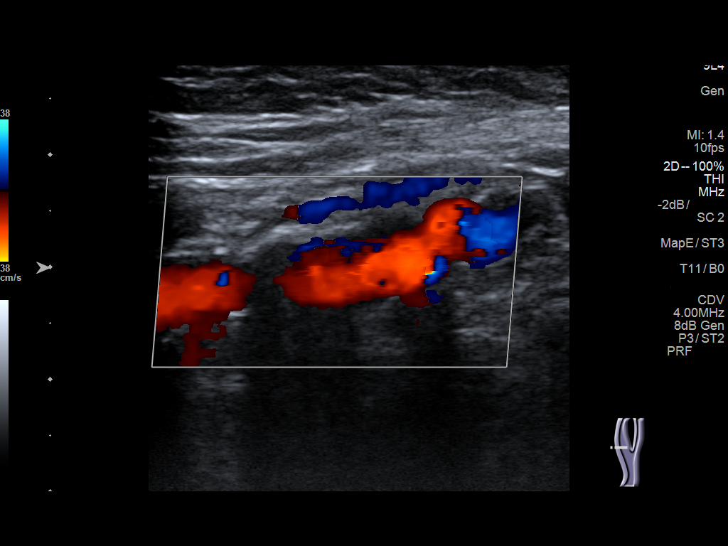
[im 31/72]
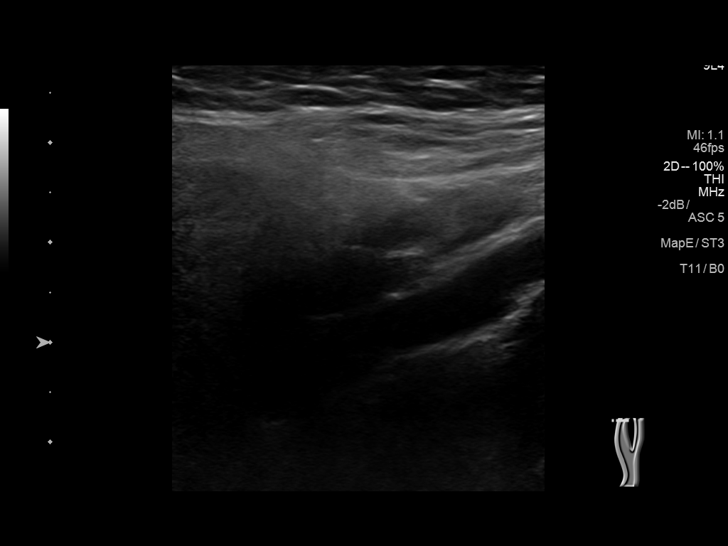
[im 38/72]
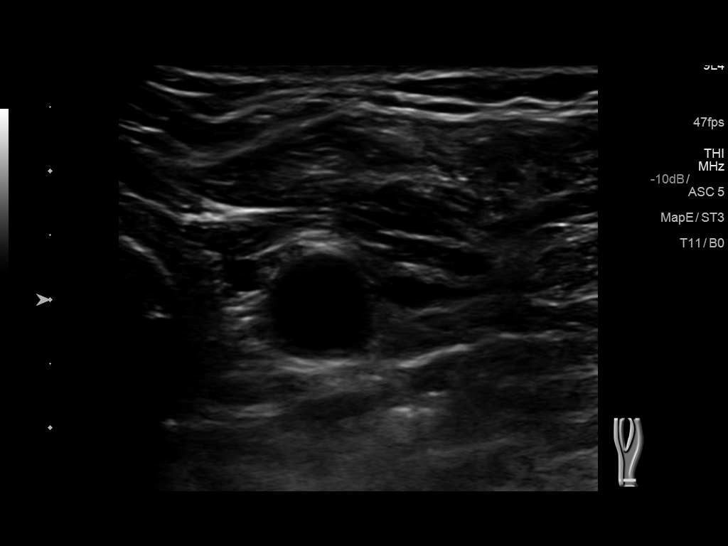
[im 41/72]
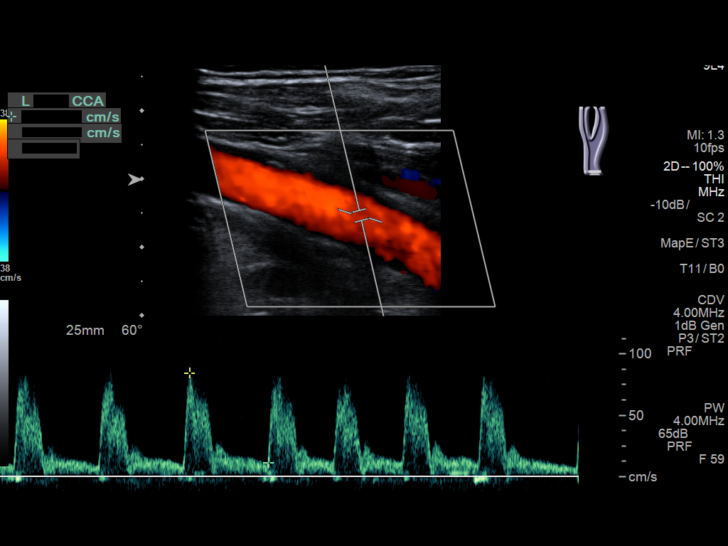
[im 47/72]
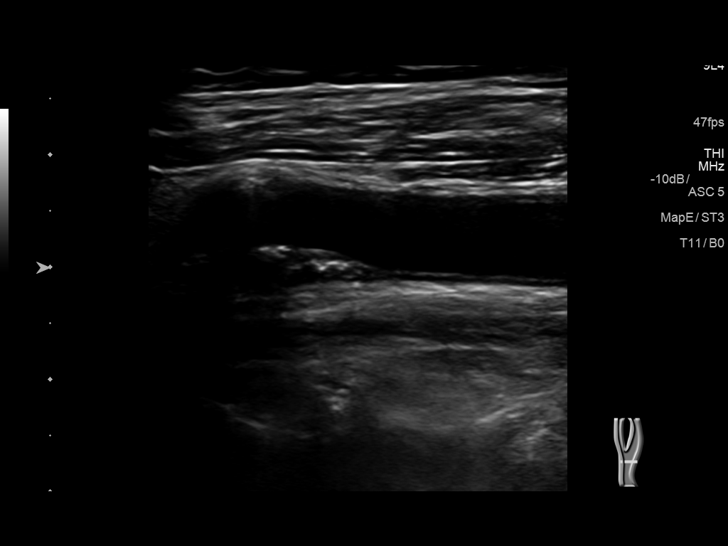
[im 53/72]
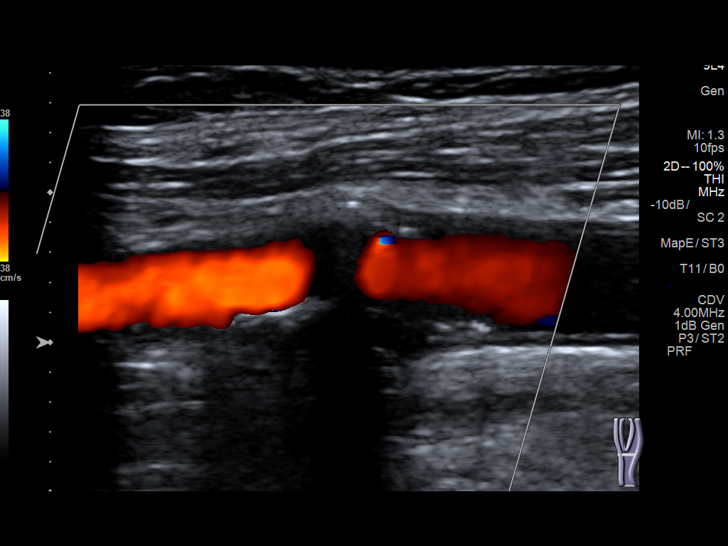
[im 59/72]
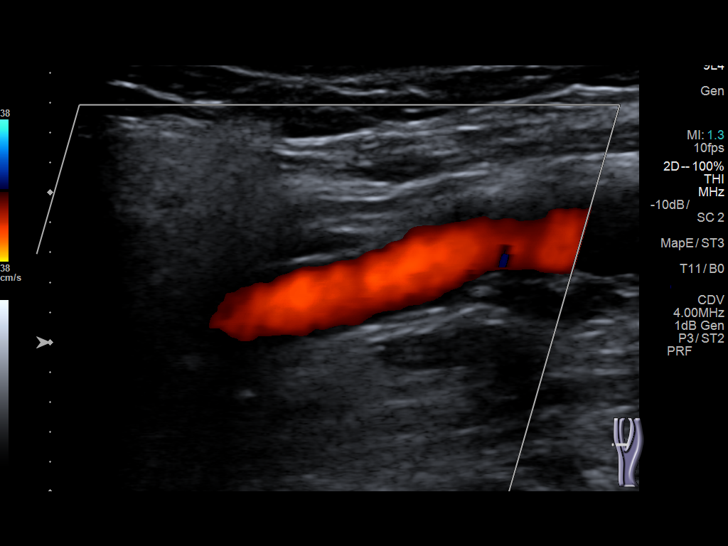
[im 65/72]
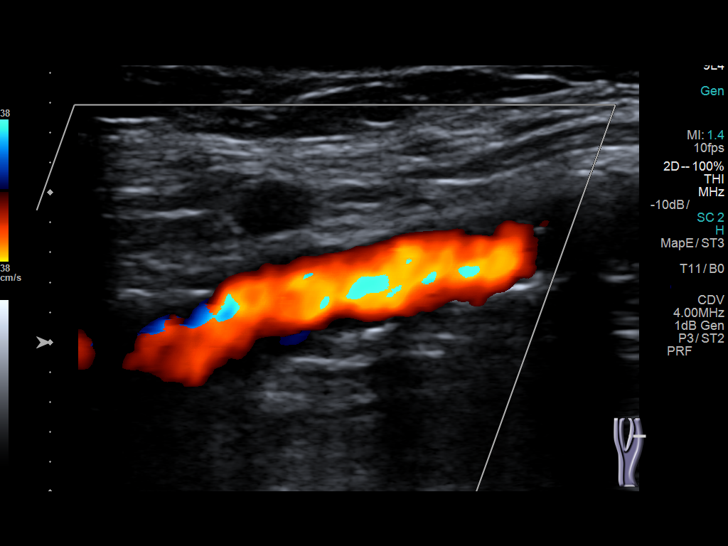
[im 72/72]
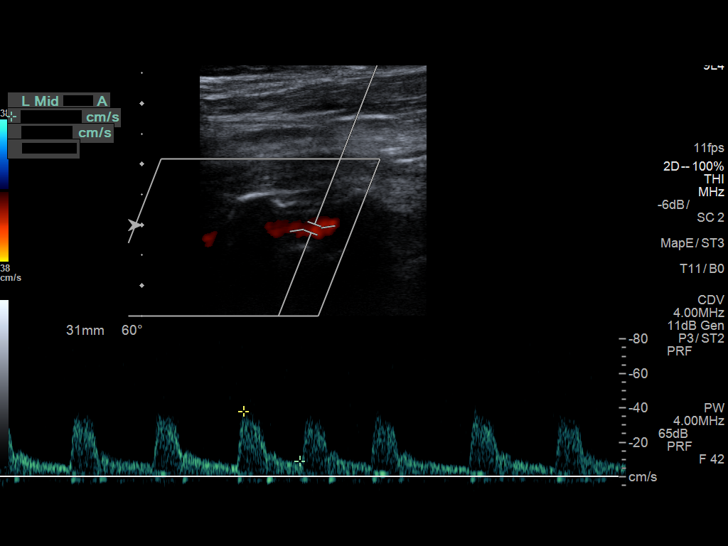

[13 of 24 positions shown; findings below may reference images not displayed]

FINDINGS: Criteria: Quantification of carotid stenosis is based on velocity
parameters that correlate the residual internal carotid diameter
with NASCET-based stenosis levels, using the diameter of the distal
internal carotid lumen as the denominator for stenosis measurement.

The following velocity measurements were obtained:

RIGHT

ICA:  270/46 cm/sec

CCA:  63/11 cm/sec

SYSTOLIC ICA/CCA RATIO:

DIASTOLIC ICA/CCA RATIO:

ECA:  170 cm/sec

LEFT

ICA:  156/38 cm/sec

CCA:  82/13 cm/sec

SYSTOLIC ICA/CCA RATIO:

DIASTOLIC ICA/CCA RATIO:

ECA:  124 cm/sec

RIGHT CAROTID ARTERY: Heterogeneous mixed echogenicity right carotid
bifurcation atherosclerosis which narrows the proximal ICA lumen by
grayscale imaging. Turbulent flow noted in the right proximal ICA
with mild spectral broadening. Proximal right ICA stenosis remains
estimated at greater than 70% by ultrasound criteria.

RIGHT VERTEBRAL ARTERY:  Antegrade

LEFT CAROTID ARTERY: Moderate heterogeneous calcified left carotid
bifurcation atherosclerosis. Minor turbulent flow and velocity
elevation measuring 156/38 cm/sec. Left ICA stenosis is estimated at
50- 69% by ultrasound criteria (closer to the 50% range.)

LEFT VERTEBRAL ARTERY:  Antegrade
IMPRESSION: Right greater than Left carotid atherosclerosis.

Right ICA stenosis remains estimated at greater than 70% by
ultrasound criteria

Moderate left ICA stenosis estimated at 50- 69% by ultrasound
criteria (closer to the 50% range).

Patent antegrade vertebral flow bilaterally

## 2018-01-07 ENCOUNTER — Encounter: Payer: Self-pay | Admitting: Gastroenterology

## 2018-01-07 ENCOUNTER — Ambulatory Visit (INDEPENDENT_AMBULATORY_CARE_PROVIDER_SITE_OTHER): Payer: Medicare Other | Admitting: Gastroenterology

## 2018-01-07 VITALS — BP 148/87 | HR 93 | Temp 97.0°F | Ht 72.0 in | Wt 208.4 lb

## 2018-01-07 DIAGNOSIS — K625 Hemorrhage of anus and rectum: Secondary | ICD-10-CM | POA: Diagnosis not present

## 2018-01-07 DIAGNOSIS — I6523 Occlusion and stenosis of bilateral carotid arteries: Secondary | ICD-10-CM

## 2018-01-07 NOTE — Patient Instructions (Signed)
1. I suspect you have had self-limiting bleeding from a diverticular pocket or hemorrhoids, but if persistent bleeding or increased bleeding, lightheadedness, shortness of breath, fatigue go to the ER for evaluation.  2. Hold your aspirin for few days or until bleeding has stopped.    High-Fiber Diet Fiber, also called dietary fiber, is a type of carbohydrate found in fruits, vegetables, whole grains, and beans. A high-fiber diet can have many health benefits. Your health care provider may recommend a high-fiber diet to help:  Prevent constipation. Fiber can make your bowel movements more regular.  Lower your cholesterol.  Relieve hemorrhoids, uncomplicated diverticulosis, or irritable bowel syndrome.  Prevent overeating as part of a weight-loss plan.  Prevent heart disease, type 2 diabetes, and certain cancers.  What is my plan? The recommended daily intake of fiber includes:  38 grams for men under age 67.  30 grams for men over age 57.  25 grams for women under age 33.  21 grams for women over age 74.  You can get the recommended daily intake of dietary fiber by eating a variety of fruits, vegetables, grains, and beans. Your health care provider may also recommend a fiber supplement if it is not possible to get enough fiber through your diet. What do I need to know about a high-fiber diet?  Fiber supplements have not been widely studied for their effectiveness, so it is better to get fiber through food sources.  Always check the fiber content on thenutrition facts label of any prepackaged food. Look for foods that contain at least 5 grams of fiber per serving.  Ask your dietitian if you have questions about specific foods that are related to your condition, especially if those foods are not listed in the following section.  Increase your daily fiber consumption gradually. Increasing your intake of dietary fiber too quickly may cause bloating, cramping, or gas.  Drink plenty  of water. Water helps you to digest fiber. What foods can I eat? Grains Whole-grain breads. Multigrain cereal. Oats and oatmeal. Brown rice. Barley. Bulgur wheat. Millet. Bran muffins. Popcorn. Rye wafer crackers. Vegetables Sweet potatoes. Spinach. Kale. Artichokes. Cabbage. Broccoli. Green peas. Carrots. Squash. Fruits Berries. Pears. Apples. Oranges. Avocados. Prunes and raisins. Dried figs. Meats and Other Protein Sources Navy, kidney, pinto, and soy beans. Split peas. Lentils. Nuts and seeds. Dairy Fiber-fortified yogurt. Beverages Fiber-fortified soy milk. Fiber-fortified orange juice. Other Fiber bars. The items listed above may not be a complete list of recommended foods or beverages. Contact your dietitian for more options. What foods are not recommended? Grains White bread. Pasta made with refined flour. White rice. Vegetables Fried potatoes. Canned vegetables. Well-cooked vegetables. Fruits Fruit juice. Cooked, strained fruit. Meats and Other Protein Sources Fatty cuts of meat. Fried Environmental education officer or fried fish. Dairy Milk. Yogurt. Cream cheese. Sour cream. Beverages Soft drinks. Other Cakes and pastries. Butter and oils. The items listed above may not be a complete list of foods and beverages to avoid. Contact your dietitian for more information. What are some tips for including high-fiber foods in my diet?  Eat a wide variety of high-fiber foods.  Make sure that half of all grains consumed each day are whole grains.  Replace breads and cereals made from refined flour or white flour with whole-grain breads and cereals.  Replace white rice with brown rice, bulgur wheat, or millet.  Start the day with a breakfast that is high in fiber, such as a cereal that contains at least 5 grams of  fiber per serving.  Use beans in place of meat in soups, salads, or pasta.  Eat high-fiber snacks, such as berries, raw vegetables, nuts, or popcorn. This information is not intended  to replace advice given to you by your health care provider. Make sure you discuss any questions you have with your health care provider. Document Released: 03/24/2005 Document Revised: 08/30/2015 Document Reviewed: 09/06/2013 Elsevier Interactive Patient Education  2018 [employer].     Diverticulosis Diverticulosis is a condition that develops when small pouches (diverticula) form in the wall of the large intestine (colon). The colon is where water is absorbed and stool is formed. The pouches form when the inside layer of the colon pushes through weak spots in the outer layers of the colon. You may have a few pouches or many of them. What are the causes? The cause of this condition is not known. What increases the risk? The following factors may make you more likely to develop this condition:  Being older than age 27. Your risk for this condition increases with age. Diverticulosis is rare among people younger than age 73. By age 15, many people have it.  Eating a low-fiber diet.  Having frequent constipation.  Being overweight.  Not getting enough exercise.  Smoking.  Taking over-the-counter pain medicines, like aspirin and ibuprofen.  Having a family history of diverticulosis.  What are the signs or symptoms? In most people, there are no symptoms of this condition. If you do have symptoms, they may include:  Bloating.  Cramps in the abdomen.  Constipation or diarrhea.  Pain in the lower left side of the abdomen.  How is this diagnosed? This condition is most often diagnosed during an exam for other colon problems. Because diverticulosis usually has no symptoms, it often cannot be diagnosed independently. This condition may be diagnosed by:  Using a flexible scope to examine the colon (colonoscopy).  Taking an X-ray of the colon after dye has been put into the colon (barium enema).  Doing a CT scan.  How is this treated? You may not need treatment for this  condition if you have never developed an infection related to diverticulosis. If you have had an infection before, treatment may include:  Eating a high-fiber diet. This may include eating more fruits, vegetables, and grains.  Taking a fiber supplement.  Taking a live bacteria supplement (probiotic).  Taking medicine to relax your colon.  Taking antibiotic medicines.  Follow these instructions at home:  Drink 6-8 glasses of water or more each day to prevent constipation.  Try not to strain when you have a bowel movement.  If you have had an infection before: ? Eat more fiber as directed by your health care provider or your diet and nutrition specialist (dietitian). ? Take a fiber supplement or probiotic, if your health care provider approves.  Take over-the-counter and prescription medicines only as told by your health care provider.  If you were prescribed an antibiotic, take it as told by your health care provider. Do not stop taking the antibiotic even if you start to feel better.  Keep all follow-up visits as told by your health care provider. This is important. Contact a health care provider if:  You have pain in your abdomen.  You have bloating.  You have cramps.  You have not had a bowel movement in 3 days. Get help right away if:  Your pain gets worse.  Your bloating becomes very bad.  You have a fever or chills,  and your symptoms suddenly get worse.  You vomit.  You have bowel movements that are bloody or black.  You have bleeding from your rectum. Summary  Diverticulosis is a condition that develops when small pouches (diverticula) form in the wall of the large intestine (colon).  You may have a few pouches or many of them.  This condition is most often diagnosed during an exam for other colon problems.  If you have had an infection related to diverticulosis, treatment may include increasing the fiber in your diet, taking supplements, or taking  medicines. This information is not intended to replace advice given to you by your health care provider. Make sure you discuss any questions you have with your health care provider. Document Released: 12/20/2003 Document Revised: 02/11/2016 Document Reviewed: 02/11/2016 Elsevier Interactive Patient Education  2017 [employer].

## 2018-01-07 NOTE — Assessment & Plan Note (Signed)
Acute onset rectal bleeding today.  Similar to but not as bad as the episode 1 year ago when he was suspected to have a health limiting diverticular bleed.  Colonoscopy is up-to-date.  No objective findings to concern for significant anemia.  Patient declined rectal exam today.  For now we will continue to monitor.  If he has persistent rectal bleeding, increase in amount of bleeding, lightheadedness, fatigue, shortness of breath he should go to the emergency department.  We will hold his aspirin for the next few days.  Patient declined checking hemoglobin at this point, he will call if he changes his mind.  Discussed appropriate diet for diverticulosis.  Handout provided.

## 2018-01-07 NOTE — Progress Notes (Signed)
Primary Care Physician: Elfredia Nevins, MD  Primary Gastroenterologist:  Roetta Sessions, MD   Chief Complaint  Patient presents with  . Blood In Stools    happened twice today, bright red, stool is not diarrhea like, stool seems soft/formed    HPI: Jason Johnson is a 77 y.o. male here for urgent office visit for rectal bleeding.  Patient has a history of rectal bleeding back about a year ago, evaluation at that time included colonoscopy which revealed diverticulosis and internal hemorrhoids and it was felt that he had a self-limited diverticular bleed.   This morning patient has had 2 episodes of rectal bleeding.  Has been formed.  No melena.  Moderate amount of bright red blood on the stool, in the toilet water as well as the tissue.  Denies lightheadedness, shortness of breath, fatigue.  Feels fine otherwise.  No abdominal pain or rectal pain.  Appetite normal.  He takes Plavix and aspirin daily.  Rarely takes ibuprofen.   Current Outpatient Medications  Medication Sig Dispense Refill  . aspirin EC 325 MG tablet Take 162.5 mg by mouth daily.    . carvedilol (COREG) 12.5 MG tablet Take 1 tablet (12.5 mg total) by mouth 2 (two) times daily with a meal. 180 tablet 3  . clopidogrel (PLAVIX) 75 MG tablet Take 1 tablet (75 mg total) by mouth daily. 90 tablet 3  . ibuprofen (ADVIL,MOTRIN) 200 MG tablet Take 400-600 mg by mouth daily as needed for moderate pain.    . nitroGLYCERIN (NITROSTAT) 0.4 MG SL tablet Place 1 tablet (0.4 mg total) under the tongue every 5 (five) minutes as needed for chest pain. 25 tablet 4  . simvastatin (ZOCOR) 40 MG tablet Take 1 tablet (40 mg total) by mouth daily. (Patient taking differently: Take 40 mg by mouth every evening. ) 90 tablet 3   No current facility-administered medications for this visit.     Allergies as of 01/07/2018 - Review Complete 01/07/2018  Allergen Reaction Noted  . Levaquin [levofloxacin] Itching and Rash 09/09/2012     ROS:  General: Negative for anorexia, weight loss, fever, chills, fatigue, weakness. ENT: Negative for hoarseness, difficulty swallowing , nasal congestion. CV: Negative for chest pain, angina, palpitations, dyspnea on exertion, peripheral edema.  Respiratory: Negative for dyspnea at rest, dyspnea on exertion, cough, sputum, wheezing.  GI: See history of present illness. GU:  Negative for dysuria, hematuria, urinary incontinence, urinary frequency, nocturnal urination.  Endo: Negative for unusual weight change.    Physical Examination:   BP (!) 148/87   Pulse 93   Temp (!) 97 F (36.1 C) (Oral)   Ht 6' (1.829 m)   Wt 208 lb 6.4 oz (94.5 kg)   BMI 28.26 kg/m   General: Well-nourished, well-developed in no acute distress.  Eyes: No icterus. Mouth: Oropharyngeal mucosa moist and pink , no lesions erythema or exudate. Lungs: Clear to auscultation bilaterally.  Heart: Regular rate and rhythm, no murmurs rubs or gallops.  Abdomen: Bowel sounds are normal, nontender, nondistended, no hepatosplenomegaly or masses, no abdominal bruits or hernia , no rebound or guarding.   RECTAL: PATIENT DECLINED Extremities: No lower extremity edema. No clubbing or deformities. Neuro: Alert and oriented x 4   Skin: Warm and dry, no jaundice.   Psych: Alert and cooperative, normal mood and affect.  Labs:   Lab Results  Component Value Date   ALT 14 01/23/2017   AST 16 01/23/2017   ALKPHOS 54 01/23/2017  BILITOT 0.6 01/23/2017   Lab Results  Component Value Date   WBC 6.5 01/05/2017   HGB 12.7 (L) 01/05/2017   HCT 38.6 01/05/2017   MCV 96 01/05/2017   PLT 256 01/05/2017    Imaging Studies: No results found.

## 2018-01-11 NOTE — Progress Notes (Signed)
CC'D TO PCP °

## 2018-01-19 DIAGNOSIS — Z23 Encounter for immunization: Secondary | ICD-10-CM | POA: Diagnosis not present

## 2018-02-04 ENCOUNTER — Ambulatory Visit (INDEPENDENT_AMBULATORY_CARE_PROVIDER_SITE_OTHER): Payer: Medicare Other

## 2018-02-04 DIAGNOSIS — I6523 Occlusion and stenosis of bilateral carotid arteries: Secondary | ICD-10-CM | POA: Diagnosis not present

## 2018-02-08 DIAGNOSIS — E7800 Pure hypercholesterolemia, unspecified: Secondary | ICD-10-CM | POA: Diagnosis not present

## 2018-02-08 DIAGNOSIS — I6523 Occlusion and stenosis of bilateral carotid arteries: Secondary | ICD-10-CM | POA: Diagnosis not present

## 2018-02-08 DIAGNOSIS — I10 Essential (primary) hypertension: Secondary | ICD-10-CM | POA: Diagnosis not present

## 2018-02-09 ENCOUNTER — Telehealth: Payer: Self-pay | Admitting: *Deleted

## 2018-02-09 DIAGNOSIS — I6523 Occlusion and stenosis of bilateral carotid arteries: Secondary | ICD-10-CM

## 2018-02-09 DIAGNOSIS — E7800 Pure hypercholesterolemia, unspecified: Secondary | ICD-10-CM

## 2018-02-09 NOTE — Telephone Encounter (Signed)
-----   Message from Jonathan J Berry, MD sent at 02/05/2018  7:51 AM EDT ----- Slight increase in bilateral ICA velocities.  Repeat 12 months 

## 2018-02-09 NOTE — Telephone Encounter (Signed)
called left message to call back for results and to schedule annual appointment for Dr Allyson Sabal for 2019  Order placed for 2020

## 2018-02-10 ENCOUNTER — Encounter: Payer: Self-pay | Admitting: Cardiovascular Disease

## 2018-02-11 NOTE — Telephone Encounter (Signed)
-----   Message from Runell Gess, MD sent at 02/05/2018  7:51 AM EDT ----- Slight increase in bilateral ICA velocities.  Repeat 12 months

## 2018-02-11 NOTE — Telephone Encounter (Signed)
Pt aware of his Carotid results with verbal understanding. Pt is scheduled to see Dr.Berry on 11/26 @ 8am. Pt sts that he had his recent lipid and hepatic lab at his local [facility]. Adv pt that results were scanned in on 11/6. I will fwd the message to Dr.Berry to review.

## 2018-02-11 NOTE — Telephone Encounter (Signed)
Follow up:   Patient returning call from yesterday. Please call Patient back.

## 2018-03-01 DIAGNOSIS — E6609 Other obesity due to excess calories: Secondary | ICD-10-CM | POA: Diagnosis not present

## 2018-03-01 DIAGNOSIS — Z1389 Encounter for screening for other disorder: Secondary | ICD-10-CM | POA: Diagnosis not present

## 2018-03-01 DIAGNOSIS — Z0001 Encounter for general adult medical examination with abnormal findings: Secondary | ICD-10-CM | POA: Diagnosis not present

## 2018-03-01 DIAGNOSIS — Z6830 Body mass index (BMI) 30.0-30.9, adult: Secondary | ICD-10-CM | POA: Diagnosis not present

## 2018-03-02 ENCOUNTER — Ambulatory Visit (INDEPENDENT_AMBULATORY_CARE_PROVIDER_SITE_OTHER): Payer: Medicare Other | Admitting: Cardiovascular Disease

## 2018-03-02 ENCOUNTER — Encounter: Payer: Self-pay | Admitting: Cardiovascular Disease

## 2018-03-02 VITALS — BP 146/60 | HR 68 | Ht 72.0 in | Wt 212.4 lb

## 2018-03-02 DIAGNOSIS — E7800 Pure hypercholesterolemia, unspecified: Secondary | ICD-10-CM

## 2018-03-02 DIAGNOSIS — I6523 Occlusion and stenosis of bilateral carotid arteries: Secondary | ICD-10-CM | POA: Diagnosis not present

## 2018-03-02 MED ORDER — ASPIRIN EC 81 MG PO TBEC: 81.0000 mg | DELAYED_RELEASE_TABLET | Freq: Every day | ORAL | 3 refills | Status: AC

## 2018-03-02 NOTE — Assessment & Plan Note (Signed)
History of moderate bilateral ICA stenosis by duplex ultrasound recently checked 02/04/2018.  This will be checked on an annual basis.

## 2018-03-02 NOTE — Assessment & Plan Note (Signed)
History of CAD status post coronary artery bypass grafting at the [facility] in [place] by Dr. Julaine FusiPaul Corso back in 1999.  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 Lavonne ChickBill Gamble.  His last Myoview performed 10/08/2010 was nonischemic.  He denies chest pain or shortness of breath.

## 2018-03-02 NOTE — Progress Notes (Signed)
03/02/2018 Jason Johnson   [birth year]  [id number]  Primary Physician Elfredia Nevins, MD Primary Cardiologist: Runell Gess MD Nicholes Calam[place], [place]  HPI:  Jason Johnson is a 77 y.o.  mildly overweight, married Caucasian male, father to 2, (father to 3 stepchildren,) grandfather to 5 great-grandchildren who I last saw in the office  01/28/2017. He has a history of CAD status post coronary artery bypass grafting by Dr. Julaine Fusi at the [facility] in [place], [place] back in 1999. His risk factors include hypertension and hyperlipidemia. 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 He underwent AngioJet mechanical aspiration thrombectomy, angioplasty and stenting of that graft and has done well since. He denies chest pain or shortness of breath. He had a Myoview performed in our office October 08, 2010, that was remarkable for a very subtle area of inferior ischemia involving inferior septum towards the base with some inferior scar with some mild peri-infarct ischemia. His lipid profile performed  02/08/2018 revealed total cholesterol  142, LDL 72 and HDL of 50.  He had carotid Dopplers performed in our office 02/04/2018 that showed moderately severe right and moderate left ICA stenosis which we have been following by serial duplex ultrasounds. He is neurologically asymptomatic on aspirin and Plavix.  Since I saw him a year ago he denies chest pain or shortness of breath.  He is very active.   Current Meds  Medication Sig  . aspirin EC 325 MG tablet Take 162.5 mg by mouth daily.  . carvedilol (COREG) 12.5 MG tablet Take 1 tablet (12.5 mg total) by mouth 2 (two) times daily with a meal.  . clopidogrel (PLAVIX) 75 MG tablet Take 1 tablet (75 mg total) by mouth daily.  Marland Kitchen ibuprofen (ADVIL,MOTRIN) 200 MG tablet Take 400-600 mg by mouth daily as needed for moderate pain.  . nitroGLYCERIN (NITROSTAT) 0.4  MG SL tablet Place 1 tablet (0.4 mg total) under the tongue every 5 (five) minutes as needed for chest pain.  . simvastatin (ZOCOR) 40 MG tablet Take 1 tablet (40 mg total) by mouth daily. (Patient taking differently: Take 40 mg by mouth every evening. )     Allergies  Allergen Reactions  . Levaquin [Levofloxacin] Itching and Rash    Social History   Socioeconomic History  . Marital status: Married    Spouse name: Not on file  . Number of children: Not on file  . Years of education: Not on file  . Highest education level: Not on file  Occupational History  . Not on file  Social Needs  . Financial resource strain: Not on file  . Food insecurity:    Worry: Not on file    Inability: Not on file  . Transportation needs:    Medical: Not on file    Non-medical: Not on file  Tobacco Use  . Smoking status: Never Smoker  . Smokeless tobacco: Never Used  Substance and Sexual Activity  . Alcohol use: Yes    Comment: Occasionally  . Drug use: No  . Sexual activity: Not on file  Lifestyle  . Physical activity:    Days per week: Not on file    Minutes per session: Not on file  . Stress: Not on file  Relationships  . Social connections:    Talks on phone: Not on file    Gets together: Not on file    Attends religious  service: Not on file    Active member of club or organization: Not on file    Attends meetings of clubs or organizations: Not on file    Relationship status: Not on file  . Intimate partner violence:    Fear of current or ex partner: Not on file    Emotionally abused: Not on file    Physically abused: Not on file    Forced sexual activity: Not on file  Other Topics Concern  . Not on file  Social History Narrative  . Not on file     Review of Systems: General: negative for chills, fever, night sweats or weight changes.  Cardiovascular: negative for chest pain, dyspnea on exertion, edema, orthopnea, palpitations, paroxysmal nocturnal dyspnea or shortness of  breath Dermatological: negative for rash Respiratory: negative for cough or wheezing Urologic: negative for hematuria Abdominal: negative for nausea, vomiting, diarrhea, bright red blood per rectum, melena, or hematemesis Neurologic: negative for visual changes, syncope, or dizziness All other systems reviewed and are otherwise negative except as noted above.    Blood pressure (!) 146/60, pulse 68, height 6' (1.829 m), weight 212 lb 6.4 oz (96.3 kg).  General appearance: alert and no distress Neck: no adenopathy, no carotid bruit, no JVD, supple, symmetrical, trachea midline and thyroid not enlarged, symmetric, no tenderness/mass/nodules Lungs: clear to auscultation bilaterally Heart: regular rate and rhythm, S1, S2 normal, no murmur, click, rub or gallop Extremities: extremities normal, atraumatic, no cyanosis or edema Pulses: 2+ and symmetric Skin: Skin color, texture, turgor normal. No rashes or lesions Neurologic: Alert and oriented X 3, normal strength and tone. Normal symmetric reflexes. Normal coordination and gait  EKG sinus rhythm at 68 with nonspecific ST and T wave changes.  Personally reviewed this EKG.  ASSESSMENT AND PLAN:   Coronary artery disease History of CAD status post coronary artery bypass grafting at the [facility] [place] by Dr. Julaine FusiPaul Corso back in 1999.  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 Lavonne ChickBill Gamble.  His last Myoview performed 10/08/2010 was nonischemic.  He denies chest pain or shortness of breath.  Carotid artery disease History of moderate bilateral ICA stenosis by duplex ultrasound recently checked 02/04/2018.  This will be checked on an annual basis.  Essential hypertension History of essential hypertension blood pressure measured today 146/60.  He is on carvedilol.  Continue current  meds at current dosing.  Hyperlipidemia History of hyperlipidemia on statin therapy with recent lipid profile performed by his PCP 02/09/2018 revealing total cholesterol 142, LDL 72 and HDL 50.      Runell GessJonathan J. Long Brimage MD FACP,FACC,FAHA, [facility] 03/02/2018 8:22 AM

## 2018-03-02 NOTE — Assessment & Plan Note (Signed)
History of essential hypertension blood pressure measured today 146/60.  He is on carvedilol.  Continue current meds at current dosing.

## 2018-03-02 NOTE — Patient Instructions (Signed)
Medication Instructions:  DECREASE ASPIRIN TO 81 MG ONCE DAILY If you need a refill on your cardiac medications before your next appointment, please call your pharmacy.   Lab work:  If you have labs (blood work) drawn today and your tests are completely normal, you will receive your results only by: Marland Kitchen. MyChart Message (if you have MyChart) OR . A paper copy in the mail If you have any lab test that is abnormal or we need to change your treatment, we will call you to review the results.  Testing/Procedures: Your physician has requested that you have a carotid duplex. This test is an ultrasound of the carotid arteries in your neck. It looks at blood flow through these arteries that supply the brain with blood. Allow one hour for this exam. There are no restrictions or special instructions. SCHEDULE IN 1 YEAR   Follow-Up: At [facility], you and your health needs are our priority.  As part of our continuing mission to provide you with exceptional heart care, we have created designated Provider Care Teams.  These Care Teams include your primary Cardiologist (physician) and Advanced Practice Providers (APPs -  Physician Assistants and Nurse Practitioners) who all work together to provide you with the care you need, when you need it. You will need a follow up appointment in 12 months.  Please call our office 2 months in advance to schedule this appointment.  You may see Dr. Allyson SabalBerry or one of the following Advanced Practice Providers on your designated Care Team:   Corine ShelterLuke Kilroy, PA-C Judy PimpleKrista Kroeger, [place] . Marjie Skiffallie Goodrich, PA-C

## 2018-03-02 NOTE — Assessment & Plan Note (Signed)
History of hyperlipidemia on statin therapy with recent lipid profile performed by his PCP 02/09/2018 revealing total cholesterol 142, LDL 72 and HDL 50.

## 2018-03-15 DIAGNOSIS — M67814 Other specified disorders of tendon, left shoulder: Secondary | ICD-10-CM | POA: Diagnosis not present

## 2018-03-15 DIAGNOSIS — M25512 Pain in left shoulder: Secondary | ICD-10-CM | POA: Diagnosis not present

## 2018-03-15 DIAGNOSIS — E6609 Other obesity due to excess calories: Secondary | ICD-10-CM | POA: Diagnosis not present

## 2018-03-15 DIAGNOSIS — Z6830 Body mass index (BMI) 30.0-30.9, adult: Secondary | ICD-10-CM | POA: Diagnosis not present

## 2018-03-23 ENCOUNTER — Other Ambulatory Visit (HOSPITAL_COMMUNITY): Payer: Self-pay | Admitting: Family Medicine

## 2018-03-23 DIAGNOSIS — M25512 Pain in left shoulder: Secondary | ICD-10-CM

## 2018-03-23 DIAGNOSIS — M67814 Other specified disorders of tendon, left shoulder: Secondary | ICD-10-CM

## 2018-03-25 DIAGNOSIS — M25511 Pain in right shoulder: Secondary | ICD-10-CM | POA: Diagnosis not present

## 2018-03-25 DIAGNOSIS — Z6830 Body mass index (BMI) 30.0-30.9, adult: Secondary | ICD-10-CM | POA: Diagnosis not present

## 2018-03-25 DIAGNOSIS — E6609 Other obesity due to excess calories: Secondary | ICD-10-CM | POA: Diagnosis not present

## 2018-03-25 DIAGNOSIS — M19012 Primary osteoarthritis, left shoulder: Secondary | ICD-10-CM | POA: Diagnosis not present

## 2018-03-25 DIAGNOSIS — M67814 Other specified disorders of tendon, left shoulder: Secondary | ICD-10-CM | POA: Diagnosis not present

## 2018-03-25 DIAGNOSIS — M25512 Pain in left shoulder: Secondary | ICD-10-CM | POA: Diagnosis not present

## 2018-06-14 ENCOUNTER — Other Ambulatory Visit: Payer: Self-pay

## 2018-06-14 ENCOUNTER — Telehealth: Payer: Self-pay | Admitting: Cardiovascular Disease

## 2018-06-14 ENCOUNTER — Other Ambulatory Visit: Payer: Self-pay | Admitting: Pharmacist Clinician (PhC)/ Clinical Pharmacy Specialist

## 2018-06-14 MED ORDER — CARVEDILOL 12.5 MG PO TABS: 12.5000 mg | ORAL_TABLET | Freq: Two times a day (BID) | ORAL | 3 refills | Status: DC

## 2018-06-14 NOTE — Telephone Encounter (Signed)
New Message          *STAT* If patient is at the pharmacy, call can be transferred to refill team.   1. Which medications need to be refilled? (please list name of each medication and dose if known) Carvedilol/Clopidogrel/Simvasation  2. Which pharmacy/location (including street and city if local pharmacy) is medication to be sent to?[facility]  on [address]  3. Do they need a 30 day or 90 day supply? 90

## 2018-06-15 ENCOUNTER — Other Ambulatory Visit: Payer: Self-pay

## 2018-06-15 MED ORDER — CLOPIDOGREL BISULFATE 75 MG PO TABS: 75.0000 mg | ORAL_TABLET | Freq: Every day | ORAL | 2 refills | Status: DC

## 2018-06-15 MED ORDER — SIMVASTATIN 40 MG PO TABS: 40.0000 mg | ORAL_TABLET | Freq: Every day | ORAL | 2 refills | Status: DC

## 2018-06-15 MED ORDER — CARVEDILOL 12.5 MG PO TABS: 12.5000 mg | ORAL_TABLET | Freq: Two times a day (BID) | ORAL | 2 refills | Status: DC

## 2018-06-15 NOTE — Telephone Encounter (Signed)
Rx(s) sent to pharmacy electronically.  

## 2018-12-27 DIAGNOSIS — I2510 Atherosclerotic heart disease of native coronary artery without angina pectoris: Secondary | ICD-10-CM | POA: Diagnosis not present

## 2018-12-27 DIAGNOSIS — Z79899 Other long term (current) drug therapy: Secondary | ICD-10-CM | POA: Diagnosis not present

## 2019-01-11 ENCOUNTER — Other Ambulatory Visit: Payer: Self-pay

## 2019-01-11 ENCOUNTER — Other Ambulatory Visit (HOSPITAL_COMMUNITY): Payer: Self-pay | Admitting: Internal Medicine

## 2019-01-11 ENCOUNTER — Ambulatory Visit (HOSPITAL_COMMUNITY)
Admission: RE | Admit: 2019-01-11 | Discharge: 2019-01-11 | Disposition: A | Discharge status: Home / Self Care | Payer: Medicare HMO | Source: Ambulatory Visit | Attending: Internal Medicine | Admitting: Internal Medicine

## 2019-01-11 DIAGNOSIS — R05 Cough: Secondary | ICD-10-CM

## 2019-01-11 DIAGNOSIS — R059 Cough, unspecified: Secondary | ICD-10-CM

## 2019-02-01 ENCOUNTER — Other Ambulatory Visit: Payer: Self-pay | Admitting: Cardiovascular Disease

## 2019-02-01 DIAGNOSIS — I6523 Occlusion and stenosis of bilateral carotid arteries: Secondary | ICD-10-CM

## 2019-02-07 ENCOUNTER — Encounter (HOSPITAL_COMMUNITY): Payer: Medicare Other

## 2019-02-24 ENCOUNTER — Other Ambulatory Visit: Payer: Self-pay

## 2019-02-24 ENCOUNTER — Ambulatory Visit (INDEPENDENT_AMBULATORY_CARE_PROVIDER_SITE_OTHER): Payer: Medicare HMO

## 2019-02-24 ENCOUNTER — Other Ambulatory Visit: Payer: Self-pay | Admitting: Cardiovascular Disease

## 2019-02-24 DIAGNOSIS — I6523 Occlusion and stenosis of bilateral carotid arteries: Secondary | ICD-10-CM | POA: Diagnosis not present

## 2019-02-25 ENCOUNTER — Encounter: Payer: Self-pay | Admitting: *Deleted

## 2019-02-25 ENCOUNTER — Other Ambulatory Visit: Payer: Self-pay | Admitting: *Deleted

## 2019-02-25 DIAGNOSIS — I6523 Occlusion and stenosis of bilateral carotid arteries: Secondary | ICD-10-CM

## 2019-02-28 ENCOUNTER — Other Ambulatory Visit: Payer: Self-pay

## 2019-02-28 ENCOUNTER — Encounter: Payer: Self-pay | Admitting: Cardiovascular Disease

## 2019-02-28 ENCOUNTER — Ambulatory Visit (INDEPENDENT_AMBULATORY_CARE_PROVIDER_SITE_OTHER): Payer: Medicare HMO | Admitting: Cardiovascular Disease

## 2019-02-28 VITALS — BP 148/72 | HR 76 | Ht 72.0 in | Wt 219.0 lb

## 2019-02-28 DIAGNOSIS — I2510 Atherosclerotic heart disease of native coronary artery without angina pectoris: Secondary | ICD-10-CM

## 2019-02-28 DIAGNOSIS — I10 Essential (primary) hypertension: Secondary | ICD-10-CM | POA: Diagnosis not present

## 2019-02-28 DIAGNOSIS — I6523 Occlusion and stenosis of bilateral carotid arteries: Secondary | ICD-10-CM

## 2019-02-28 DIAGNOSIS — E782 Mixed hyperlipidemia: Secondary | ICD-10-CM

## 2019-02-28 NOTE — Assessment & Plan Note (Signed)
History of moderate bilateral ICA stenosis which has remained stable by duplex ultrasound most recently performed 02/24/2019.  We will continue to follow him on an annual basis by duplex ultrasound

## 2019-02-28 NOTE — Assessment & Plan Note (Signed)
History of essential hypertension with blood pressure measured today at 148/72.  He is on carvedilol.

## 2019-02-28 NOTE — Assessment & Plan Note (Signed)
History of hyperlipidemia on simvastatin with lipid profile performed 12/24/2018 revealing total cholesterol of 153, LDL of 83 and HDL 49.

## 2019-02-28 NOTE — Assessment & Plan Note (Signed)
History of CAD status post CABG by Dr. Nolon Lennert at [facility] in [place] back in 1999.  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  He underwent AngioJet mechanical aspiration thrombectomy, angioplasty and stenting of the graft and has done well since.  His last Myoview performed 10/08/2010 was remarkable for very subtle inferior ischemia involving the inferoseptum towards the base and some inferior scar with mild peri-infarct ischemia.  He gets some stable angina with moderate exertion which has remained unchanged since I saw him.

## 2019-02-28 NOTE — Patient Instructions (Signed)
Medication Instructions:  Your physician recommends that you continue on your current medications as directed. Please refer to the Current Medication list given to you today.  If you need a refill on your cardiac medications before your next appointment, please call your pharmacy.   Lab work: NONE  Testing/Procedures: Your physician has requested that you have a carotid duplex in ONE YEAR. This test is an ultrasound of the carotid arteries in your neck. It looks at blood flow through these arteries that supply the brain with blood. Allow one hour for this exam. There are no restrictions or special instructions.   Follow-Up: At [facility], you and your health needs are our priority.  As part of our continuing mission to provide you with exceptional heart care, we have created designated Provider Care Teams.  These Care Teams include your primary Cardiologist (physician) and Advanced Practice Providers (APPs -  Physician Assistants and Nurse Practitioners) who all work together to provide you with the care you need, when you need it. You may see Dr Gwenlyn Found or one of the following Advanced Practice Providers on your designated Care Team:    Kerin Ransom, PA-C  [place], [place]  Coletta Memos, [place]  Your physician wants you to follow-up in: [place]. You will receive a reminder letter in the mail two months in advance. If you don't receive a letter, please call our office to schedule the follow-up appointment.

## 2019-02-28 NOTE — Progress Notes (Signed)
02/28/2019 Jason Johnson   [birth year]  [id number]  Primary Physician Elfredia Nevins, MD Primary Cardiologist: Runell Gess MD Nicholes Calamity, [place]  HPI:  Jason Johnson is a 78 y.o.  mildly overweight, married Caucasian male, father to 2, (father to 3 stepchildren,) grandfather to 5 great-grandchildren who I last saw in the office  03/02/2018. He has a history of CAD status post coronary artery bypass grafting by Dr. Julaine Fusi at the [facility] in [place], [place] back in 1999. His risk factors include hypertension and hyperlipidemia. 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 He underwent AngioJet mechanical aspiration thrombectomy, angioplasty and stenting of that graft and has done well since. He denies chest pain or shortness of breath. He had a Myoview performed in our office October 08, 2010, that was remarkable for a very subtle area of inferior ischemia involving inferior septum towards the base with some inferior scar with some mild peri-infarct ischemia.  Since I saw him a year ago he remained stable.  He does get out and do moderate activity.  He has mild stable angina which has not changed in severity.  His PCP checks his lipid profile which most recently performed 12/24/2018 revealed a total cholesterol of 53, LDL of 83 and HDL 49.  He also has moderate bilateral ICA stenosis which we followed by duplex ultrasound on annual basis.    Current Meds  Medication Sig  . aspirin EC 81 MG tablet Take 1 tablet (81 mg total) by mouth daily.  . carvedilol (COREG) 12.5 MG tablet Take 1 tablet (12.5 mg total) by mouth 2 (two) times daily with a meal.  . clopidogrel (PLAVIX) 75 MG tablet Take 1 tablet (75 mg total) by mouth daily.  Marland Kitchen ibuprofen (ADVIL,MOTRIN) 200 MG tablet Take 400-600 mg by mouth daily as needed for moderate pain.  . nitroGLYCERIN (NITROSTAT) 0.4 MG SL tablet Place 1  tablet (0.4 mg total) under the tongue every 5 (five) minutes as needed for chest pain.  . simvastatin (ZOCOR) 40 MG tablet Take 1 tablet (40 mg total) by mouth daily at 6 PM.     Allergies  Allergen Reactions  . Levaquin [Levofloxacin] Itching and Rash    Social History   Socioeconomic History  . Marital status: Married    Spouse name: Not on file  . Number of children: Not on file  . Years of education: Not on file  . Highest education level: Not on file  Occupational History  . Not on file  Social Needs  . Financial resource strain: Not on file  . Food insecurity    Worry: Not on file    Inability: Not on file  . Transportation needs    Medical: Not on file    Non-medical: Not on file  Tobacco Use  . Smoking status: Never Smoker  . Smokeless tobacco: Never Used  Substance and Sexual Activity  . Alcohol use: Yes    Comment: Occasionally  . Drug use: No  . Sexual activity: Not on file  Lifestyle  . Physical activity    Days per week: Not on file    Minutes per session: Not on file  . Stress: Not on file  Relationships  . Social Musician on phone: Not on file    Gets together: Not on file    Attends religious service: Not on file  Active member of club or organization: Not on file    Attends meetings of clubs or organizations: Not on file    Relationship status: Not on file  . Intimate partner violence    Fear of current or ex partner: Not on file    Emotionally abused: Not on file    Physically abused: Not on file    Forced sexual activity: Not on file  Other Topics Concern  . Not on file  Social History Narrative  . Not on file     Review of Systems: General: negative for chills, fever, night sweats or weight changes.  Cardiovascular: negative for chest pain, dyspnea on exertion, edema, orthopnea, palpitations, paroxysmal nocturnal dyspnea or shortness of breath Dermatological: negative for rash Respiratory: negative for cough or  wheezing Urologic: negative for hematuria Abdominal: negative for nausea, vomiting, diarrhea, bright red blood per rectum, melena, or hematemesis Neurologic: negative for visual changes, syncope, or dizziness All other systems reviewed and are otherwise negative except as noted above.    Blood pressure (!) 148/72, pulse 76, height 6' (1.829 m), weight 219 lb (99.3 kg), SpO2 93 %.  General appearance: alert and no distress Neck: no adenopathy, no JVD, supple, symmetrical, trachea midline, thyroid not enlarged, symmetric, no tenderness/mass/nodules and Soft right carotid bruit Lungs: clear to auscultation bilaterally Heart: regular rate and rhythm, S1, S2 normal, no murmur, click, rub or gallop Extremities: extremities normal, atraumatic, no cyanosis or edema Pulses: 2+ and symmetric Skin: Skin color, texture, turgor normal. No rashes or lesions Neurologic: Alert and oriented X 3, normal strength and tone. Normal symmetric reflexes. Normal coordination and gait  EKG normal sinus rhythm at 76 without ST or T wave changes.  I personally reviewed this EKG.  ASSESSMENT AND PLAN:   Coronary artery disease History of CAD status post CABG by Dr. Nolon Lennert at [facility] in [place] back in 1999.  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  He underwent AngioJet mechanical aspiration thrombectomy, angioplasty and stenting of the graft and has done well since.  His last Myoview performed 10/08/2010 was remarkable for very subtle inferior ischemia involving the inferoseptum towards the base and some inferior scar with mild peri-infarct ischemia.  He gets some stable angina with moderate exertion which has remained unchanged since I saw him.  Carotid artery disease History of moderate bilateral ICA stenosis which has remained stable by duplex ultrasound most recently performed 02/24/2019.  We will continue to follow him  on an annual basis by duplex ultrasound  Essential hypertension History of essential hypertension with blood pressure measured today at 148/72.  He is on carvedilol.  Hyperlipidemia History of hyperlipidemia on simvastatin with lipid profile performed 12/24/2018 revealing total cholesterol of 153, LDL of 83 and HDL 49.      Lorretta Harp MD FACP,FACC,FAHA, [facility] 02/28/2019 8:35 AM

## 2019-03-22 ENCOUNTER — Other Ambulatory Visit: Payer: Self-pay | Admitting: Cardiovascular Disease

## 2019-04-07 ENCOUNTER — Other Ambulatory Visit: Payer: Self-pay | Admitting: Cardiovascular Disease

## 2019-04-07 NOTE — Telephone Encounter (Signed)
Rx request sent to pharmacy.  

## 2019-05-04 ENCOUNTER — Other Ambulatory Visit: Payer: Self-pay

## 2019-05-04 ENCOUNTER — Ambulatory Visit (INDEPENDENT_AMBULATORY_CARE_PROVIDER_SITE_OTHER): Payer: Medicare HMO | Admitting: Cardiovascular Disease

## 2019-05-04 ENCOUNTER — Telehealth: Payer: Self-pay | Admitting: Radiology

## 2019-05-04 ENCOUNTER — Encounter: Payer: Self-pay | Admitting: Cardiovascular Disease

## 2019-05-04 DIAGNOSIS — E782 Mixed hyperlipidemia: Secondary | ICD-10-CM

## 2019-05-04 DIAGNOSIS — I499 Cardiac arrhythmia, unspecified: Secondary | ICD-10-CM

## 2019-05-04 NOTE — Assessment & Plan Note (Signed)
History of hyperlipidemia on simvastatin 40 mg a day with lipid profile performed 12/27/2018 revealed a total cholesterol of 153, LDL of 83 and HDL 49.  He is not at goal for secondary prevention.  I am going to get a repeat fasting lipid profile and will make adjustments based on those results.

## 2019-05-04 NOTE — Assessment & Plan Note (Signed)
Ms. Licciardi was referred back to me by Dr. Sherwood Gambler and his wife who is a retired Charity fundraiser because of an irregular heartbeat.  Their concern was that he had atrial fibrillation.  He does not feel any different clinically since I saw him last 2 months ago.  His EKG today shows sinus rhythm with sinus arrhythmia.  I am going to get a 2-week Zio patch to further evaluate.

## 2019-05-04 NOTE — Progress Notes (Signed)
05/04/2019 Jason Johnson   01/25/1941  [id number]  Primary Physician Jason Nevins, MD Primary Cardiologist: Jason Gess MD Jason Johnson, [place]  HPI:  Jason Johnson is a 79 y.o.  mildly overweight, married Caucasian male, father to 2, (father to 3 stepchildren,) grandfather to 5 great-grandchildren who I last saw in the office  02/28/2019. He has a history of CAD status post coronary artery bypass grafting by Dr. Julaine Johnson at the [facility] in [place], [place] back in 1999. His risk factors include hypertension and hyperlipidemia. 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 He underwent AngioJet mechanical aspiration thrombectomy, angioplasty and stenting of that graft and has done well since. He denies chest pain or shortness of breath. He had a Myoview performed in our office October 08, 2010, that was remarkable for a very subtle area of inferior ischemia involving inferior septum towards the base with some inferior scar with some mild peri-infarct ischemia.    He does get out and do moderate activity.  He has mild stable angina which has not changed in severity.  His PCP checks his lipid profile which most recently performed 12/24/2018 revealed a total cholesterol of 53, LDL of 83 and HDL 49.  He also has moderate bilateral ICA stenosis which we followed by duplex ultrasound on annual basis.  Since I saw him 2 months ago his wife has noticed an irregular heartbeat when she checks his radial pulse.  He was referred back for question atrial fibrillation.  He does not feel any difference clinically since I saw him 2 months ago.  His EKG today shows sinus rhythm with sinus arrhythmia.   Current Meds  Medication Sig  . aspirin EC 81 MG tablet Take 1 tablet (81 mg total) by mouth daily.  . carvedilol (COREG) 12.5 MG tablet Take 1 tablet (12.5 mg total) by mouth 2 (two) times daily with a  meal.  . clopidogrel (PLAVIX) 75 MG tablet TAKE 1 TABLET BY MOUTH EVERY DAY  . ibuprofen (ADVIL,MOTRIN) 200 MG tablet Take 400-600 mg by mouth daily as needed for moderate pain.  . nitroGLYCERIN (NITROSTAT) 0.4 MG SL tablet Place 1 tablet (0.4 mg total) under the tongue every 5 (five) minutes as needed for chest pain.  . simvastatin (ZOCOR) 40 MG tablet TAKE 1 TABLET (40 MG TOTAL) BY MOUTH DAILY AT 6 PM.     Allergies  Allergen Reactions  . Levaquin [Levofloxacin] Itching and Rash    Social History   Socioeconomic History  . Marital status: Married    Spouse name: Not on file  . Number of children: Not on file  . Years of education: Not on file  . Highest education level: Not on file  Occupational History  . Not on file  Tobacco Use  . Smoking status: Never Smoker  . Smokeless tobacco: Never Used  Substance and Sexual Activity  . Alcohol use: Yes    Comment: Occasionally  . Drug use: No  . Sexual activity: Not on file  Other Topics Concern  . Not on file  Social History Narrative  . Not on file   Social Determinants of Health   Financial Resource Strain:   . Difficulty of Paying Living Expenses: Not on file  Food Insecurity:   . Worried About Programme researcher, broadcasting/film/video in the Last Year: Not on file  . Ran Out of Food in the Last  Year: Not on file  Transportation Needs:   . Lack of Transportation (Medical): Not on file  . Lack of Transportation (Non-Medical): Not on file  Physical Activity:   . Days of Exercise per Week: Not on file  . Minutes of Exercise per Session: Not on file  Stress:   . Feeling of Stress : Not on file  Social Connections:   . Frequency of Communication with Friends and Family: Not on file  . Frequency of Social Gatherings with Friends and Family: Not on file  . Attends Religious Services: Not on file  . Active Member of Clubs or Organizations: Not on file  . Attends Archivist Meetings: Not on file  . Marital Status: Not on file   Intimate Partner Violence:   . Fear of Current or Ex-Partner: Not on file  . Emotionally Abused: Not on file  . Physically Abused: Not on file  . Sexually Abused: Not on file     Review of Systems: General: negative for chills, fever, night sweats or weight changes.  Cardiovascular: negative for chest pain, dyspnea on exertion, edema, orthopnea, palpitations, paroxysmal nocturnal dyspnea or shortness of breath Dermatological: negative for rash Respiratory: negative for cough or wheezing Urologic: negative for hematuria Abdominal: negative for nausea, vomiting, diarrhea, bright red blood per rectum, melena, or hematemesis Neurologic: negative for visual changes, syncope, or dizziness All other systems reviewed and are otherwise negative except as noted above.    Blood pressure 140/68, pulse 70, temperature (!) 97.1 F (36.2 C), height 6' (1.829 m), weight 220 lb (99.8 kg).  General appearance: alert and no distress Neck: no adenopathy, no carotid bruit, no JVD, supple, symmetrical, trachea midline and thyroid not enlarged, symmetric, no tenderness/mass/nodules Lungs: clear to auscultation bilaterally Heart: regular rate and rhythm Extremities: extremities normal, atraumatic, no cyanosis or edema Pulses: 2+ and symmetric Skin: Skin color, texture, turgor normal. No rashes or lesions Neurologic: Alert and oriented X 3, normal strength and tone. Normal symmetric reflexes. Normal coordination and gait  EKG sinus rhythm at 70 with sinus arrhythmia and nonspecific T wave changes.  I personally reviewed this EKG.  ASSESSMENT AND PLAN:   Irregular heartbeat Jason Johnson was referred back to me by Dr. Gerarda Johnson and his wife who is a retired Therapist, sports because of an irregular heartbeat.  Their concern was that he had atrial fibrillation.  He does not feel any different clinically since I saw him last 2 months ago.  His EKG today shows sinus rhythm with sinus arrhythmia.  I am going to get a 2-week Zio  patch to further evaluate.  Hyperlipidemia History of hyperlipidemia on simvastatin 40 mg a day with lipid profile performed 12/27/2018 revealed a total cholesterol of 153, LDL of 83 and HDL 49.  He is not at goal for secondary prevention.  I am going to get a repeat fasting lipid profile and will make adjustments based on those results.      Lorretta Harp MD FACP,FACC,FAHA, [facility] 05/04/2019 9:15 AM

## 2019-05-04 NOTE — Patient Instructions (Signed)
Medication Instructions:  Your physician recommends that you continue on your current medications as directed. Please refer to the Current Medication list given to you today.  If you need a refill on your cardiac medications before your next appointment, please call your pharmacy.   Lab work: Fasting Lipids and Hepatic Function If you have labs (blood work) drawn today and your tests are completely normal, you will receive your results only by: MyChart Message (if you have MyChart) OR A paper copy in the mail If you have any lab test that is abnormal or we need to change your treatment, we will call you to review the results.  Testing/Procedures: 2 week Zio Patch  Follow-Up: At [facility], you and your health needs are our priority.  As part of our continuing mission to provide you with exceptional heart care, we have created designated Provider Care Teams.  These Care Teams include your primary Cardiologist (physician) and Advanced Practice Providers (APPs -  Physician Assistants and Nurse Practitioners) who all work together to provide you with the care you need, when you need it. You may see Dr. Allyson Sabal or one of the following Advanced Practice Providers on your designated Care Team:    Corine Shelter, PA-C  [place], [place]  Edd Fabian, [place]  Your physician wants you to follow-up in: 1 year with Dr. Allyson Sabal. You will receive a reminder letter in the mail two months in advance. If you don't receive a letter, please call our office to schedule the follow-up appointment.   Any Other Special Instructions Will Be Listed Below (If Applicable).  ZIO XT- Long Term Monitor Instructions   Your physician has requested you wear your ZIO patch monitor 14 days.   This is a single patch monitor.  [employer] supplies one patch monitor per enrollment.  Additional stickers are not available.   Please do not apply patch if you will be having a Nuclear Stress Test, Echocardiogram, Cardiac CT,  MRI, or Chest Xray during the time frame you would be wearing the monitor. The patch cannot be worn during these tests.  You cannot remove and re-apply the ZIO XT patch monitor.   Your ZIO patch monitor will be sent USPS Priority mail from [facility] directly to your home address. The monitor may also be mailed to a PO BOX if home delivery is not available.   It may take 3-5 days to receive your monitor after you have been enrolled.   Once you have received you monitor, please review enclosed instructions.  Your monitor has already been registered assigning a specific monitor serial # to you.   Applying the monitor   Shave hair from upper left chest.   Hold abrader disc by orange tab.  Rub abrader in 40 strokes over left upper chest as indicated in your monitor instructions.   Clean area with 4 enclosed alcohol pads .  Use all pads to assure are is cleaned thoroughly.  Let dry.   Apply patch as indicated in monitor instructions.  Patch will be place under collarbone on left side of chest with arrow pointing upward.   Rub patch adhesive wings for 2 minutes.Remove white label marked "1".  Remove white label marked "2".  Rub patch adhesive wings for 2 additional minutes.   While looking in a mirror, press and release button in center of patch.  A small green light will flash 3-4 times .  This will be your only indicator the monitor has been turned on.  Do not shower for the first 24 hours.  You may shower after the first 24 hours.   Press button if you feel a symptom. You will hear a small click.  Record Date, Time and Symptom in the Patient Log Book.   When you are ready to remove patch, follow instructions on last 2 pages of Patient Log Book.  Stick patch monitor onto last page of Patient Log Book.   Place Patient Log Book in [place] box.  Use locking tab on box and tape box closed securely.  The [employer] and [employer] has [employer] on it.  Please place in mailbox as soon as  possible.  Your physician should have your test results approximately 7 days after the monitor has been mailed back to [facility].   Call [place] at [phone] if you have questions regarding your ZIO XT patch monitor.  Call them immediately if you see an orange light blinking on your monitor.   If your monitor falls off in less than 4 days contact our Monitor department at [phone].  If your monitor becomes loose or falls off after 4 days call [employer] at [phone] for suggestions on securing your monitor.

## 2019-05-04 NOTE — Telephone Encounter (Signed)
Enrolled patient for a 14 day Zio monitor to be mailed to patients home.  

## 2019-05-06 ENCOUNTER — Telehealth: Payer: Self-pay

## 2019-05-06 DIAGNOSIS — E782 Mixed hyperlipidemia: Secondary | ICD-10-CM

## 2019-05-06 LAB — LIPID PANEL
Chol/HDL Ratio: 3.1 ratio (ref 0.0–5.0)
Cholesterol, Total: 160 mg/dL (ref 100–199)
HDL: 51 mg/dL (ref >39)
LDL Chol Calc (NIH): 87 mg/dL (ref 0–99)
Triglycerides: 122 mg/dL (ref 0–149)
VLDL Cholesterol Cal: 22 mg/dL (ref 5–40)

## 2019-05-06 LAB — HEPATIC FUNCTION PANEL
ALT: 19 IU/L (ref 0–44)
AST: 26 IU/L (ref 0–40)
Albumin: 4.1 g/dL (ref 3.7–4.7)
Alkaline Phosphatase: 64 IU/L (ref 39–117)
Bilirubin Total: 0.9 mg/dL (ref 0.0–1.2)
Bilirubin, Direct: 0.26 mg/dL (ref 0.00–0.40)
Total Protein: 6.6 g/dL (ref 6.0–8.5)

## 2019-05-06 MED ORDER — EZETIMIBE 10 MG PO TABS: 10.0000 mg | ORAL_TABLET | Freq: Every day | ORAL | 3 refills | Status: DC

## 2019-05-06 NOTE — Telephone Encounter (Signed)
-----   Message from Runell Gess, MD sent at 05/06/2019 11:07 AM EST ----- Lipid profile not at goal for secondary prevention.  LDL was 87 on simvastatin 40.  Add Zetia 10 mg and recheck in 2 months.

## 2019-05-06 NOTE — Telephone Encounter (Signed)
Advised pt of labs. Verbalized understanding. Put in new orders.  

## 2019-05-08 ENCOUNTER — Other Ambulatory Visit (INDEPENDENT_AMBULATORY_CARE_PROVIDER_SITE_OTHER): Payer: Medicare HMO

## 2019-05-08 DIAGNOSIS — I499 Cardiac arrhythmia, unspecified: Secondary | ICD-10-CM | POA: Diagnosis not present

## 2019-06-18 ENCOUNTER — Other Ambulatory Visit: Payer: Self-pay | Admitting: Cardiovascular Disease

## 2019-12-14 ENCOUNTER — Other Ambulatory Visit: Payer: Self-pay | Admitting: Cardiovascular Disease

## 2020-02-28 ENCOUNTER — Ambulatory Visit (INDEPENDENT_AMBULATORY_CARE_PROVIDER_SITE_OTHER): Payer: Medicare HMO

## 2020-02-28 ENCOUNTER — Other Ambulatory Visit: Payer: Self-pay | Admitting: Cardiovascular Disease

## 2020-02-28 DIAGNOSIS — I6523 Occlusion and stenosis of bilateral carotid arteries: Secondary | ICD-10-CM

## 2020-03-12 ENCOUNTER — Other Ambulatory Visit: Payer: Self-pay | Admitting: Cardiovascular Disease

## 2020-05-04 ENCOUNTER — Encounter: Payer: Self-pay | Admitting: Cardiovascular Disease

## 2020-05-04 ENCOUNTER — Ambulatory Visit: Payer: Medicare HMO | Admitting: Cardiovascular Disease

## 2020-05-04 ENCOUNTER — Other Ambulatory Visit: Payer: Self-pay

## 2020-05-04 DIAGNOSIS — I499 Cardiac arrhythmia, unspecified: Secondary | ICD-10-CM | POA: Diagnosis not present

## 2020-05-04 DIAGNOSIS — I2510 Atherosclerotic heart disease of native coronary artery without angina pectoris: Secondary | ICD-10-CM

## 2020-05-04 DIAGNOSIS — E782 Mixed hyperlipidemia: Secondary | ICD-10-CM | POA: Diagnosis not present

## 2020-05-04 DIAGNOSIS — I10 Essential (primary) hypertension: Secondary | ICD-10-CM

## 2020-05-04 MED ORDER — ATORVASTATIN CALCIUM 40 MG PO TABS: 40.0000 mg | ORAL_TABLET | Freq: Every day | ORAL | 3 refills | Status: DC

## 2020-05-04 NOTE — Assessment & Plan Note (Signed)
History of moderate bilateral ICA stenosis by duplex ultrasound recently performed 02/28/20.  This will be repeated on annual basis.

## 2020-05-04 NOTE — Patient Instructions (Signed)
Medication Instructions:  Start taking Atorvastatin (lipitor) 40mg  once daily.  Stop taking Simvastatin.   *If you need a refill on your cardiac medications before your next appointment, please call your pharmacy*   Lab Work: Your physician recommends that you return for lab work in: 3 months for lipid/liver profile.  If you have labs (blood work) drawn today and your tests are completely normal, you will receive your results only by: MyChart Message (if you have MyChart) OR . A paper copy in the mail If you have any lab test that is abnormal or we need to change your treatment, we will call you to review the results.   Testing/Procedures: Your physician has requested that you have a carotid duplex. This test is an ultrasound of the carotid arteries in your neck. It looks at blood flow through these arteries that supply the brain with blood. Allow one hour for this exam. There are no restrictions or special instructions. We do this procedure at [address] [facility]. 2nd Floor.  To be done in Nov. 2022.   Follow-Up: At [facility], you and your health needs are our priority.  As part of our continuing mission to provide you with exceptional heart care, we have created designated Provider Care Teams.  These Care Teams include your primary Cardiologist (physician) and Advanced Practice Providers (APPs -  Physician Assistants and Nurse Practitioners) who all work together to provide you with the care you need, when you need it.  We recommend signing up for the patient portal called "MyChart".  Sign up information is provided on this After Visit Summary.  MyChart is used to connect with patients for Virtual Visits (Telemedicine).  Patients are able to view lab/test results, encounter notes, upcoming appointments, etc.  Non-urgent messages can be sent to your provider as well.   To learn more about what you can do with MyChart, go to [facility].    Your next appointment:   12  month(s)  The format for your next appointment:   In Person  Provider:   [email or web address], MD

## 2020-05-04 NOTE — Assessment & Plan Note (Signed)
History of CAD status post CABG by Dr. Julaine Fusi at [facility] in [place] back in 1999.  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  He underwent AngioJet mechanical thrombectomy, angioplasty and stenting and has done well since.  He had a Myoview performed in our office 10/08/10 that showed a subtle area of inferior ischemia involving the inferior septum towards the base with otherwise inferior scar with some mild peri-infarct ischemia.  He is asymptomatic.

## 2020-05-04 NOTE — Assessment & Plan Note (Signed)
History of hyperlipidemia on simvastatin 40 mg a day with recent lipid profile performed 03/13/20 revealing a total cholesterol of 147, LDL of 87 and HDL of 52, not at goal for secondary prevention.  Am going to switch him to atorvastatin 40 mg a day and we will recheck a lipid liver profile in 3 months.

## 2020-05-04 NOTE — Progress Notes (Signed)
05/04/2020 Jason Johnson   [birth year]  [id number]  Primary Physician Jason Nevins, MD Primary Cardiologist: Runell Gess MD Nicholes Calamity, [place]  HPI:  Jason Johnson is a 80 y.o.    mildly overweight, married Caucasian male, father to 2, (father to 3 stepchildren,) grandfather to 5 great-grandchildren who I last saw in the office1/27/21. He has a history of CAD status post coronary artery bypass grafting by Dr. Julaine Fusi at the [facility] in [place], [place] back in 1999. His risk factors include hypertension and hyperlipidemia. 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 He underwent AngioJet mechanical aspiration thrombectomy, angioplasty and stenting of that graft and has done well since. He denies chest pain or shortness of breath. He had a Myoview performed in our office October 08, 2010, that was remarkable for a very subtle area of inferior ischemia involving inferior septum towards the base with some inferior scar with some mild peri-infarct ischemia.   He does get out and do moderate activity. He has mild stable angina which has not changed in severity. His PCP checks his lipid profile which most recently performed 12/24/2018 revealed a total cholesterol of 53, LDL of 83 and HDL 49. He also has moderate bilateral ICA stenosis which we followed by duplex ultrasound on annual basis.  Since I saw him a year ago he has done well.  He denies chest pain or shortness of breath.  His most recent fasting lipid profile performed 03/13/20 revealed LDL that was still above goal for secondary prevention at 87.  It was 83 a year ago.  He was complaining of some an irregular heart rhythm when I saw him last and an event monitor showed occasional PACs, PVCs and short runs of SVT and NSVT which required no intervention.   Current Meds  Medication Sig  . aspirin EC 81 MG tablet Take 1 tablet (81  mg total) by mouth daily.  . carvedilol (COREG) 12.5 MG tablet TAKE 1 TABLET (12.5 MG TOTAL) BY MOUTH 2 (TWO) TIMES DAILY WITH A MEAL.  Marland Kitchen clopidogrel (PLAVIX) 75 MG tablet TAKE 1 TABLET BY MOUTH EVERY DAY  . ibuprofen (ADVIL,MOTRIN) 200 MG tablet Take 400-600 mg by mouth daily as needed for moderate pain.  . nitroGLYCERIN (NITROSTAT) 0.4 MG SL tablet Place 1 tablet (0.4 mg total) under the tongue every 5 (five) minutes as needed for chest pain.  . simvastatin (ZOCOR) 40 MG tablet TAKE 1 TABLET (40 MG TOTAL) BY MOUTH DAILY AT 6 PM.  . [DISCONTINUED] ezetimibe (ZETIA) 10 MG tablet Take 1 tablet (10 mg total) by mouth daily.     Allergies  Allergen Reactions  . Levaquin [Levofloxacin] Itching and Rash    Social History   Socioeconomic History  . Marital status: Married    Spouse name: Not on file  . Number of children: Not on file  . Years of education: Not on file  . Highest education level: Not on file  Occupational History  . Not on file  Tobacco Use  . Smoking status: Never Smoker  . Smokeless tobacco: Never Used  Vaping Use  . Vaping Use: Never used  Substance and Sexual Activity  . Alcohol use: Yes    Comment: Occasionally  . Drug use: No  . Sexual activity: Not on file  Other Topics Concern  . Not on file  Social History Narrative  . Not on file  Social Determinants of Health   Financial Resource Strain: Not on file  Food Insecurity: Not on file  Transportation Needs: Not on file  Physical Activity: Not on file  Stress: Not on file  Social Connections: Not on file  Intimate Partner Violence: Not on file     Review of Systems: General: negative for chills, fever, night sweats or weight changes.  Cardiovascular: negative for chest pain, dyspnea on exertion, edema, orthopnea, palpitations, paroxysmal nocturnal dyspnea or shortness of breath Dermatological: negative for rash Respiratory: negative for cough or wheezing Urologic: negative for  hematuria Abdominal: negative for nausea, vomiting, diarrhea, bright red blood per rectum, melena, or hematemesis Neurologic: negative for visual changes, syncope, or dizziness All other systems reviewed and are otherwise negative except as noted above.    Blood pressure 138/72, pulse 66, height 6' (1.829 m), weight 216 lb (98 kg).  General appearance: alert and no distress Neck: no adenopathy, no carotid bruit, no JVD, supple, symmetrical, trachea midline and thyroid not enlarged, symmetric, no tenderness/mass/nodules Lungs: clear to auscultation bilaterally Heart: regular rate and rhythm, S1, S2 normal, no murmur, click, rub or gallop Extremities: extremities normal, atraumatic, no cyanosis or edema Pulses: 2+ and symmetric Skin: Skin color, texture, turgor normal. No rashes or lesions Neurologic: Alert and oriented X 3, normal strength and tone. Normal symmetric reflexes. Normal coordination and gait  EKG sinus rhythm at 66 with marked sinus arrhythmia and nonspecific ST and T wave changes.  I personally reviewed this EKG.  ASSESSMENT AND PLAN:   Coronary artery disease History of CAD status post CABG by Dr. Julaine Fusi at [facility] in [place] back in 1999.  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  He underwent AngioJet mechanical thrombectomy, angioplasty and stenting and has done well since.  He had a Myoview performed in our office 10/08/10 that showed a subtle area of inferior ischemia involving the inferior septum towards the base with otherwise inferior scar with some mild peri-infarct ischemia.  He is asymptomatic.  Carotid artery disease History of moderate bilateral ICA stenosis by duplex ultrasound recently performed 02/28/20.  This will be repeated on annual basis.  Essential hypertension History of essential hypertension blood pressure measured today 138/72.  He is on  carvedilol.  Hyperlipidemia History of hyperlipidemia on simvastatin 40 mg a day with recent lipid profile performed 03/13/20 revealing a total cholesterol of 147, LDL of 87 and HDL of 52, not at goal for secondary prevention.  Am going to switch him to atorvastatin 40 mg a day and we will recheck a lipid liver profile in 3 months.  Irregular heartbeat History of arrhythmia which is asymptomatic and demonstrated on his blood pressure cuff.  We did get an event monitor 05/08/19 that revealed occasional PVCs, PACs and short runs of SVT and NSVT requiring no pharmacologic intervention.      Runell Gess MD FACP,FACC,FAHA, [facility] 05/04/2020 9:52 AM

## 2020-05-04 NOTE — Assessment & Plan Note (Signed)
History of arrhythmia which is asymptomatic and demonstrated on his blood pressure cuff.  We did get an event monitor 05/08/19 that revealed occasional PVCs, PACs and short runs of SVT and NSVT requiring no pharmacologic intervention.

## 2020-05-04 NOTE — Assessment & Plan Note (Signed)
History of essential hypertension blood pressure measured today 138/72.  He is on carvedilol.

## 2020-07-18 ENCOUNTER — Telehealth: Payer: Self-pay | Admitting: Cardiovascular Disease

## 2020-07-18 DIAGNOSIS — E782 Mixed hyperlipidemia: Secondary | ICD-10-CM

## 2020-07-18 MED ORDER — CLOPIDOGREL BISULFATE 75 MG PO TABS
1.0000 | ORAL_TABLET | Freq: Every day | ORAL | 2 refills | Status: DC
Start: 2020-07-18 — End: 2021-04-15

## 2020-07-18 NOTE — Telephone Encounter (Signed)
Returned call to patient-advised rx sent to pharmacy for Plavix.    He is also requesting to return to taking simvastatin 40 as previously.  He did not realize at his appt that atorvastatin was Lipitor.  He states he took Lipitor 10-12 years ago and did not tolerate this well, made him a "wreck".    He request not to take this anymore.     OV: 01/28 with Dr. Allyson Sabal.  Changed from simvastatin to atorvastatin.    Advised would route to MD to review for change.

## 2020-07-18 NOTE — Telephone Encounter (Signed)
*  STAT* If patient is at the pharmacy, call can be transferred to refill team.   1. Which medications need to be refilled? (please list name of each medication and dose if known)  clopidogrel (PLAVIX) 75 MG tablet  2. Which pharmacy/location (including street and city if local pharmacy) is medication to be sent to? [id number] - [place], [place] - [address]  3. Do they need a 30 day or 90 day supply?  90  PT is all out his medication.Please advise     Pt c/o medication issue:  1. Name of Medication:  atorvastatin (LIPITOR) 40 MG tablet 2. How are you currently taking this medication (dosage and times per day)? Not taking at all   3. Are you having a reaction (difficulty breathing--STAT)? No   4. What is your medication issue?  PT is requesting the medication be rewritten to simvastatin 40 mg dur to he has taking the medication before and he cannot tolerate it.please advise

## 2020-07-26 MED ORDER — EZETIMIBE 10 MG PO TABS: 10.0000 mg | ORAL_TABLET | Freq: Every day | ORAL | 3 refills | Status: DC

## 2020-07-26 MED ORDER — SIMVASTATIN 40 MG PO TABS: 40.0000 mg | ORAL_TABLET | Freq: Every day | ORAL | 3 refills | Status: DC

## 2020-07-26 NOTE — Telephone Encounter (Signed)
Spoke with pt regarding Dr. Hazle Coca recommendations. Pt will resume taking simvastatin 40mg  daily and per Dr. will add zetia 10mg  daily and check lipid levels in 2 months. All questions answered for pt. Pt verbalizes understanding.

## 2020-09-05 ENCOUNTER — Other Ambulatory Visit: Payer: Self-pay | Admitting: Cardiovascular Disease

## 2020-10-31 ENCOUNTER — Other Ambulatory Visit: Payer: Self-pay | Admitting: *Deleted

## 2020-10-31 DIAGNOSIS — E782 Mixed hyperlipidemia: Secondary | ICD-10-CM

## 2021-04-14 ENCOUNTER — Other Ambulatory Visit: Payer: Self-pay | Admitting: Cardiovascular Disease

## 2021-05-28 ENCOUNTER — Other Ambulatory Visit: Payer: Self-pay

## 2021-05-28 DIAGNOSIS — I6523 Occlusion and stenosis of bilateral carotid arteries: Secondary | ICD-10-CM

## 2021-06-11 ENCOUNTER — Ambulatory Visit (INDEPENDENT_AMBULATORY_CARE_PROVIDER_SITE_OTHER): Payer: Medicare HMO

## 2021-06-11 ENCOUNTER — Other Ambulatory Visit: Payer: Self-pay | Admitting: Cardiovascular Disease

## 2021-06-11 DIAGNOSIS — I6523 Occlusion and stenosis of bilateral carotid arteries: Secondary | ICD-10-CM | POA: Diagnosis not present

## 2021-07-11 ENCOUNTER — Other Ambulatory Visit: Payer: Self-pay | Admitting: Cardiovascular Disease

## 2021-07-20 IMAGING — DX DG CHEST 2V
2 series · 2 of 2 positions shown · non-contrast
Comparison: 10/25/2007

CLINICAL DATA: Intermittent productive cough several days.

EXAM:
CHEST - 2 VIEW

[chest pa]
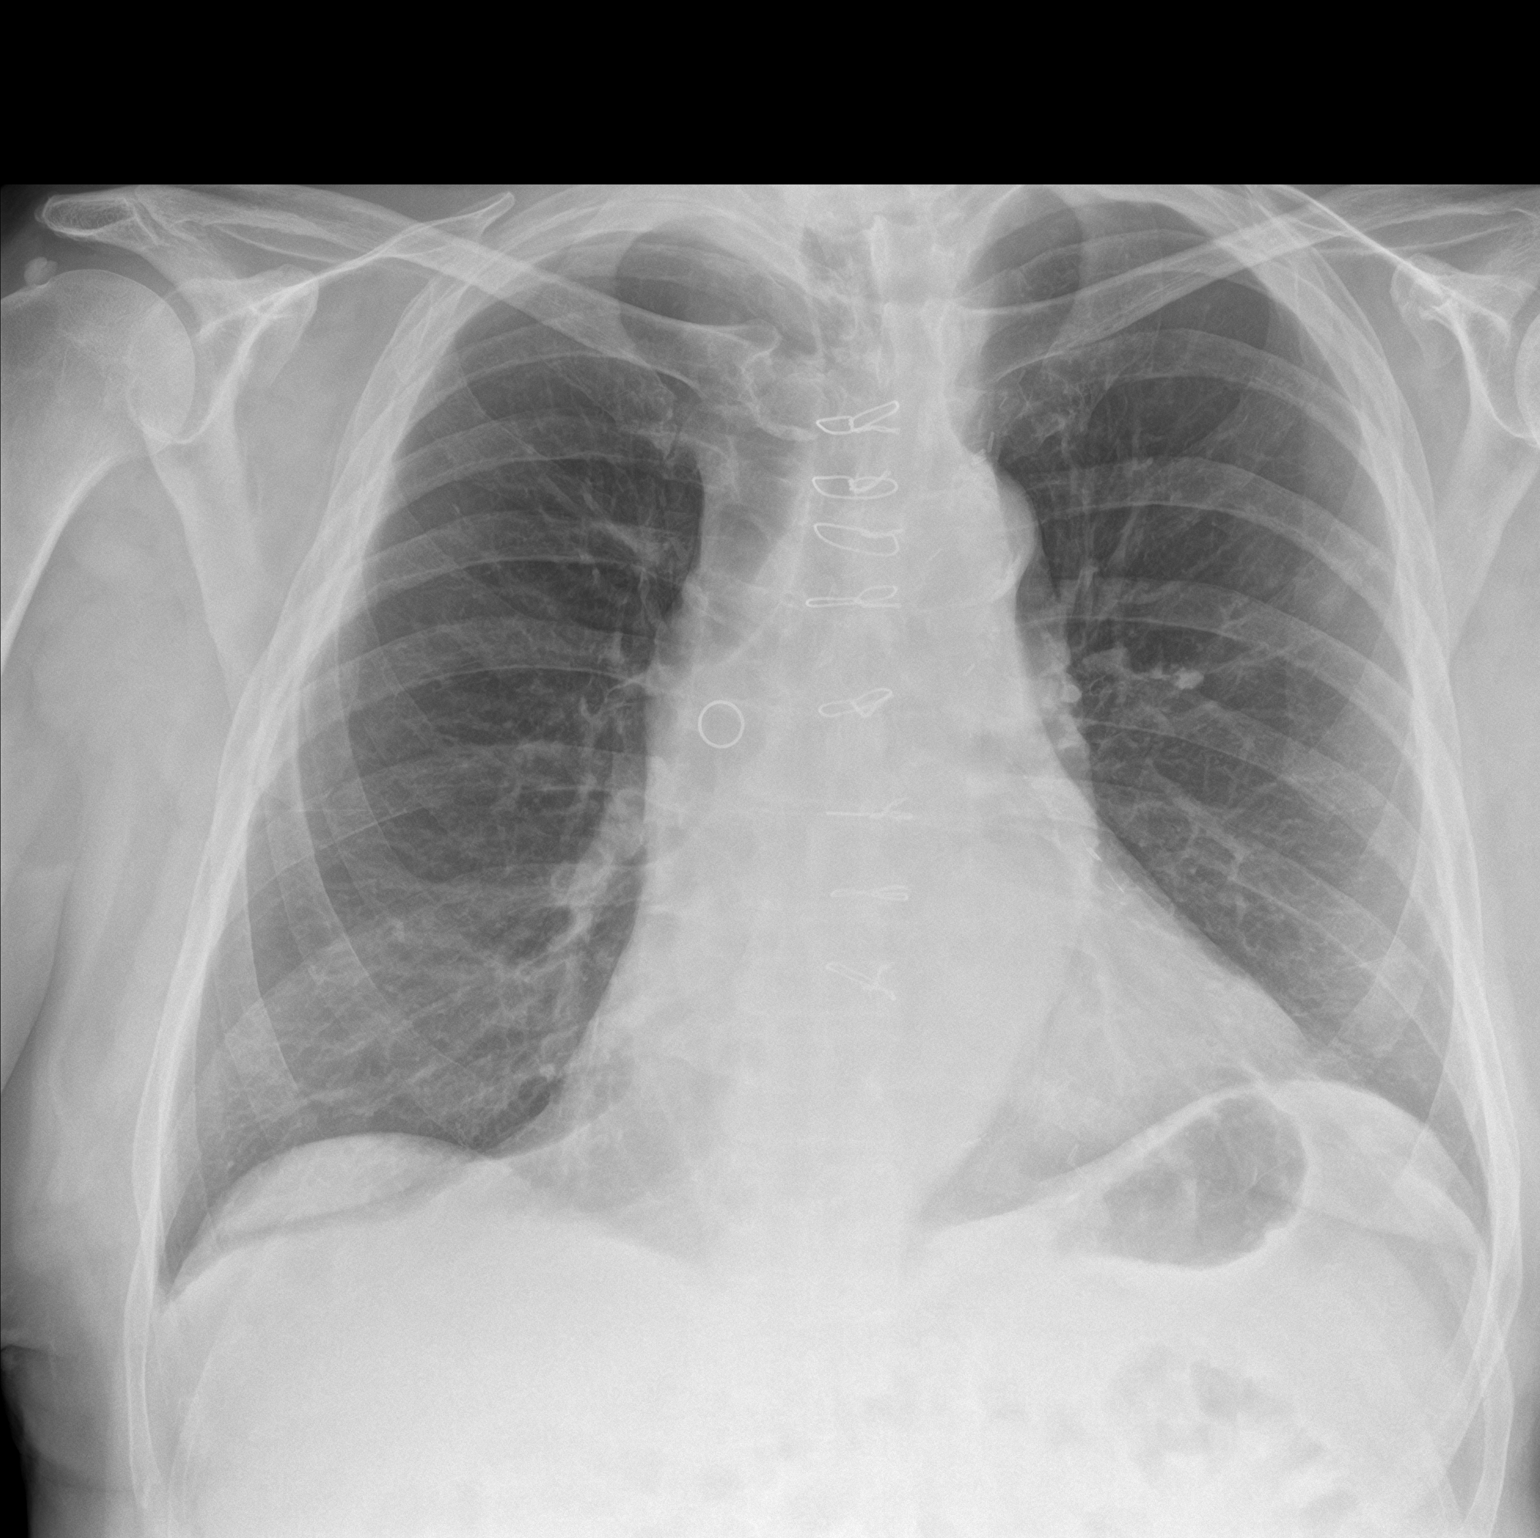

[chest lat]
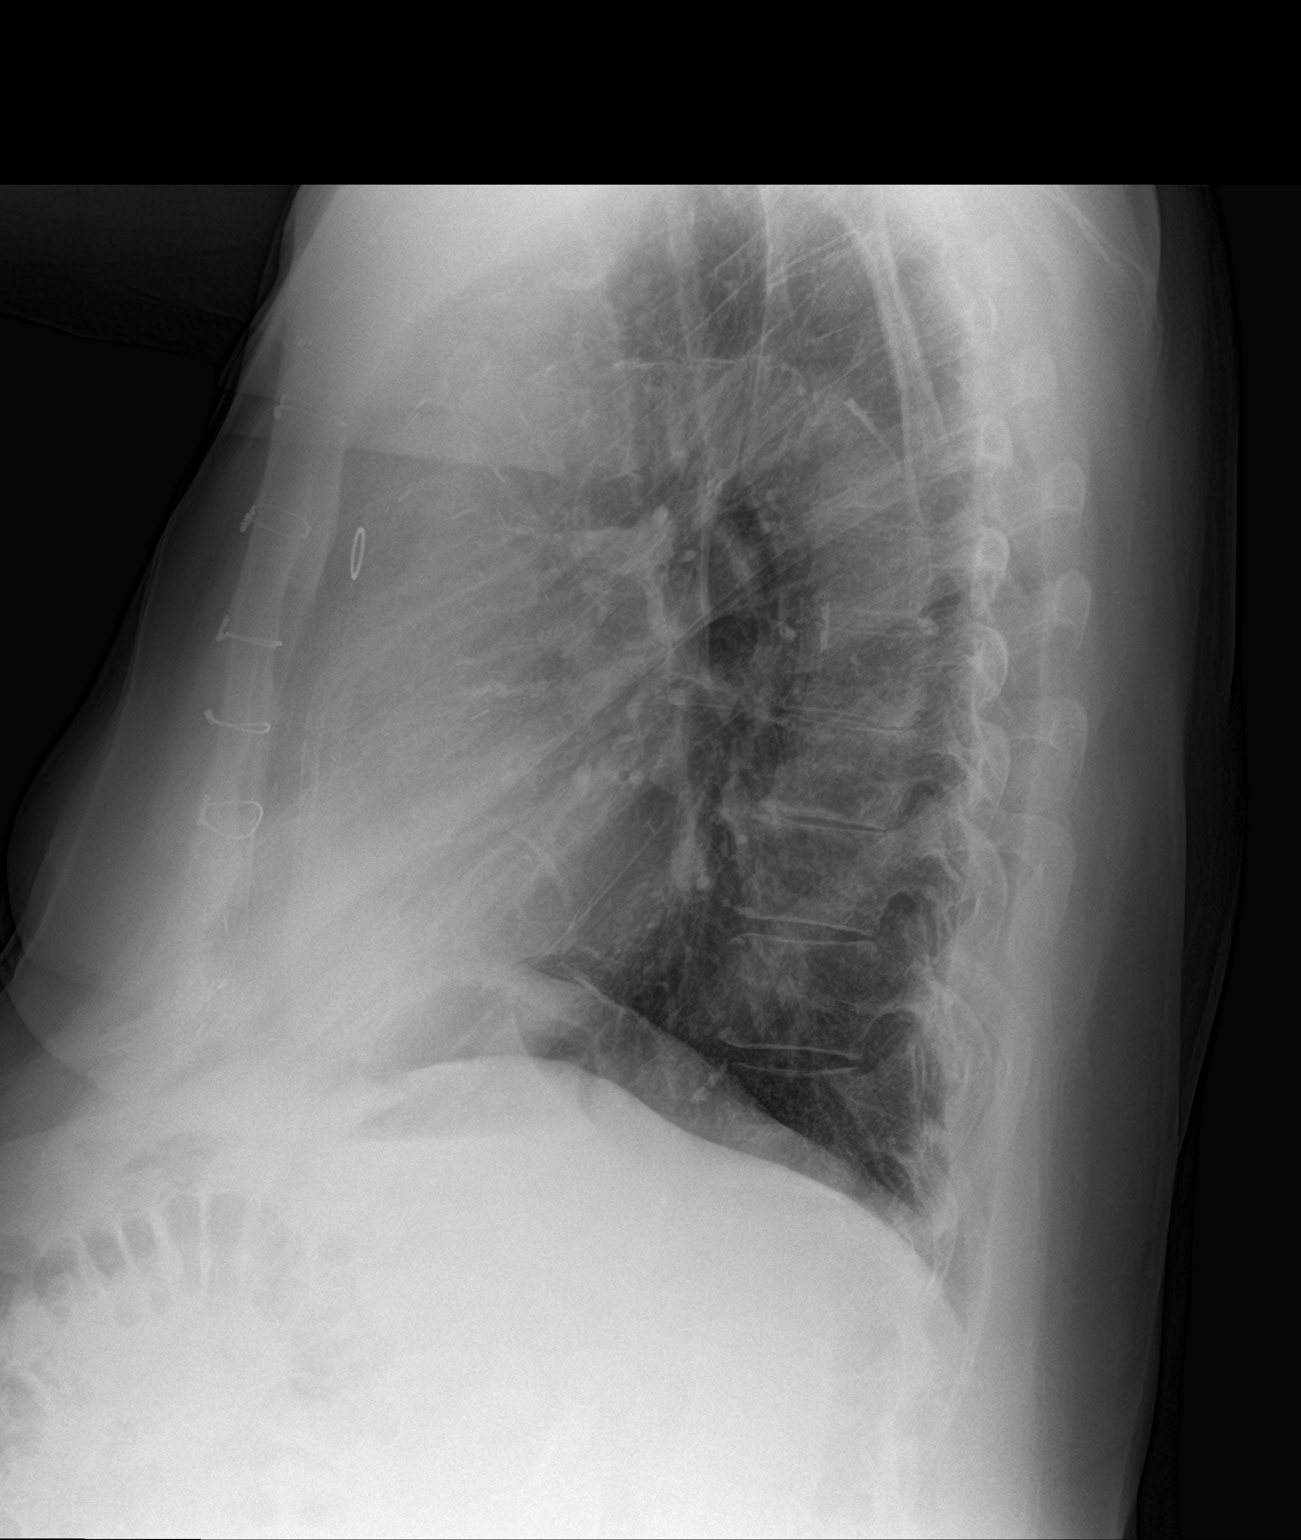

[2 of 2 positions shown; findings below may reference images not displayed]

FINDINGS: Lungs are adequately inflated without focal airspace consolidation
or effusion. Cardiomediastinal silhouette is within normal. Mild
degenerative change of the spine. 7 mm calcific fragment abutting
the lateral aspect of the right humeral head which may be seen with
calcific tendinitis.
IMPRESSION: No acute cardiopulmonary disease.

## 2021-07-25 ENCOUNTER — Other Ambulatory Visit (HOSPITAL_COMMUNITY): Payer: Self-pay | Admitting: Cardiovascular Disease

## 2021-07-25 DIAGNOSIS — I6523 Occlusion and stenosis of bilateral carotid arteries: Secondary | ICD-10-CM

## 2021-08-10 ENCOUNTER — Other Ambulatory Visit: Payer: Self-pay | Admitting: Cardiovascular Disease

## 2021-08-14 ENCOUNTER — Other Ambulatory Visit: Payer: Self-pay | Admitting: Cardiovascular Disease

## 2021-08-29 ENCOUNTER — Encounter: Payer: Self-pay | Admitting: Cardiovascular Disease

## 2021-08-29 ENCOUNTER — Ambulatory Visit: Payer: Medicare HMO | Admitting: Cardiovascular Disease

## 2021-08-29 DIAGNOSIS — I6523 Occlusion and stenosis of bilateral carotid arteries: Secondary | ICD-10-CM

## 2021-08-29 DIAGNOSIS — I2510 Atherosclerotic heart disease of native coronary artery without angina pectoris: Secondary | ICD-10-CM

## 2021-08-29 DIAGNOSIS — I10 Essential (primary) hypertension: Secondary | ICD-10-CM | POA: Diagnosis not present

## 2021-08-29 DIAGNOSIS — E782 Mixed hyperlipidemia: Secondary | ICD-10-CM

## 2021-08-29 DIAGNOSIS — I499 Cardiac arrhythmia, unspecified: Secondary | ICD-10-CM

## 2021-08-29 NOTE — Assessment & Plan Note (Signed)
History of palpitations with event monitor in the past showing occasional PACs, PVCs and short runs of nonsustained ventricular tachycardia and SVT.  These really do not bother him I do not think they need intervention.

## 2021-08-29 NOTE — Patient Instructions (Signed)
Medication Instructions:  Your physician recommends that you continue on your current medications as directed. Please refer to the Current Medication list given to you today.  *If you need a refill on your cardiac medications before your next appointment, please call your pharmacy*    Testing/Procedures: Dr. Berry has recommended that you have an Ultrasound of your AORTA/IVC/ILIACS.   To prepare for this test:  No food after 11PM the night before. Water is OK. (Don't drink liquids if you have been instructed not to for ANOTHER test).  Avoid foods that produce bowel gas, for 24 hours prior to exam (see below). No breakfast, no chewing gum, no smoking or carbonated beverages. Patient may take morning medications with water. Come in for test at least 15 minutes early to register.  Your physician has requested that you have a lower extremity arterial duplex. This test is an ultrasound of the arteries in the legs. It looks at arterial blood flow in the legs. Allow one hour for Lower Arterial scans. There are no restrictions or special instructions  Your physician has requested that you have an ankle brachial index (ABI). During this test an ultrasound and blood pressure cuff are used to evaluate the arteries that supply the arms and legs with blood. Allow thirty minutes for this exam. There are no restrictions or special instructions. These procedures will be done at [address]    Follow-Up: At [facility], you and your health needs are our priority.  As part of our continuing mission to provide you with exceptional heart care, we have created designated Provider Care Teams.  These Care Teams include your primary Cardiologist (physician) and Advanced Practice Providers (APPs -  Physician Assistants and Nurse Practitioners) who all work together to provide you with the care you need, when you need it.  We recommend signing up for the patient portal called "MyChart".  Sign up  information is provided on this After Visit Summary.  MyChart is used to connect with patients for Virtual Visits (Telemedicine).  Patients are able to view lab/test results, encounter notes, upcoming appointments, etc.  Non-urgent messages can be sent to your provider as well.   To learn more about what you can do with MyChart, go to [email or web address].    Your next appointment:   12 month(s)  The format for your next appointment:   In Person  Provider:   Jonathan Berry, MD 

## 2021-08-29 NOTE — Progress Notes (Signed)
08/29/2021 ALESSANDER TA   1940/11/17  [id number]  Primary Physician Redmond School, MD Primary Cardiologist: Lorretta Harp MD Lupe Carney, [place]  HPI:  Jason Johnson is a 81 y.o.  mildly overweight, married Caucasian male, father to 2, (father to 45 stepchildren,) grandfather to 90 great-grandchildren who I last saw in the office 05/04/2020. He has a history of CAD status post coronary artery bypass grafting by Dr. Nolon Lennert at the [facility] in [place], [place]., back in 1999. His risk factors include hypertension and hyperlipidemia. 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 He underwent AngioJet mechanical aspiration thrombectomy, angioplasty and stenting of that graft and has done well since. He denies chest pain or shortness of breath. He had a Myoview performed in our office October 08, 2010, that was remarkable for a very subtle area of inferior ischemia involving inferior septum towards the base with some inferior scar with some mild peri-infarct ischemia.     He does get out and do moderate activity.  He has mild stable angina which has not changed in severity.  His PCP checks his lipid profile which most recently performed 12/24/2018 revealed a total cholesterol of 53, LDL of 83 and HDL 49.  He also has moderate bilateral ICA stenosis which we followed by duplex ultrasound on annual basis.   Since I saw him in the office a year and a half ago he continues to do well.  He is less active than he was in the past because of pain in his hips and legs.  He denies chest pain or shortness of breath.  Carotid Dopplers performed 06/11/2021 showed moderate bilateral ICA stenosis which she will we have been following on an annual basis.  His most recent lipid profile performed by his PCP 4//5/23 revealed total cholesterol of 136, LDL 70 and HDL of 49.   Current Meds  Medication Sig   aspirin EC 81 MG  tablet Take 1 tablet (81 mg total) by mouth daily.   carvedilol (COREG) 12.5 MG tablet TAKE 1 TABLET BY MOUTH 2 TIMES DAILY WITH A MEAL. SCHEDULE AN APPOINTMENT FOR FURTHER REFILLS   Cholecalciferol (VITAMIN D3 PO) Take by mouth daily.   clopidogrel (PLAVIX) 75 MG tablet TAKE 1 TABLET BY MOUTH EVERY DAY   Coenzyme Q10 (CO Q 10 PO) Take by mouth daily.   ibuprofen (ADVIL,MOTRIN) 200 MG tablet Take 400-600 mg by mouth daily as needed for moderate pain.   nitroGLYCERIN (NITROSTAT) 0.4 MG SL tablet Place 1 tablet (0.4 mg total) under the tongue every 5 (five) minutes as needed for chest pain.   simvastatin (ZOCOR) 40 MG tablet TAKE 1 TABLET BY MOUTH EVERYDAY AT BEDTIME     Allergies  Allergen Reactions   Levaquin [Levofloxacin] Itching and Rash    Social History   Socioeconomic History   Marital status: Married    Spouse name: Not on file   Number of children: Not on file   Years of education: Not on file   Highest education level: Not on file  Occupational History   Not on file  Tobacco Use   Smoking status: Never   Smokeless tobacco: Never  Vaping Use   Vaping Use: Never used  Substance and Sexual Activity   Alcohol use: Yes    Comment: Occasionally   Drug use: No   Sexual activity: Not on file  Other Topics Concern  Not on file  Social History Narrative   Not on file   Social Determinants of Health   Financial Resource Strain: Not on file  Food Insecurity: Not on file  Transportation Needs: Not on file  Physical Activity: Not on file  Stress: Not on file  Social Connections: Not on file  Intimate Partner Violence: Not on file     Review of Systems: General: negative for chills, fever, night sweats or weight changes.  Cardiovascular: negative for chest pain, dyspnea on exertion, edema, orthopnea, palpitations, paroxysmal nocturnal dyspnea or shortness of breath Dermatological: negative for rash Respiratory: negative for cough or wheezing Urologic: negative for  hematuria Abdominal: negative for nausea, vomiting, diarrhea, bright red blood per rectum, melena, or hematemesis Neurologic: negative for visual changes, syncope, or dizziness All other systems reviewed and are otherwise negative except as noted above.    Blood pressure (!) 160/70, pulse (!) 58, height 6' (1.829 m), weight 216 lb 3.2 oz (98.1 kg), SpO2 96 %.  General appearance: alert and no distress Neck: no adenopathy, no JVD, supple, symmetrical, trachea midline, thyroid not enlarged, symmetric, no tenderness/mass/nodules, and soft bilateral carotid bruits Lungs: clear to auscultation bilaterally Heart: regular rate and rhythm, S1, S2 normal, no murmur, click, rub or gallop Extremities: extremities normal, atraumatic, no cyanosis or edema Pulses: 2+ and symmetric Skin: Skin color, texture, turgor normal. No rashes or lesions Neurologic: Grossly normal  EKG sinus bradycardia 58 with marked sinus arrhythmia.  I personally reviewed this EKG.  ASSESSMENT AND PLAN:   Coronary artery disease History of CAD status post coronary artery bypass grafting by Dr. Nolon Lennert at the [facility] in 1999.  He had inferior wall microinfarction 10/25/2007 occurring secondary to occlusion of his RCA vein graft.  He underwent AngioJet mechanical aspiration thrombectomy, PCI and stenting and has done well since.  He denies chest pain or shortness of breath.  Carotid artery disease (HCC) History of moderate bilateral ICA stenosis by recent duplex ultrasound 06/11/2021 which has remained stable compared to his previous study a year ago.  We will continue to follow him noninvasively.    Essential hypertension History of essential hypertension a blood pressure measured today at 160/70.  He is on carvedilol.  He checks his blood pressure at home which runs in the 135/65 range.  Hyperlipidemia History of hyperlipidemia on statin therapy and Zetia with lipid profile performed by his PCP//23  revealing a total cholesterol 136, LDL 70 and HDL 49.  Irregular heartbeat History of palpitations with event monitor in the past showing occasional PACs, PVCs and short runs of nonsustained ventricular tachycardia and SVT.  These really do not bother him I do not think they need intervention.     Lorretta Harp MD FACP,FACC,FAHA, [facility] 08/29/2021 9:43 AM

## 2021-08-29 NOTE — Assessment & Plan Note (Signed)
History of CAD status post coronary artery bypass grafting by Dr. Julaine Fusi at the [facility] in 1999.  He had inferior wall microinfarction 10/25/2007 occurring secondary to occlusion of his RCA vein graft.  He underwent AngioJet mechanical aspiration thrombectomy, PCI and stenting and has done well since.  He denies chest pain or shortness of breath.

## 2021-08-29 NOTE — Assessment & Plan Note (Signed)
History of essential hypertension a blood pressure measured today at 160/70.  He is on carvedilol.  He checks his blood pressure at home which runs in the 135/65 range.

## 2021-08-29 NOTE — Assessment & Plan Note (Signed)
History of moderate bilateral ICA stenosis by recent duplex ultrasound 06/11/2021 which has remained stable compared to his previous study a year ago.  We will continue to follow him noninvasively.

## 2021-08-29 NOTE — Assessment & Plan Note (Signed)
History of hyperlipidemia on statin therapy and Zetia with lipid profile performed by his PCP//23 revealing a total cholesterol 136, LDL 70 and HDL 49.

## 2021-09-03 ENCOUNTER — Other Ambulatory Visit: Payer: Self-pay | Admitting: Cardiovascular Disease

## 2021-09-03 DIAGNOSIS — I739 Peripheral vascular disease, unspecified: Secondary | ICD-10-CM

## 2021-09-04 ENCOUNTER — Telehealth: Payer: Self-pay | Admitting: Cardiovascular Disease

## 2021-09-04 NOTE — Telephone Encounter (Signed)
Patient called and said that the dates that is in Epic for his 2 scheduled test are wrong. Tried to explain to patient that on 09/09/21, he has two tests that are being done but he said that Dr. Hazle Coca nurse had gave him a sheet with 09/09/21 as a date for one test, and 09/10/21 as a date for another date

## 2021-09-04 NOTE — Telephone Encounter (Signed)
Called patient back. He was very upset that his testing was not scheduled correctly- I advised with him I showed all three of his scans were scheduled for 09/09/21. He states this is not correct- his paperwork showed once testing being done on 06/05, and one being completed on 06/06. Patient was advised I was unsure of this, however the testing was scheduled this way as they were all three ultrasounds and could be completed at once- without multiple visits. Patient was not happy with this, he then states he wants them changed. I am unsure of which testing he was referring too as he kept saying "the one scheduled on 06/05" they are all scheduled on this day. To end the conversation he states "I will just show up at 7:15 AM on 06/05, goodbye" will route to primary nurse as he kept stating that she knew what this was about to see if she could better assist, I did not ask to change times of the testing as it seems it would be best to do them all at the same time? Thanks!

## 2021-09-09 ENCOUNTER — Ambulatory Visit (INDEPENDENT_AMBULATORY_CARE_PROVIDER_SITE_OTHER): Payer: Medicare HMO

## 2021-09-09 DIAGNOSIS — I739 Peripheral vascular disease, unspecified: Secondary | ICD-10-CM

## 2021-09-10 ENCOUNTER — Other Ambulatory Visit: Payer: Self-pay | Admitting: Cardiovascular Disease

## 2021-09-18 ENCOUNTER — Telehealth: Payer: Self-pay | Admitting: Cardiovascular Disease

## 2021-09-18 NOTE — Telephone Encounter (Signed)
Patient is returning RN's call regarding his results. Please advise.  

## 2021-09-18 NOTE — Telephone Encounter (Signed)
Spoke with pt regarding results of his lower extremity doppler. Per Dr. Gwenlyn Found, study is essentially normal, we will repeat if/when clinically indicated. Pt verbalizes understanding.

## 2021-12-27 ENCOUNTER — Telehealth: Payer: Self-pay | Admitting: Cardiovascular Disease

## 2021-12-27 NOTE — Telephone Encounter (Signed)
Attempted to call patient, left message for patient to call back to office.   

## 2021-12-27 NOTE — Telephone Encounter (Signed)
Pt requesting test results from [facility] [place] lower extremity test be printed out for him. Pt states he spoke to medical records and they transferred back here.

## 2021-12-31 NOTE — Telephone Encounter (Signed)
Results printed and mailed to the patient

## 2022-01-20 ENCOUNTER — Other Ambulatory Visit: Payer: Self-pay | Admitting: Cardiovascular Disease

## 2022-02-10 ENCOUNTER — Encounter: Payer: Self-pay | Admitting: Neurology

## 2022-02-10 ENCOUNTER — Ambulatory Visit: Payer: Medicare HMO | Admitting: Neurology

## 2022-02-10 VITALS — BP 184/68 | HR 77 | Ht 71.6 in | Wt 214.5 lb

## 2022-02-10 DIAGNOSIS — R531 Weakness: Secondary | ICD-10-CM | POA: Diagnosis not present

## 2022-02-10 DIAGNOSIS — R202 Paresthesia of skin: Secondary | ICD-10-CM | POA: Diagnosis not present

## 2022-02-10 DIAGNOSIS — R269 Unspecified abnormalities of gait and mobility: Secondary | ICD-10-CM

## 2022-02-10 NOTE — Progress Notes (Signed)
Chief Complaint  Patient presents with   New Patient (Initial Visit)    Rm 15. Alone. NP Urgent Paper referral for Neuropathy, leg weakness, ? neurogenic claudication/Lawrence Gerarda Fraction MD [place].      ASSESSMENT AND PLAN  [name] is a 81 y.o. male   Gradual onset of lower extremity weakness, gait abnormality, distal sensory loss  Examination today showed length-dependent sensory changes, mild to moderate distal lower extremity weakness, brisk upper extremity patellar reflex, absent ankle reflex  Known History of lumbar degenerative changes,  Differentiation diagnosis includes spondylitic myelopathy, with superimposed lumbar radiculopathy, peripheral neuropathy  Discussed with patient, proceed with MRI of the cervical, lumbar spine, EMG nerve conduction study, Get lab record from PCP Vit B12, D, CPK, CMP, ESR were ordered.  DIAGNOSTIC DATA (LABS, IMAGING, TESTING) - I reviewed patient records, labs, notes, testing and imaging myself where available. [name] in March 2023, followed by Cardiologist Dr. Gwenlyn Found Right Carotid: Velocities in the right ICA are consistent with a 60-79% stenosis. The ECA appears >50% stenosed. Left Carotid: Velocities in the left ICA are consistent with a 40-59% stenosis. Vertebrals: Bilateral vertebral arteries demonstrate antegrade flow. Subclavians: Normal flow hemodynamics were seen in bilateral subclavian arteries.  Arterial doppler study of LEs in June 2023. Right: Resting right ankle-brachial index is within normal range. No evidence of significant right lower extremity arterial disease. The right toe-brachial index is normal. Left: Resting left ankle-brachial index is within normal range. No evidence of significant left lower extremity arterial disease. The left toe-brachial index is normal.  MEDICAL HISTORY:  ADRIEL Johnson is a 81 year old male, seen in request by his primary care at [facility] at [facility] Dr.  Redmond [employer], for evaluation of gait abnormality, initial evaluation was on February 10, 2022  I reviewed and summarized the referring note. PMHX. HTN HLD CAD, cabg in 1999, stent in 2009. Carotid artery stenosis  He has become sedentary since pandemic, 2020, spent most of the time watching TV, now with his significant coronary artery disease, carotid artery stenosis, he wants to become physically active and then realize that he has trouble walking, lack of stamina, he can manage from parking lot to office, but complains of lower extremity heaviness, does not coordinate, also noticed gradual onset bilateral toes numbness tingling  He denies bowel bladder incontinence, denies significant neck and low back pain,  Labs in April 2023, CMP, creat 0.84, CBC, E52 778, Folic 8.7, noral TSH, Vit D 15.7  PHYSICAL EXAM:   Vitals:   02/10/22 1119  BP: (!) 184/68  Pulse: 77  Weight: 214 lb 8 oz (97.3 kg)  Height: 5' 11.6" (1.819 m)   Not recorded     Body mass index is 29.42 kg/m.  PHYSICAL EXAMNIATION:  Gen: NAD, conversant, well nourised, well groomed                     Cardiovascular: Regular rate rhythm, no peripheral edema, warm, nontender. Eyes: Conjunctivae clear without exudates or hemorrhage Neck: Supple, no carotid bruits. Pulmonary: Clear to auscultation bilaterally   NEUROLOGICAL EXAM:  MENTAL STATUS: Speech/cognition: Awake, alert, oriented to history taking and casual conversation CRANIAL NERVES: CN II: Visual fields are full to confrontation. Pupils are round equal and briskly reactive to light. CN III, IV, VI: extraocular movement are normal. No ptosis. CN V: Facial sensation is intact to light touch CN VII: Face is symmetric with normal eye closure  CN VIII: Hearing is normal  to causal conversation. CN IX, X: Phonation is normal. CN XI: Head turning and shoulder shrug are intact  MOTOR: Mild bilateral shoulder abduction, external rotation weakness Bilateral lower  extremities pitting edema, mild to moderate bilateral ankle dorsiflexion, toe flexion extension weakness  REFLEXES: Reflexes are 2+ and symmetric at the biceps, triceps, 3/3 at knees, and absent at ankles. Plantar responses are flexor.  SENSORY: Length-dependent decreased light touch, pinprick, vibratory sensation to mid shin level  COORDINATION: There is no trunk or limb dysmetria noted.  GAIT/STANCE: He can get up from seated position with effort by not pushing on chair arm, cautious, mildly unsteady, difficulty standing on tiptoe, heel Romberg is absent.  REVIEW OF SYSTEMS:  Full 14 system review of systems performed and notable only for as above All other review of systems were negative.   ALLERGIES: Allergies  Allergen Reactions   Levaquin [Levofloxacin] Itching and Rash    HOME MEDICATIONS: Current Outpatient Medications  Medication Sig Dispense Refill   aspirin EC 81 MG tablet Take 1 tablet (81 mg total) by mouth daily. 90 tablet 3   carvedilol (COREG) 12.5 MG tablet Take 1 tablet (12.5 mg total) by mouth 2 (two) times daily with a meal. 180 tablet 2   Cholecalciferol (VITAMIN D3 PO) Take 2,000 Units by mouth daily.     clopidogrel (PLAVIX) 75 MG tablet TAKE 1 TABLET BY MOUTH EVERY DAY 90 tablet 2   Coenzyme Q10 (CO Q 10 PO) Take by mouth daily.     ibuprofen (ADVIL,MOTRIN) 200 MG tablet Take 400-600 mg by mouth daily as needed for moderate pain.     nitroGLYCERIN (NITROSTAT) 0.4 MG SL tablet Place 1 tablet (0.4 mg total) under the tongue every 5 (five) minutes as needed for chest pain. 25 tablet 4   simvastatin (ZOCOR) 40 MG tablet TAKE 1 TABLET BY MOUTH EVERYDAY AT BEDTIME 90 tablet 3   No current facility-administered medications for this visit.    PAST MEDICAL HISTORY: Past Medical History:  Diagnosis Date   Carotid artery disease ([place])    Coronary artery disease    history of coronary artery bypass grafting in 1999   Gout    Hyperlipidemia    Hypertension     Myocardial infarction [facility]) 2009    PAST SURGICAL HISTORY: Past Surgical History:  Procedure Laterality Date   CARDIAC CATHETERIZATION  2009   CATARACT EXTRACTION W/PHACO Right 09/09/2012   Procedure: CATARACT EXTRACTION PHACO AND INTRAOCULAR LENS PLACEMENT ([place]);  Surgeon: Tonny Branch, MD;  Location: [facility] ORS;  Service: Ophthalmology;  Laterality: Right;  CDE 14.48   CATARACT EXTRACTION W/PHACO Left 10/07/2012   Procedure: CATARACT EXTRACTION PHACO AND INTRAOCULAR LENS PLACEMENT ([facility]);  Surgeon: Tonny Branch, MD;  Location: [facility];  Service: Ophthalmology;  Laterality: Left;  CDE:12.24   COLONOSCOPY     COLONOSCOPY N/A 02/11/2017   Procedure: COLONOSCOPY;  Surgeon: Daneil Dolin, MD;  Location: [facility] ENDO SUITE;  Service: Endoscopy;  Laterality: N/A;  7:30am   CORONARY ARTERY BYPASS GRAFT  2004   triple   DOPPLER ECHOCARDIOGRAPHY  2010   NM MYOVIEW LTD  2012   TONSILLECTOMY     as a child    FAMILY HISTORY: Family History  Problem Relation Age of Onset   Colon cancer Neg Hx     SOCIAL HISTORY: Social History   Socioeconomic History   Marital status: Married    Spouse name: Not on file   Number of children: Not on file   Years of  education: Not on file   Highest education level: Not on file  Occupational History   Not on file  Tobacco Use   Smoking status: Never   Smokeless tobacco: Never  Vaping Use   Vaping Use: Never used  Substance and Sexual Activity   Alcohol use: Yes    Comment: Occasionally   Drug use: No   Sexual activity: Not on file  Other Topics Concern   Not on file  Social History Narrative   Not on file   Social Determinants of Health   Financial Resource Strain: Not on file  Food Insecurity: Not on file  Transportation Needs: Not on file  Physical Activity: Not on file  Stress: Not on file  Social Connections: Not on file  Intimate Partner Violence: Not on file      Marcial Pacas, M.D. Ph.D.  [facility] [address]  [place], [place] [address] Ph: [phone] Fax: [phone]  CC:  Redmond School, MD [place],  [place] [address]  Redmond School, MD

## 2022-02-11 ENCOUNTER — Telehealth: Payer: Self-pay | Admitting: Neurology

## 2022-02-11 NOTE — Telephone Encounter (Signed)
[employer] medicare sent to [facility] they obtain auth [id number] 

## 2022-03-09 ENCOUNTER — Other Ambulatory Visit: Payer: Medicare HMO

## 2022-04-10 ENCOUNTER — Ambulatory Visit
Admission: RE | Admit: 2022-04-10 | Discharge: 2022-04-10 | Disposition: A | Discharge status: Home / Self Care | Payer: Medicare HMO | Source: Ambulatory Visit | Attending: Neurology | Admitting: Neurology

## 2022-04-10 DIAGNOSIS — R202 Paresthesia of skin: Secondary | ICD-10-CM

## 2022-04-10 DIAGNOSIS — R531 Weakness: Secondary | ICD-10-CM | POA: Diagnosis not present

## 2022-04-10 DIAGNOSIS — R269 Unspecified abnormalities of gait and mobility: Secondary | ICD-10-CM

## 2022-04-15 ENCOUNTER — Telehealth: Payer: Self-pay | Admitting: Neurology

## 2022-04-15 DIAGNOSIS — R269 Unspecified abnormalities of gait and mobility: Secondary | ICD-10-CM

## 2022-04-15 DIAGNOSIS — R531 Weakness: Secondary | ICD-10-CM

## 2022-04-15 DIAGNOSIS — R202 Paresthesia of skin: Secondary | ICD-10-CM

## 2022-04-15 NOTE — Telephone Encounter (Signed)
Please call patient, MRI of cervical and lumbar spine showed multi-level degenerative changes, variable degree of foraminal narrowing, but no significant canal stenosis, will see him at [facility] in Feb. Will review films with him then.  IMPRESSION: This MRI of the lumbar spine without contrast shows the following: At L2-L3, there is severe loss of disc height associated with mild retrolisthesis.  There is moderately severe right lateral recess stenosis with impingement of the right L3 nerve root.  Compared to the 2010 MRI, this level has slightly progressed. At L3-L4, there are degenerative changes causing moderately severe left lateral recess stenosis that could impinge upon the left L4 nerve root.  Degenerative changes at this level have mildly progressed compared to the 2010 MRI. At L4-L5, there is a right paramedian disc protrusion and other degenerative change causing mild spinal stenosis and moderately severe left lateral recess stenosis which could impinge upon the left L5 nerve root.  Degenerative changes have mildly progressed at this level At L5-S1, there is very minimal retrolisthesis and other degenerative change but no spinal stenosis or nerve root compression.  Degenerative changes at this level have improved compared to the 2010 MRI with resolution of the right S1 nerve root impingement    IMPRESSION: This MRI of the cervical spine shows the following: The spinal cord appears normal. At C3-C4, degenerative changes cause moderately severe right foraminal narrowing.  There could be impingement of the right C4 nerve root. At C4-C5, anterolisthesis and other degenerative changes cause moderate foraminal narrowing but no nerve root compression or spinal stenosis. At C5-C6, there is borderline spinal stenosis due to degenerative changes.  There is moderately severe left foraminal narrowing with potential for left C6 nerve root impingement. At C6-C7, there is mild spinal stenosis and moderately  severe left foraminal narrowing with potential for left C6 nerve root compression.   At C7-T1, there are degenerative changes more to the right but no spinal stenosis or nerve root compression. At T1-T2, there is mild anterolisthesis and other degenerative change causing moderate foraminal narrowing but no spinal stenosis or nerve root compression.

## 2022-05-23 ENCOUNTER — Ambulatory Visit: Payer: Medicare HMO | Admitting: Neurology

## 2022-05-23 VITALS — BP 170/79 | HR 60 | Ht 71.6 in | Wt 215.0 lb

## 2022-05-23 DIAGNOSIS — M47816 Spondylosis without myelopathy or radiculopathy, lumbar region: Secondary | ICD-10-CM

## 2022-05-23 DIAGNOSIS — G629 Polyneuropathy, unspecified: Secondary | ICD-10-CM | POA: Insufficient documentation

## 2022-05-23 DIAGNOSIS — G6289 Other specified polyneuropathies: Secondary | ICD-10-CM | POA: Diagnosis not present

## 2022-05-23 DIAGNOSIS — R269 Unspecified abnormalities of gait and mobility: Secondary | ICD-10-CM

## 2022-05-23 DIAGNOSIS — R202 Paresthesia of skin: Secondary | ICD-10-CM

## 2022-05-23 DIAGNOSIS — R531 Weakness: Secondary | ICD-10-CM

## 2022-05-23 DIAGNOSIS — R2689 Other abnormalities of gait and mobility: Secondary | ICD-10-CM

## 2022-05-23 NOTE — Procedures (Signed)
Full Name: Jason Johnson Gender: Male MRN #: [id number] Date of Birth: 09-Mar-1941    Visit Date: 05/23/2022 08:14 Age: 82 Years Examining Physician: Dr. Marcial Pacas Referring Physician: Dr. Marcial Pacas Height: 5 feet 11 inch History: 82 year old male, history of chronic low back pain, presenting with gradual onset bilateral lower extremity paresthesia, gait abnormality  Summary of the test:   Nerve conduction study:  Left sural, superficial peroneal sensory responses were absent.  Bilateral tibial, left peroneal motor responses were absent.  Right peroneal to EDB motor response showed significantly decreased CMAP amplitude.  Bilateral ulnar sensory response showed mildly prolonged peak latency with borderline or mildly decreased snap amplitude.  Bilateral median sensory responses showed moderately prolonged peak latency with mild to moderately decreased snap amplitude.  Bilateral ulnar motor responses were normal.  Left median motor response showed borderline prolonged distal latency with normal CMAP amplitude.  Right median motor responses were normal.  Electromyography Selective needle examinations were performed at bilateral lower extremity muscles, lumbosacral paraspinal muscles, left upper extremity muscles.  There was evidence of active denervation, active neuropathic changes involving bilateral distal leg muscles, tibialis anterior tibialis posterior; chronic neuropathic changes involving bilateral medial gastrocnemius, vastus lateralis.  There was increased insertional activity at bilateral lumbosacral paraspinal muscles, but no spontaneous activity, polyphasic motor unit potential  There is evidence of mild chronic neuropathic changes at left first dorsal interossei, rest of the upper extremity muscle examination showed no significant abnormalities.  He could not tolerate needle examination of left cervical paraspinal muscles.   Conclusion: This is an abnormal  study.  There is electrodiagnostic evidence of length-dependent severe axonal sensorimotor polyneuropathy, with likely superimposed mild chronic bilateral lumbosacral radiculopathy, involving bilateral L4, L5 and S1.  In addition, there is evidence of bilateral carpal tunnel syndromes, left side is moderate, right side is mild.    ------------------------------- Marcial Pacas M.D. PhD  [facility] [address], [place], [place] [address] Tel: [phone] Fax: [phone]  Verbal informed consent was obtained from the patient, patient was informed of potential risk of procedure, including bruising, bleeding, hematoma formation, infection, muscle weakness, muscle pain, numbness, among others.        [place]    Nerve / Sites Muscle Latency Ref. Amplitude Ref. Rel Amp Segments Distance Velocity Ref. Area    ms ms mV mV %  cm m/s m/s mVms  R Median - APB     Wrist APB 4.3 ?4.4 5.0 ?4.0 100 Wrist - APB 7   19.5     Upper arm APB 10.2  4.6  92.6 Upper arm - Wrist 29 50 ?49 18.8  L Median - APB     Wrist APB 4.4 ?4.4 6.4 ?4.0 100 Wrist - APB 7   18.6     Upper arm APB 9.8  6.8  106 Upper arm - Wrist 27 50 ?49 19.7  R Ulnar - ADM     Wrist ADM 2.6 ?3.3 9.5 ?6.0 100 Wrist - ADM 7   32.2     B.Elbow ADM 5.4  9.9  105 B.Elbow - Wrist 15.6 56 ?49 29.0     A.Elbow ADM 9.1  8.7  87.6 A.Elbow - B.Elbow 19 52 ?49 30.6  L Ulnar - ADM     Wrist ADM 2.8 ?3.3 7.9 ?6.0 100 Wrist - ADM 7   28.0     B.Elbow ADM 5.8  7.6  96.3 B.Elbow - Wrist 17 56 ?49 29.4  A.Elbow ADM 8.8  7.1  93.3 A.Elbow - B.Elbow 15 50 ?49 28.1  L Peroneal - EDB     Ankle EDB NR ?6.5 NR ?2.0 NR Ankle - EDB 9   NR     Fib head EDB      Fib head - Ankle   ?44          Pop fossa - Ankle      R Peroneal - EDB     Ankle EDB 8.5 ?6.5 0.7 ?2.0 100 Ankle - EDB 9   1.9     Fib head EDB      Fib head - Ankle   ?44      Pop fossa EDB 20.5  0.7   Pop fossa - Fib head   ?44 1.9         Pop fossa - Ankle      L  Tibial - AH     Ankle AH NR ?5.8 NR ?4.0 NR Ankle - AH 9   NR  R Tibial - AH     Ankle AH NR ?5.8 NR ?4.0 NR Ankle - AH 9   NR                     SNC    Nerve / Sites Rec. Site Peak Lat Ref.  Amp Ref. Segments Distance    ms ms V V  cm  L Sural - Ankle (Calf)     Calf Ankle NR ?4.4 NR ?6 Calf - Ankle 14  L Superficial peroneal - Ankle     Lat leg Ankle NR ?4.4 NR ?6 Lat leg - Ankle 14  R Median - Orthodromic (Dig II, Mid palm)     Dig II Wrist 4.1 ?3.4 9 ?10 Dig II - Wrist 13  L Median - Orthodromic (Dig II, Mid palm)     Dig II Wrist 4.0 ?3.4 6 ?10 Dig II - Wrist 13  R Ulnar - Orthodromic, (Dig V, Mid palm)     Dig V Wrist 3.2 ?3.1 4 ?5 Dig V - Wrist 11  L Ulnar - Orthodromic, (Dig V, Mid palm)     Dig V Wrist 3.2 ?3.1 5 ?5 Dig V - Wrist 26                 F  Wave    Nerve F Lat Ref.   ms ms  R Ulnar - ADM 32.4 ?32.0  L Ulnar - ADM 31.4 ?32.0         EMG Summary Table    Spontaneous MUAP Recruitment  Muscle IA Fib PSW Fasc Other Amp Dur. Poly Pattern  L. Tibialis anterior Increased 1+ 1+ None _______ Increased Increased 1+ Reduced  L. Tibialis posterior Increased 1+ None None _______ Increased Increased 1+ Reduced  L. Peroneus longus Increased None None None _______ Increased Increased 1+ Reduced  L. Gastrocnemius (Medial head) Increased 1+ None None _______ Increased Increased 1+ Reduced  L. Vastus lateralis Normal None None None _______ Increased Increased Normal Reduced  R. Tibialis anterior Increased None None None _______ Normal Normal Normal Reduced  R. Tibialis posterior Increased None None None _______ Normal Normal Normal Reduced  R. Peroneus longus Increased None None None _______ Increased Increased 1+ Reduced  R. Vastus lateralis Increased None None None _______ Increased Increased Normal Reduced  R. Lumbar paraspinals (low) Increased None None None _______ Normal Normal Normal Normal  R. Lumbar paraspinals (mid) Increased None None  None _______ Normal Normal  Normal Normal  L. Lumbar paraspinals (low) Increased None None None _______ Normal Normal Normal Normal  L. Lumbar paraspinals (mid) Increased None None None _______ Normal Normal Normal Normal  L. Abductor hallucis Increased 1+ 1+ None _______ Normal Normal Normal Reduced  L. First dorsal interosseous Increased None None None _______ Normal Normal Normal Reduced  L. Pronator teres Normal None None None _______ Normal Normal Normal Normal  L. Biceps brachii Normal None None None _______ Normal Normal Normal Normal  L. Deltoid Normal None None None _______ Normal Normal Normal Normal  L. Brachioradialis Normal None None None _______ Normal Normal Normal Normal

## 2022-05-23 NOTE — Progress Notes (Signed)
Chief Complaint  Patient presents with   Procedure    Rm EMG/NCV 3.      ASSESSMENT AND PLAN  Jason Johnson is a 82 y.o. male   Gradual onset of lower extremity weakness, gait abnormality, distal sensory loss  EMG nerve conduction study today confirmed severe axonal sensorimotor polyneuropathy, with likely superimposed bilateral lumbosacral radiculopathy,  MRI of lumbar spine and cervical spine does demonstrate multilevel degenerative changes, no evidence of canal stenosis, variable degree of foraminal narrowing, most noticeable at left L3-4, L4-5, which likely explain his worsening left lower extremity symptoms,  Laboratory evaluation for etiology of peripheral neuropathy  Encourage him moderate exercise  Referred to [facility] physical therapy  DIAGNOSTIC DATA (LABS, IMAGING, TESTING) - I reviewed patient records, labs, notes, testing and imaging myself where available. [name] in March 2023, followed by Cardiologist Dr. Gwenlyn Found Right Carotid: Velocities in the right ICA are consistent with a 60-79% stenosis. The ECA appears >50% stenosed. Left Carotid: Velocities in the left ICA are consistent with a 40-59% stenosis. Vertebrals: Bilateral vertebral arteries demonstrate antegrade flow. Subclavians: Normal flow hemodynamics were seen in bilateral subclavian arteries.  Arterial doppler study of LEs in June 2023. Right: Resting right ankle-brachial index is within normal range. No evidence of significant right lower extremity arterial disease. The right toe-brachial index is normal. Left: Resting left ankle-brachial index is within normal range. No evidence of significant left lower extremity arterial disease. The left toe-brachial index is normal.  MEDICAL HISTORY:  Jason Johnson is a 82 year old male, seen in request by his primary care at [facility] at [facility] Dr.  Redmond School, for evaluation of gait abnormality, initial evaluation was on February 10, 2022  I reviewed and summarized the referring note. PMHX. HTN HLD CAD, cabg in 1999, stent in 2009. Carotid artery stenosis  He has become sedentary since pandemic, 2020, spent most of the time watching TV, now with his significant coronary artery disease, carotid artery stenosis, he wants to become physically active and then realize that he has trouble walking, lack of stamina, he can manage from parking lot to office, but complains of lower extremity heaviness, does not coordinate, also noticed gradual onset bilateral toes numbness tingling  He denies bowel bladder incontinence, denies significant neck and low back pain,  Labs in April 2023, CMP, creat 0.84, CBC, [id number], Folic 8.7, noral TSH, Vit D 15.7  UPDATE May 23 2022: EMG nerve conduction study today confirmed severe axonal sensorimotor polyneuropathy, with likely superimposed bilateral lumbosacral radiculopathy involving L4-5 S1 nerve roots.  Personally reviewed MRI of lumbar spine, from January 2024, multilevel degenerative changes, no significant canal stenosis, variable degree of foraminal narrowing, most noticeable at left L4-5, left L3-4, right L2-3  MRI of cervical spine showed multilevel degenerative changes, no canal stenosis, variable degree of foraminal narrowing, mostly at right L3-4, left C6-7,  He complains of lower extremity difficulty, from waist down, numbness of bilateral lower extremity, unsteady gait, especially on the left side, has been sedentary, mild low back pain, but denies significant  radiating pain or neck pain.  PHYSICAL EXAM:   Vitals:   05/23/22 0940  BP: (!) 170/79  Pulse: 60  Weight: 215 lb (97.5 kg)  Height: 5' 11.6" (1.819 m)    Not recorded     Body mass index is 29.49 kg/m.  PHYSICAL EXAMNIATION:  Gen: NAD, conversant, well nourised, well groomed  Cardiovascular: Regular rate rhythm, no peripheral edema, warm, nontender. Eyes: Conjunctivae clear without  exudates or hemorrhage Neck: Supple, no carotid bruits. Pulmonary: Clear to auscultation bilaterally   NEUROLOGICAL EXAM:  MENTAL STATUS: Speech/cognition: Awake, alert, oriented to history taking and casual conversation CRANIAL NERVES: CN II: Visual fields are full to confrontation. Pupils are round equal and briskly reactive to light. CN III, IV, VI: extraocular movement are normal. No ptosis. CN V: Facial sensation is intact to light touch CN VII: Face is symmetric with normal eye closure  CN VIII: Hearing is normal to causal conversation. CN IX, X: Phonation is normal. CN XI: Head turning and shoulder shrug are intact  MOTOR: No significant upper extremity weakness, Bilateral lower extremities pitting edema, mild bilateral ankle dorsiflexion weakness, left more than right, moderate bilateral toe flexion extension weakness,  REFLEXES: Reflexes are 1 and symmetric at the biceps, triceps, 2 at knees, and absent at ankles. Plantar responses are flexor.  SENSORY: Length-dependent decreased light touch, pinprick, vibratory sensation to mid shin level, hairline receding to distal shin  COORDINATION: There is no trunk or limb dysmetria noted.  GAIT/STANCE: He can get up from seated position with effort by not pushing on chair arm, cautious, mildly unsteady, difficulty standing on tiptoe, heel, dragging left leg more    REVIEW OF SYSTEMS:  Full 14 system review of systems performed and notable only for as above All other review of systems were negative.   ALLERGIES: Allergies  Allergen Reactions   Levaquin [Levofloxacin] Itching and Rash    HOME MEDICATIONS: Current Outpatient Medications  Medication Sig Dispense Refill   aspirin EC 81 MG tablet Take 1 tablet (81 mg total) by mouth daily. 90 tablet 3   carvedilol (COREG) 12.5 MG tablet Take 1 tablet (12.5 mg total) by mouth 2 (two) times daily with a meal. 180 tablet 2   Cholecalciferol (VITAMIN D3 PO) Take 2,000 Units by  mouth daily.     clopidogrel (PLAVIX) 75 MG tablet TAKE 1 TABLET BY MOUTH EVERY DAY 90 tablet 2   Coenzyme Q10 (CO Q 10 PO) Take by mouth daily.     ibuprofen (ADVIL,MOTRIN) 200 MG tablet Take 400-600 mg by mouth daily as needed for moderate pain.     nitroGLYCERIN (NITROSTAT) 0.4 MG SL tablet Place 1 tablet (0.4 mg total) under the tongue every 5 (five) minutes as needed for chest pain. 25 tablet 4   simvastatin (ZOCOR) 40 MG tablet TAKE 1 TABLET BY MOUTH EVERYDAY AT BEDTIME 90 tablet 3   No current facility-administered medications for this visit.    PAST MEDICAL HISTORY: Past Medical History:  Diagnosis Date   Carotid artery disease ([place])    Coronary artery disease    history of coronary artery bypass grafting in 1999   Gout    Hyperlipidemia    Hypertension    Myocardial infarction [facility]) 2009    PAST SURGICAL HISTORY: Past Surgical History:  Procedure Laterality Date   CARDIAC CATHETERIZATION  2009   CATARACT EXTRACTION W/PHACO Right 09/09/2012   Procedure: CATARACT EXTRACTION PHACO AND INTRAOCULAR LENS PLACEMENT ([place]);  Surgeon: Tonny Branch, MD;  Location: [facility];  Service: Ophthalmology;  Laterality: Right;  CDE 14.48   CATARACT EXTRACTION W/PHACO Left 10/07/2012   Procedure: CATARACT EXTRACTION PHACO AND INTRAOCULAR LENS PLACEMENT ([facility]);  Surgeon: Tonny Branch, MD;  Location: [facility];  Service: Ophthalmology;  Laterality: Left;  CDE:12.24   COLONOSCOPY     COLONOSCOPY N/A 02/11/2017   Procedure: COLONOSCOPY;  Surgeon:  Rourk, Cristopher Estimable, MD;  Location: [facility] SU[facility];  Service: Endoscopy;  Laterality: N/A;  7:30am   CORONARY ARTERY BYPASS GRAFT  2004   triple   DOPPLER ECHOCARDIOGRAPHY  2010   NM MYOVIEW LTD  2012   TONSILLECTOMY     as a child    FAMILY HISTORY: Family History  Problem Relation Age of Onset   Colon cancer Neg Hx     SOCIAL HISTORY: Social History   Socioeconomic History   Marital status: Married    Spouse name: Not on file   Number of children: Not on  file   Years of education: Not on file   Highest education level: Not on file  Occupational History   Not on file  Tobacco Use   Smoking status: Never   Smokeless tobacco: Never  Vaping Use   Vaping Use: Never used  Substance and Sexual Activity   Alcohol use: Yes    Comment: Occasionally   Drug use: No   Sexual activity: Not on file  Other Topics Concern   Not on file  Social History Narrative   Not on file   Social Determinants of Health   Financial Resource Strain: Not on file  Food Insecurity: Not on file  Transportation Needs: Not on file  Physical Activity: Not on file  Stress: Not on file  Social Connections: Not on file  Intimate Partner Violence: Not on file      Marcial Pacas, M.D. Ph.D.  [facility] [address], [place], [place] [address] Ph: [phone] Fax: [phone]  CC:  Redmond School, MD [place],  [place] [address]  Redmond School, MD

## 2022-05-28 ENCOUNTER — Encounter (HOSPITAL_COMMUNITY): Payer: Self-pay | Admitting: Physical Therapy

## 2022-05-28 ENCOUNTER — Ambulatory Visit (HOSPITAL_COMMUNITY): Payer: Medicare HMO | Attending: Neurology | Admitting: Physical Therapy

## 2022-05-28 ENCOUNTER — Other Ambulatory Visit: Payer: Self-pay

## 2022-05-28 DIAGNOSIS — R2689 Other abnormalities of gait and mobility: Secondary | ICD-10-CM | POA: Diagnosis present

## 2022-05-28 DIAGNOSIS — G6289 Other specified polyneuropathies: Secondary | ICD-10-CM | POA: Insufficient documentation

## 2022-05-28 DIAGNOSIS — R269 Unspecified abnormalities of gait and mobility: Secondary | ICD-10-CM | POA: Insufficient documentation

## 2022-05-28 DIAGNOSIS — M6281 Muscle weakness (generalized): Secondary | ICD-10-CM | POA: Diagnosis present

## 2022-05-28 DIAGNOSIS — M47816 Spondylosis without myelopathy or radiculopathy, lumbar region: Secondary | ICD-10-CM | POA: Insufficient documentation

## 2022-05-28 LAB — CBC WITH DIFFERENTIAL
Basophils Absolute: 0 10*3/uL (ref 0.0–0.2)
Basos: 1 %
EOS (ABSOLUTE): 0.2 10*3/uL (ref 0.0–0.4)
Eos: 3 %
Hematocrit: 46.9 % (ref 37.5–51.0)
Hemoglobin: 15.5 g/dL (ref 13.0–17.7)
Immature Grans (Abs): 0 10*3/uL (ref 0.0–0.1)
Immature Granulocytes: 0 %
Lymphocytes Absolute: 1.6 10*3/uL (ref 0.7–3.1)
Lymphs: 21 %
MCH: 31.3 pg (ref 26.6–33.0)
MCHC: 33 g/dL (ref 31.5–35.7)
MCV: 95 fL (ref 79–97)
Monocytes Absolute: 1 10*3/uL — ABNORMAL HIGH (ref 0.1–0.9)
Monocytes: 12 %
Neutrophils Absolute: 5.1 10*3/uL (ref 1.4–7.0)
Neutrophils: 63 %
RBC: 4.95 x10E6/uL (ref 4.14–5.80)
RDW: 12.1 % (ref 11.6–15.4)
WBC: 8 10*3/uL (ref 3.4–10.8)

## 2022-05-28 LAB — COMPREHENSIVE METABOLIC PANEL
ALT: 8 IU/L (ref 0–44)
AST: 19 IU/L (ref 0–40)
Albumin/Globulin Ratio: 1.6 (ref 1.2–2.2)
Albumin: 4.2 g/dL (ref 3.8–4.8)
Alkaline Phosphatase: 64 IU/L (ref 44–121)
BUN/Creatinine Ratio: 11 (ref 10–24)
BUN: 12 mg/dL (ref 8–27)
Bilirubin Total: 1.1 mg/dL (ref 0.0–1.2)
CO2: 23 mmol/L (ref 20–29)
Calcium: 9.5 mg/dL (ref 8.6–10.2)
Chloride: 101 mmol/L (ref 96–106)
Creatinine, Ser: 1.11 mg/dL (ref 0.76–1.27)
Globulin, Total: 2.7 g/dL (ref 1.5–4.5)
Glucose: 96 mg/dL (ref 70–99)
Potassium: 4.7 mmol/L (ref 3.5–5.2)
Sodium: 139 mmol/L (ref 134–144)
Total Protein: 6.9 g/dL (ref 6.0–8.5)
eGFR: 68 mL/min/{1.73_m2} (ref >59)

## 2022-05-28 LAB — MULTIPLE MYELOMA PANEL, SERUM
Albumin SerPl Elph-Mcnc: 3.6 g/dL (ref 2.9–4.4)
Albumin/Glob SerPl: 1.1 (ref 0.7–1.7)
Alpha 1: 0.2 g/dL (ref 0.0–0.4)
Alpha2 Glob SerPl Elph-Mcnc: 0.8 g/dL (ref 0.4–1.0)
B-Globulin SerPl Elph-Mcnc: 1.1 g/dL (ref 0.7–1.3)
Gamma Glob SerPl Elph-Mcnc: 1.2 g/dL (ref 0.4–1.8)
Globulin, Total: 3.3 g/dL (ref 2.2–3.9)
IgA/Immunoglobulin A, Serum: 259 mg/dL (ref 61–437)
IgG (Immunoglobin G), Serum: 1203 mg/dL (ref 603–1613)
IgM (Immunoglobulin M), Srm: 90 mg/dL (ref 15–143)

## 2022-05-28 LAB — SEDIMENTATION RATE: Sed Rate: 13 mm/hr (ref 0–30)

## 2022-05-28 LAB — ANA W/REFLEX IF POSITIVE: Anti Nuclear Antibody (ANA): NEGATIVE

## 2022-05-28 LAB — VITAMIN B12: Vitamin B-12: 292 pg/mL (ref 232–1245)

## 2022-05-28 LAB — C-REACTIVE PROTEIN: CRP: 3 mg/L (ref 0–10)

## 2022-05-28 LAB — RPR: RPR Ser Ql: NONREACTIVE

## 2022-05-28 LAB — HGB A1C W/O EAG: Hgb A1c MFr Bld: 5.5 % (ref 4.8–5.6)

## 2022-05-28 LAB — TSH: TSH: 1.45 u[IU]/mL (ref 0.450–4.500)

## 2022-05-28 LAB — VITAMIN D 25 HYDROXY (VIT D DEFICIENCY, FRACTURES): Vit D, 25-Hydroxy: 34.6 ng/mL (ref 30.0–100.0)

## 2022-05-28 LAB — CK: Total CK: 86 U/L (ref 41–331)

## 2022-05-28 NOTE — Therapy (Signed)
OUTPATIENT PHYSICAL THERAPY THORACOLUMBAR EVALUATION   Patient Name: Jason Johnson MRN: [id number] DOB:06-15-1940, 82 y.o., male Today's Date: 05/28/2022  END OF SESSION:  PT End of Session - 05/28/22 0817     Visit Number 1    Number of Visits 12    Date for PT Re-Evaluation 07/09/22    Authorization Type [employer] Medicare (no VL, no auth)    Progress Note Due on Visit 10    PT Start Time 0817    PT Stop Time 0902    PT Time Calculation (min) 45 min    Activity Tolerance Patient tolerated treatment well    Behavior During Therapy [facility] for tasks assessed/performed             Past Medical History:  Diagnosis Date   Carotid artery disease ([place])    Coronary artery disease    history of coronary artery bypass grafting in 1999   Gout    Hyperlipidemia    Hypertension    Myocardial infarction [facility]) 2009   Past Surgical History:  Procedure Laterality Date   CARDIAC CATHETERIZATION  2009   CATARACT EXTRACTION W/PHACO Right 09/09/2012   Procedure: CATARACT EXTRACTION PHACO AND INTRAOCULAR LENS PLACEMENT ([place]);  Surgeon: Tonny Branch, MD;  Location: [facility];  Service: Ophthalmology;  Laterality: Right;  CDE 14.48   CATARACT EXTRACTION W/PHACO Left 10/07/2012   Procedure: CATARACT EXTRACTION PHACO AND INTRAOCULAR LENS PLACEMENT ([facility]);  Surgeon: Tonny Branch, MD;  Location: [facility];  Service: Ophthalmology;  Laterality: Left;  CDE:12.24   COLONOSCOPY     COLONOSCOPY N/A 02/11/2017   Procedure: COLONOSCOPY;  Surgeon: Daneil Dolin, MD;  Location: [facility];  Service: Endoscopy;  Laterality: N/A;  7:30am   CORONARY ARTERY BYPASS GRAFT  2004   triple   DOPPLER ECHOCARDIOGRAPHY  2010   NM MYOVIEW LTD  2012   TONSILLECTOMY     as a child   Patient Active Problem List   Diagnosis Date Noted   Peripheral neuropathy 05/23/2022   Lumbar spondylosis 05/23/2022   Gait abnormality 02/10/2022   Paresthesia 02/10/2022   Weakness 02/10/2022   Irregular heartbeat 05/04/2019   Diverticula  of colon 04/13/2017   Internal hemorrhoids 04/13/2017   Rectal bleeding 01/06/2017   History of arteriosclerotic cardiovascular disease 01/06/2017   Coronary artery disease 07/06/2013   Carotid artery disease ([place]) 07/06/2013   Essential hypertension 07/06/2013   Hyperlipidemia 07/06/2013    PCP: Redmond School MD  REFERRING PROVIDER: Marcial Pacas, MD  REFERRING DIAG: R26.9 (ICD-10-CM) - Gait abnormality G62.89 (ICD-10-CM) - Other polyneuropathy M47.816 (ICD-10-CM) - Lumbar spondylosis  Rationale for Evaluation and Treatment: Rehabilitation  THERAPY DIAG:  Other abnormalities of gait and mobility - Plan: PT plan of care cert/re-cert  Muscle weakness (generalized) - Plan: PT plan of care cert/re-cert  ONSET DATE: 1 year  SUBJECTIVE:  SUBJECTIVE STATEMENT: Patient states history of sciatica with PT treatment and then they did traction which helped. He has been more sedentary since the pandemic. Has not been exercising right. In the last year he has been noticing issues with his legs from hips to knees. They are not cooperating. Weakness L>R. Went to PCP who referred him to neurology. Had MRIs done on neck and back. Has trouble walking longer distances due to endurance and balance difficulties. Has heart issues with bypass years ago. Can walk for about 10 -15 minutes before fatigue. Aching in both glutes and knees.  PERTINENT HISTORY:  CAD, HTN, HLD, Hx MI  PAIN:  Are you having pain? No  PRECAUTIONS: None  WEIGHT BEARING RESTRICTIONS: No  FALLS:  Has patient fallen in last 6 months? No  OCCUPATION: Retired  PLOF: Independent  PATIENT GOALS: move better, walk, exercise  NEXT MD VISIT: none scheduled  OBJECTIVE:   DIAGNOSTIC FINDINGS:  MRI 04/10/22: IMPRESSION: This MRI of the lumbar spine  without contrast shows the following: At L2-L3, there is severe loss of disc height associated with mild retrolisthesis.  There is moderately severe right lateral recess stenosis with impingement of the right L3 nerve root.  Compared to the 2010 MRI, this level has slightly progressed. At L3-L4, there are degenerative changes causing moderately severe left lateral recess stenosis that could impinge upon the left L4 nerve root.  Degenerative changes at this level have mildly progressed compared to the 2010 MRI. At L4-L5, there is a right paramedian disc protrusion and other degenerative change causing mild spinal stenosis and moderately severe left lateral recess stenosis which could impinge upon the left L5 nerve root.  Degenerative changes have mildly progressed at this level At L5-S1, there is very minimal retrolisthesis and other degenerative change but no spinal stenosis or nerve root compression.  Degenerative changes at this level have improved compared to the 2010 MRI with resolution of the right S1 nerve root impingement    SCREENING FOR RED FLAGS: Bowel or bladder incontinence: No Spinal tumors: No Cauda equina syndrome: No Compression fracture: No Abdominal aneurysm: No  COGNITION: Overall cognitive status: Within functional limits for tasks assessed     SENSATION:WFL    POSTURE: decreased lumbar lordosis  PALPATION: Minimal soreness in lumbar paraspinals, not tenderness in glutes bilaterally  LUMBAR ROM:   AROM eval  Flexion 50% limited  Extension 50% limited  Right lateral flexion 50% limited  Left lateral flexion 50% limited  Right rotation   Left rotation    (Blank rows = not tested)  LOWER EXTREMITY ROM:   Lacks TKE on L   Active  Right eval Left eval  Hip flexion    Hip extension    Hip abduction    Hip adduction    Hip internal rotation    Hip external rotation    Knee flexion    Knee extension    Ankle dorsiflexion    Ankle plantarflexion     Ankle inversion    Ankle eversion     (Blank rows = not tested)  LOWER EXTREMITY MMT:    MMT Right eval Left eval  Hip flexion 4+ 4  Hip extension 4- 3+  Hip abduction 3+ 3+  Hip adduction    Hip internal rotation    Hip external rotation    Knee flexion 5 4+  Knee extension 5 4+  Ankle dorsiflexion 5 4+  Ankle plantarflexion    Ankle inversion    Ankle eversion     (  Blank rows = not tested)   FUNCTIONAL TESTS:  5 times sit to stand: 16.73 seconds without UE support 2 minute walk test: 275  GAIT: Distance walked: 275 feet  Assistive device utilized: None Level of assistance: Complete Independence Comments: 2MWT, mild Trendelenburg on L, trunk slightly flexed, mild fatigue following  TODAY'S TREATMENT:                                                                                                                              DATE:  05/28/22 Seated trunk flexion stretch 10 x 5 second holds Standing hip abduction 1 x 10 bilateral  Standing hip extension 1 x 10 bilateral    PATIENT EDUCATION:  Education details: Patient educated on exam findings, POC, scope of PT, HEP, and general exercise. Person educated: Patient Education method: Explanation, Demonstration, and Handouts Education comprehension: verbalized understanding, returned demonstration, verbal cues required, and tactile cues required  HOME EXERCISE PROGRAM: Access Code: [email or web address] URL: [email or web address]  Date: 05/28/2022 - Seated Flexion Stretch  - 2-3 x daily - 7 x weekly - 10 reps - 5 second hold - Standing Hip Abduction with Counter Support  - 2-3 x daily - 7 x weekly - 2 sets - 10 reps - Standing Hip Extension with Counter Support  - 2-3 x daily - 7 x weekly - 3 sets - 10 reps  ASSESSMENT:  CLINICAL IMPRESSION: Patient a 82 y.o. y.o. male who was seen today for physical therapy evaluation and treatment for gait abnormality, polyneuropathy, and lumbar spondylosis. Patient presents  with pain limited deficits in lumbar spine and LE strength, ROM, endurance, activity tolerance, gait, balance, and functional mobility with ADL. Patient is having to modify and restrict ADL as indicated by outcome measure score as well as subjective information and objective measures which is affecting overall participation. Patient will benefit from skilled physical therapy in order to improve function and reduce impairment.  OBJECTIVE IMPAIRMENTS: Abnormal gait, decreased activity tolerance, decreased balance, decreased endurance, decreased mobility, difficulty walking, decreased ROM, decreased strength, improper body mechanics, postural dysfunction, and pain.   ACTIVITY LIMITATIONS: carrying, lifting, bending, standing, squatting, stairs, transfers, locomotion level, and caring for others  PARTICIPATION LIMITATIONS: meal prep, cleaning, laundry, shopping, community activity, and yard work  PERSONAL FACTORS: Age, Time since onset of injury/illness/exacerbation, and 3+ comorbidities: CAD, HTN, HLD, Hx MI, lumbar spondylosis  are also affecting patient's functional outcome.   REHAB POTENTIAL: Good  CLINICAL DECISION MAKING: Stable/uncomplicated  EVALUATION COMPLEXITY: Low   GOALS: Goals reviewed with patient? Yes  SHORT TERM GOALS: Target date: 06/18/2022    Patient will be independent with HEP in order to improve functional outcomes. Baseline:  Goal status: INITIAL  2.  Patient will report at least 25% improvement in symptoms for improved quality of life. Baseline:  Goal status: INITIAL   LONG TERM GOALS: Target Date: 07/09/2022    Patient will report at least 75% improvement in symptoms for improved quality of life.  Baseline:  Goal status: INITIAL  2.  Patient will be able to complete 5x STS in under 11.4 seconds in order to reduce the risk of falls. Baseline: 16.73 seconds without UE support Goal status: INITIAL  3.  Patient will demonstrate at least 25% improvement in  lumbar ROM in all restricted planes for improved ability to move trunk while completing chores. Baseline: see above Goal status: INITIAL  4.  Patient will be able to ambulate at least 400 feet in 2MWT in order to demonstrate improved tolerance to activity. Baseline: 275 feet Goal status: INITIAL  5.  Patient will demonstrate grade of 5/5 MMT grade in all tested musculature as evidence of improved strength to assist with stair ambulation and gait.   Baseline: see above Goal status: INITIAL   PLAN:  PT FREQUENCY: 2x/week  PT DURATION: 6 weeks  PLANNED INTERVENTIONS: Therapeutic exercises, Therapeutic activity, Neuromuscular re-education, Balance training, Gait training, Patient/Family education, Joint manipulation, Joint mobilization, Stair training, Orthotic/Fit training, DME instructions, Aquatic Therapy, Dry Needling, Electrical stimulation, Spinal manipulation, Spinal mobilization, Cryotherapy, Moist heat, Compression bandaging, scar mobilization, Splintting, Taping, Traction, Ultrasound, Ionotophoresis 61m/ml Dexamethasone, and Manual therapy  PLAN FOR NEXT SESSION: f/u with HEP, glute and core strength, lumbar mobility, LE strength/functional strength; educate on proper exercises/gym routines/ walking program as indicated   AMearl Latin PT 05/28/2022, 9:07 AM

## 2022-06-02 ENCOUNTER — Ambulatory Visit (HOSPITAL_COMMUNITY): Payer: Medicare HMO | Admitting: Physical Therapy

## 2022-06-02 DIAGNOSIS — R2689 Other abnormalities of gait and mobility: Secondary | ICD-10-CM

## 2022-06-02 DIAGNOSIS — M6281 Muscle weakness (generalized): Secondary | ICD-10-CM

## 2022-06-02 NOTE — Therapy (Signed)
OUTPATIENT PHYSICAL THERAPY THORACOLUMBAR Treatment   Patient Name: Jason Johnson MRN: [id number] DOB:07-Dec-1940, 82 y.o., male Today's Date: 06/02/2022  END OF SESSION:  PT End of Session - 06/02/22 0901     Visit Number 2    Number of Visits 12    Date for PT Re-Evaluation 07/09/22    Authorization Type [employer] Medicare (no VL, no auth)    Progress Note Due on Visit 10    PT Start Time 0815    PT Stop Time 0900    PT Time Calculation (min) 45 min    Activity Tolerance Patient tolerated treatment well    Behavior During Therapy [facility] for tasks assessed/performed              Past Medical History:  Diagnosis Date   Carotid artery disease ([place])    Coronary artery disease    history of coronary artery bypass grafting in 1999   Gout    Hyperlipidemia    Hypertension    Myocardial infarction [facility]) 2009   Past Surgical History:  Procedure Laterality Date   CARDIAC CATHETERIZATION  2009   CATARACT EXTRACTION W/PHACO Right 09/09/2012   Procedure: CATARACT EXTRACTION PHACO AND INTRAOCULAR LENS PLACEMENT ([place]);  Surgeon: Tonny Branch, MD;  Location: [facility];  Service: Ophthalmology;  Laterality: Right;  CDE 14.48   CATARACT EXTRACTION W/PHACO Left 10/07/2012   Procedure: CATARACT EXTRACTION PHACO AND INTRAOCULAR LENS PLACEMENT ([facility]);  Surgeon: Tonny Branch, MD;  Location: [facility];  Service: Ophthalmology;  Laterality: Left;  CDE:12.24   COLONOSCOPY     COLONOSCOPY N/A 02/11/2017   Procedure: COLONOSCOPY;  Surgeon: Daneil Dolin, MD;  Location: [facility];  Service: Endoscopy;  Laterality: N/A;  7:30am   CORONARY ARTERY BYPASS GRAFT  2004   triple   DOPPLER ECHOCARDIOGRAPHY  2010   NM MYOVIEW LTD  2012   TONSILLECTOMY     as a child   Patient Active Problem List   Diagnosis Date Noted   Peripheral neuropathy 05/23/2022   Lumbar spondylosis 05/23/2022   Gait abnormality 02/10/2022   Paresthesia 02/10/2022   Weakness 02/10/2022   Irregular heartbeat 05/04/2019   Diverticula  of colon 04/13/2017   Internal hemorrhoids 04/13/2017   Rectal bleeding 01/06/2017   History of arteriosclerotic cardiovascular disease 01/06/2017   Coronary artery disease 07/06/2013   Carotid artery disease ([place]) 07/06/2013   Essential hypertension 07/06/2013   Hyperlipidemia 07/06/2013    PCP: Redmond School MD  REFERRING PROVIDER: Marcial Pacas, MD  REFERRING DIAG: R26.9 (ICD-10-CM) - Gait abnormality G62.89 (ICD-10-CM) - Other polyneuropathy M47.816 (ICD-10-CM) - Lumbar spondylosis  Rationale for Evaluation and Treatment: Rehabilitation  THERAPY DIAG:  Other abnormalities of gait and mobility  Muscle weakness (generalized)  ONSET DATE: 1 year  SUBJECTIVE:  SUBJECTIVE STATEMENT: Pt states that he is doing the exercises given to him but wonders if they are really helping as they are so easy.    PERTINENT HISTORY:  CAD, HTN, HLD, Hx MI  PAIN:  Are you having pain? Back and hips 2/10 after 4:00 walk.  PRECAUTIONS: None  WEIGHT BEARING RESTRICTIONS: No  FALLS:  Has patient fallen in last 6 months? No  OCCUPATION: Retired  PLOF: Independent  PATIENT GOALS: move better, walk, exercise  NEXT MD VISIT: none scheduled  OBJECTIVE:   DIAGNOSTIC FINDINGS:  MRI 04/10/22: IMPRESSION: This MRI of the lumbar spine without contrast shows the following: At L2-L3, there is severe loss of disc height associated with mild retrolisthesis.  There is moderately severe right lateral recess stenosis with impingement of the right L3 nerve root.  Compared to the 2010 MRI, this level has slightly progressed. At L3-L4, there are degenerative changes causing moderately severe left lateral recess stenosis that could impinge upon the left L4 nerve root.  Degenerative changes at this level have mildly progressed  compared to the 2010 MRI. At L4-L5, there is a right paramedian disc protrusion and other degenerative change causing mild spinal stenosis and moderately severe left lateral recess stenosis which could impinge upon the left L5 nerve root.  Degenerative changes have mildly progressed at this level At L5-S1, there is very minimal retrolisthesis and other degenerative change but no spinal stenosis or nerve root compression.  Degenerative changes at this level have improved compared to the 2010 MRI with resolution of the right S1 nerve root impingement     POSTURE: decreased lumbar lordosis  PALPATION: Minimal soreness in lumbar paraspinals, not tenderness in glutes bilaterally  LUMBAR ROM:   AROM eval  Flexion 50% limited  Extension 50% limited  Right lateral flexion 50% limited  Left lateral flexion 50% limited  Right rotation   Left rotation    (Blank rows = not tested)  LOWER EXTREMITY ROM:   Lacks TKE on L    LOWER EXTREMITY MMT:    MMT Right eval Left eval  Hip flexion 4+ 4  Hip extension 4- 3+  Hip abduction 3+ 3+  Hip adduction    Hip internal rotation    Hip external rotation    Knee flexion 5 4+  Knee extension 5 4+  Ankle dorsiflexion 5 4+  Ankle plantarflexion    Ankle inversion    Ankle eversion     (Blank rows = not tested)   FUNCTIONAL TESTS:  5 times sit to stand: 16.73 seconds without UE support 2 minute walk test: 275  GAIT: Distance walked: 275 feet  Assistive device utilized: None Level of assistance: Complete Independence Comments: 2MWT, mild Trendelenburg on L, trunk slightly flexed, mild fatigue following  TODAY'S TREATMENT:  DATE:  06/02/22 Treatment began with a 4:00 minute walk Hip excursion x 3 Seated piriformis stretch 1 rep x 10 seconds ( to difficult for patient at this time) Sitting tall x 5" x 3   Supine: Ab set x 10  Bridge x 5 Resisted clam with green theraband  x 10 Knee of chest 3 x 30"   05/28/22 Seated trunk flexion stretch 10 x 5 second holds Standing hip abduction 1 x 10 bilateral  Standing hip extension 1 x 10 bilateral    PATIENT EDUCATION:  Education details: Patient educated on exam findings, POC, scope of PT, HEP, and general exercise. Person educated: Patient Education method: Explanation, Demonstration, and Handouts Education comprehension: verbalized understanding, returned demonstration, verbal cues required, and tactile cues required  HOME EXERCISE PROGRAM: Access Code: [email or web address] URL: [email or web address] 06/02/2022 Access Code: [email or web address] URL: [email or web address] Date: 06/02/2022 Prepared by: Rayetta Humphrey  Exercises - Supine Transversus Abdominis Bracing - Hands on Stomach  - 2 x daily - 7 x weekly - 1 sets - 10 reps - 5" hold - Beginner Bridge  - 2 x daily - 7 x weekly - 1 sets - 10 reps - 5" hold - Hooklying Clamshell with Resistance  - 2 x daily - 7 x weekly - 1 sets - 10 reps - 5" hold - Supine Single Knee to Chest Stretch  - 1 x daily - 7 x weekly - 1 sets - 3 reps - 30" hold - Seated Upright Posture Correction  - 1 x daily - 7 x weekly - 1 sets - 3 reps - 5" hold Date: 05/28/2022 - Seated Flexion Stretch  - 2-3 x daily - 7 x weekly - 10 reps - 5 second hold - Standing Hip Abduction with Counter Support  - 2-3 x daily - 7 x weekly - 2 sets - 10 reps - Standing Hip Extension with Counter Support  - 2-3 x daily - 7 x weekly - 3 sets - 10 reps  ASSESSMENT:  CLINICAL IMPRESSION: Therapist reviewed evaluation and goals with pt.  Added walking which pt needed to rest 3 times in a 4 minute walk.  Continued with strengthening and stretching exercises. Patient presents with pain limited deficits in lumbar spine and LE strength, ROM, endurance, activity tolerance, gait, balance, and functional mobility with ADL. Patient is  having to modify and restrict ADL as indicated by outcome measure score as well as subjective information and objective measures which is affecting overall participation. Patient will continue to benefit from skilled physical therapy in order to improve function and reduce impairment.  OBJECTIVE IMPAIRMENTS: Abnormal gait, decreased activity tolerance, decreased balance, decreased endurance, decreased mobility, difficulty walking, decreased ROM, decreased strength, improper body mechanics, postural dysfunction, and pain.   ACTIVITY LIMITATIONS: carrying, lifting, bending, standing, squatting, stairs, transfers, locomotion level, and caring for others  PARTICIPATION LIMITATIONS: meal prep, cleaning, laundry, shopping, community activity, and yard work  PERSONAL FACTORS: Age, Time since onset of injury/illness/exacerbation, and 3+ comorbidities: CAD, HTN, HLD, Hx MI, lumbar spondylosis  are also affecting patient's functional outcome.   REHAB POTENTIAL: Good  CLINICAL DECISION MAKING: Stable/uncomplicated  EVALUATION COMPLEXITY: Low   GOALS: Goals reviewed with patient? Yes  SHORT TERM GOALS: Target date: 06/18/2022    Patient will be independent with HEP in order to improve functional outcomes. Baseline:  Goal status: IN PROGRESS  2.  Patient will report at least 25% improvement in symptoms for improved quality of life. Baseline:  Goal status: IN PROGRESS   LONG  TERM GOALS: Target Date: 07/09/2022    Patient will report at least 75% improvement in symptoms for improved quality of life. Baseline:  Goal status: IN PROGRESS  2.  Patient will be able to complete 5x STS in under 11.4 seconds in order to reduce the risk of falls. Baseline: 16.73 seconds without UE support Goal status: IN PROGRESS  3.  Patient will demonstrate at least 25% improvement in lumbar ROM in all restricted planes for improved ability to move trunk while completing chores. Baseline: see above Goal status:  IN PROGRESS  4.  Patient will be able to ambulate at least 400 feet in 2MWT in order to demonstrate improved tolerance to activity. Baseline: 275 feet Goal status: IN PROGRESS  5.  Patient will demonstrate grade of 5/5 MMT grade in all tested musculature as evidence of improved strength to assist with stair ambulation and gait.   Baseline: see above Goal status: IN PROGRESS   PLAN:  PT FREQUENCY: 2x/week  PT DURATION: 6 weeks  PLANNED INTERVENTIONS: Therapeutic exercises, Therapeutic activity, Neuromuscular re-education, Balance training, Gait training, Patient/Family education, Joint manipulation, Joint mobilization, Stair training, Orthotic/Fit training, DME instructions, Aquatic Therapy, Dry Needling, Electrical stimulation, Spinal manipulation, Spinal mobilization, Cryotherapy, Moist heat, Compression bandaging, scar mobilization, Splintting, Taping, Traction, Ultrasound, Ionotophoresis '4mg'$ /ml Dexamethasone, and Manual therapy  PLAN FOR NEXT SESSION: f/u with HEP, glute and core strength, lumbar mobility, LE strength/functional strength; educate on proper exercises/gym routines/ walking program as indicated  Rayetta Humphrey, PT [place]  [place] EVALUATION   Patient Name: Jason Johnson MRN: [id number] DOB:1940-09-24, 82 y.o., male Today's Date: 06/02/2022  END OF SESSION:  PT End of Session - 06/02/22 0901     Visit Number 2    Number of Visits 12    Date for PT Re-Evaluation 07/09/22    Authorization Type [employer] Medicare (no VL, no auth)    Progress Note Due on Visit 10    PT Start Time 0815    PT Stop Time 0900    PT Time Calculation (min) 45 min    Activity Tolerance Patient tolerated treatment well    Behavior During Therapy [facility] for tasks assessed/performed             Past Medical History:  Diagnosis Date   Carotid artery disease ([place])    Coronary artery disease    history of coronary artery bypass grafting  in 1999   Gout    Hyperlipidemia    Hypertension    Myocardial infarction [facility]) 2009   Past Surgical History:  Procedure Laterality Date   CARDIAC CATHETERIZATION  2009   CATARACT EXTRACTION W/PHACO Right 09/09/2012   Procedure: CATARACT EXTRACTION PHACO AND INTRAOCULAR LENS PLACEMENT ([facility]);  Surgeon: Tonny Branch, MD;  Location: [facility];  Service: Ophthalmology;  Laterality: Right;  CDE 14.48   CATARACT EXTRACTION W/PHACO Left 10/07/2012   Procedure: CATARACT EXTRACTION PHACO AND INTRAOCULAR LENS PLACEMENT ([facility]);  Surgeon: Tonny Branch, MD;  Location: [facility];  Service: Ophthalmology;  Laterality: Left;  CDE:12.24   COLONOSCOPY     COLONOSCOPY N/A 02/11/2017   Procedure: COLONOSCOPY;  Surgeon: Daneil Dolin, MD;  Location: [facility];  Service: Endoscopy;  Laterality: N/A;  7:30am   CORONARY ARTERY BYPASS GRAFT  2004   triple   DOPPLER ECHOCARDIOGRAPHY  2010   NM MYOVIEW LTD  2012   TONSILLECTOMY     as a child   Patient Active Problem List   Diagnosis Date Noted  Peripheral neuropathy 05/23/2022   Lumbar spondylosis 05/23/2022   Gait abnormality 02/10/2022   Paresthesia 02/10/2022   Weakness 02/10/2022   Irregular heartbeat 05/04/2019   Diverticula of colon 04/13/2017   Internal hemorrhoids 04/13/2017   Rectal bleeding 01/06/2017   History of arteriosclerotic cardiovascular disease 01/06/2017   Coronary artery disease 07/06/2013   Carotid artery disease ([place]) 07/06/2013   Essential hypertension 07/06/2013   Hyperlipidemia 07/06/2013    PCP: Redmond School MD  REFERRING PROVIDER: Marcial Pacas, MD  REFERRING DIAG: R26.9 (ICD-10-CM) - Gait abnormality G62.89 (ICD-10-CM) - Other polyneuropathy M47.816 (ICD-10-CM) - Lumbar spondylosis  Rationale for Evaluation and Treatment: Rehabilitation  THERAPY DIAG:  Other abnormalities of gait and mobility  Muscle weakness (generalized)  ONSET DATE: 1 year  SUBJECTIVE:                                                                                                                                                                                            SUBJECTIVE STATEMENT: Patient states history of sciatica with PT treatment and then they did traction which helped. He has been more sedentary since the pandemic. Has not been exercising right. In the last year he has been noticing issues with his legs from hips to knees. They are not cooperating. Weakness L>R. Went to PCP who referred him to neurology. Had MRIs done on neck and back. Has trouble walking longer distances due to endurance and balance difficulties. Has heart issues with bypass years ago. Can walk for about 10 -15 minutes before fatigue. Aching in both glutes and knees.  PERTINENT HISTORY:  CAD, HTN, HLD, Hx MI  PAIN:  Are you having pain? No  PRECAUTIONS: None  WEIGHT BEARING RESTRICTIONS: No  FALLS:  Has patient fallen in last 6 months? No  OCCUPATION: Retired  PLOF: Independent  PATIENT GOALS: move better, walk, exercise  NEXT MD VISIT: none scheduled  OBJECTIVE:   DIAGNOSTIC FINDINGS:  MRI 04/10/22: IMPRESSION: This MRI of the lumbar spine without contrast shows the following: At L2-L3, there is severe loss of disc height associated with mild retrolisthesis.  There is moderately severe right lateral recess stenosis with impingement of the right L3 nerve root.  Compared to the 2010 MRI, this level has slightly progressed. At L3-L4, there are degenerative changes causing moderately severe left lateral recess stenosis that could impinge upon the left L4 nerve root.  Degenerative changes at this level have mildly progressed compared to the 2010 MRI. At L4-L5, there is a right paramedian disc protrusion and other degenerative change causing mild spinal stenosis and moderately severe left lateral recess stenosis which could impinge upon the  left L5 nerve root.  Degenerative changes have mildly progressed at this level At L5-S1, there is very minimal  retrolisthesis and other degenerative change but no spinal stenosis or nerve root compression.  Degenerative changes at this level have improved compared to the 2010 MRI with resolution of the right S1 nerve root impingement    SCREENING FOR RED FLAGS: Bowel or bladder incontinence: No Spinal tumors: No Cauda equina syndrome: No Compression fracture: No Abdominal aneurysm: No  COGNITION: Overall cognitive status: Within functional limits for tasks assessed     SENSATION:WFL    POSTURE: decreased lumbar lordosis  PALPATION: Minimal soreness in lumbar paraspinals, not tenderness in glutes bilaterally  LUMBAR ROM:   AROM eval  Flexion 50% limited  Extension 50% limited  Right lateral flexion 50% limited  Left lateral flexion 50% limited  Right rotation   Left rotation    (Blank rows = not tested)  LOWER EXTREMITY ROM:   Lacks TKE on L   Active  Right eval Left eval  Hip flexion    Hip extension    Hip abduction    Hip adduction    Hip internal rotation    Hip external rotation    Knee flexion    Knee extension    Ankle dorsiflexion    Ankle plantarflexion    Ankle inversion    Ankle eversion     (Blank rows = not tested)  LOWER EXTREMITY MMT:    MMT Right eval Left eval  Hip flexion 4+ 4  Hip extension 4- 3+  Hip abduction 3+ 3+  Hip adduction    Hip internal rotation    Hip external rotation    Knee flexion 5 4+  Knee extension 5 4+  Ankle dorsiflexion 5 4+  Ankle plantarflexion    Ankle inversion    Ankle eversion     (Blank rows = not tested)   FUNCTIONAL TESTS:  5 times sit to stand: 16.73 seconds without UE support 2 minute walk test: 275  GAIT: Distance walked: 275 feet  Assistive device utilized: None Level of assistance: Complete Independence Comments: 2MWT, mild Trendelenburg on L, trunk slightly flexed, mild fatigue following  TODAY'S TREATMENT:                                                                                                                               DATE:  05/28/22 Seated trunk flexion stretch 10 x 5 second holds Standing hip abduction 1 x 10 bilateral  Standing hip extension 1 x 10 bilateral    PATIENT EDUCATION:  Education details: Patient educated on exam findings, POC, scope of PT, HEP, and general exercise. Person educated: Patient Education method: Explanation, Demonstration, and Handouts Education comprehension: verbalized understanding, returned demonstration, verbal cues required, and tactile cues required  HOME EXERCISE PROGRAM: Access Code: [email or web address] URL: [email or web address]  Date: 05/28/2022 - Seated Flexion Stretch  - 2-3 x daily - 7 x weekly - 10 reps -  5 second hold - Standing Hip Abduction with Counter Support  - 2-3 x daily - 7 x weekly - 2 sets - 10 reps - Standing Hip Extension with Counter Support  - 2-3 x daily - 7 x weekly - 3 sets - 10 reps  ASSESSMENT:  CLINICAL IMPRESSION: Patient a 82 y.o. y.o. male who was seen today for physical therapy evaluation and treatment for gait abnormality, polyneuropathy, and lumbar spondylosis. Patient presents with pain limited deficits in lumbar spine and LE strength, ROM, endurance, activity tolerance, gait, balance, and functional mobility with ADL. Patient is having to modify and restrict ADL as indicated by outcome measure score as well as subjective information and objective measures which is affecting overall participation. Patient will benefit from skilled physical therapy in order to improve function and reduce impairment.  OBJECTIVE IMPAIRMENTS: Abnormal gait, decreased activity tolerance, decreased balance, decreased endurance, decreased mobility, difficulty walking, decreased ROM, decreased strength, improper body mechanics, postural dysfunction, and pain.   ACTIVITY LIMITATIONS: carrying, lifting, bending, standing, squatting, stairs, transfers, locomotion level, and caring for others  PARTICIPATION  LIMITATIONS: meal prep, cleaning, laundry, shopping, community activity, and yard work  PERSONAL FACTORS: Age, Time since onset of injury/illness/exacerbation, and 3+ comorbidities: CAD, HTN, HLD, Hx MI, lumbar spondylosis  are also affecting patient's functional outcome.   REHAB POTENTIAL: Good  CLINICAL DECISION MAKING: Stable/uncomplicated  EVALUATION COMPLEXITY: Low   GOALS: Goals reviewed with patient? Yes  SHORT TERM GOALS: Target date: 06/18/2022    Patient will be independent with HEP in order to improve functional outcomes. Baseline:  Goal status: INITIAL  2.  Patient will report at least 25% improvement in symptoms for improved quality of life. Baseline:  Goal status: INITIAL   LONG TERM GOALS: Target Date: 07/09/2022    Patient will report at least 75% improvement in symptoms for improved quality of life. Baseline:  Goal status: INITIAL  2.  Patient will be able to complete 5x STS in under 11.4 seconds in order to reduce the risk of falls. Baseline: 16.73 seconds without UE support Goal status: INITIAL  3.  Patient will demonstrate at least 25% improvement in lumbar ROM in all restricted planes for improved ability to move trunk while completing chores. Baseline: see above Goal status: INITIAL  4.  Patient will be able to ambulate at least 400 feet in 2MWT in order to demonstrate improved tolerance to activity. Baseline: 275 feet Goal status: INITIAL  5.  Patient will demonstrate grade of 5/5 MMT grade in all tested musculature as evidence of improved strength to assist with stair ambulation and gait.   Baseline: see above Goal status: INITIAL   PLAN:  PT FREQUENCY: 2x/week  PT DURATION: 6 weeks  PLANNED INTERVENTIONS: Therapeutic exercises, Therapeutic activity, Neuromuscular re-education, Balance training, Gait training, Patient/Family education, Joint manipulation, Joint mobilization, Stair training, Orthotic/Fit training, DME instructions, Aquatic  Therapy, Dry Needling, Electrical stimulation, Spinal manipulation, Spinal mobilization, Cryotherapy, Moist heat, Compression bandaging, scar mobilization, Splintting, Taping, Traction, Ultrasound, Ionotophoresis '4mg'$ /ml Dexamethasone, and Manual therapy  PLAN FOR NEXT SESSION: f/u with HEP, glute and core strength, lumbar mobility, LE strength/functional strength; educate on proper exercises/gym routines/ walking program as indicated   Hephzibah Strehle,CINDY, PT 06/02/2022, 9:01 AM

## 2022-06-04 ENCOUNTER — Ambulatory Visit (HOSPITAL_COMMUNITY): Payer: Medicare HMO | Admitting: Physical Therapy

## 2022-06-04 ENCOUNTER — Encounter (HOSPITAL_COMMUNITY): Payer: Medicare HMO

## 2022-06-04 DIAGNOSIS — R2689 Other abnormalities of gait and mobility: Secondary | ICD-10-CM | POA: Diagnosis not present

## 2022-06-04 DIAGNOSIS — M6281 Muscle weakness (generalized): Secondary | ICD-10-CM

## 2022-06-04 NOTE — Therapy (Signed)
OUTPATIENT PHYSICAL THERAPY THORACOLUMBAR Treatment   Patient Name: Jason Johnson MRN: [id number] DOB:[birth year], 82 y.o., male Today's Date: 06/04/2022  END OF SESSION:  PT End of Session - 06/04/22 0948     Visit Number 3    Number of Visits 12    Date for PT Re-Evaluation 07/09/22    Authorization Type [employer] Medicare (no VL, no auth)    Progress Note Due on Visit 10    PT Start Time 0945    PT Stop Time 1030    PT Time Calculation (min) 45 min    Activity Tolerance Patient tolerated treatment well    Behavior During Therapy WFL for tasks assessed/performed              Past Medical History:  Diagnosis Date   Carotid artery disease ([place])    Coronary artery disease    history of coronary artery bypass grafting in 1999   Gout    Hyperlipidemia    Hypertension    Myocardial infarction [facility]) 2009   Past Surgical History:  Procedure Laterality Date   CARDIAC CATHETERIZATION  2009   CATARACT EXTRACTION W/PHACO Right 09/09/2012   Procedure: CATARACT EXTRACTION PHACO AND INTRAOCULAR LENS PLACEMENT ([place]);  Surgeon: Tonny Branch, MD;  Location: [facility];  Service: Ophthalmology;  Laterality: Right;  CDE 14.48   CATARACT EXTRACTION W/PHACO Left 10/07/2012   Procedure: CATARACT EXTRACTION PHACO AND INTRAOCULAR LENS PLACEMENT ([facility]);  Surgeon: Tonny Branch, MD;  Location: [facility];  Service: Ophthalmology;  Laterality: Left;  CDE:12.24   COLONOSCOPY     COLONOSCOPY N/A 02/11/2017   Procedure: COLONOSCOPY;  Surgeon: Daneil Dolin, MD;  Location: [facility];  Service: Endoscopy;  Laterality: N/A;  7:30am   CORONARY ARTERY BYPASS GRAFT  2004   triple   DOPPLER ECHOCARDIOGRAPHY  2010   NM MYOVIEW LTD  2012   TONSILLECTOMY     as a child   Patient Active Problem List   Diagnosis Date Noted   Peripheral neuropathy 05/23/2022   Lumbar spondylosis 05/23/2022   Gait abnormality 02/10/2022   Paresthesia 02/10/2022   Weakness 02/10/2022   Irregular heartbeat 05/04/2019   Diverticula  of colon 04/13/2017   Internal hemorrhoids 04/13/2017   Rectal bleeding 01/06/2017   History of arteriosclerotic cardiovascular disease 01/06/2017   Coronary artery disease 07/06/2013   Carotid artery disease ([place]) 07/06/2013   Essential hypertension 07/06/2013   Hyperlipidemia 07/06/2013    PCP: Redmond School MD  REFERRING PROVIDER: Marcial Pacas, MD  REFERRING DIAG: R26.9 (ICD-10-CM) - Gait abnormality G62.89 (ICD-10-CM) - Other polyneuropathy M47.816 (ICD-10-CM) - Lumbar spondylosis  Rationale for Evaluation and Treatment: Rehabilitation  THERAPY DIAG:  Other abnormalities of gait and mobility  Muscle weakness (generalized)  ONSET DATE: 1 year  SUBJECTIVE:  SUBJECTIVE STATEMENT: Pt states that he is doing the exercises given to him but wonders if they are really helping as they are so easy.    PERTINENT HISTORY:  CAD, HTN, HLD, Hx MI  PAIN:  Are you having pain? Back and hips 2/10 after 4:00 walk.  PRECAUTIONS: None  WEIGHT BEARING RESTRICTIONS: No  FALLS:  Has patient fallen in last 6 months? No  OCCUPATION: Retired  PLOF: Independent  PATIENT GOALS: move better, walk, exercise  NEXT MD VISIT: none scheduled  OBJECTIVE:   DIAGNOSTIC FINDINGS:  MRI 04/10/22: IMPRESSION: This MRI of the lumbar spine without contrast shows the following: At L2-L3, there is severe loss of disc height associated with mild retrolisthesis.  There is moderately severe right lateral recess stenosis with impingement of the right L3 nerve root.  Compared to the 2010 MRI, this level has slightly progressed. At L3-L4, there are degenerative changes causing moderately severe left lateral recess stenosis that could impinge upon the left L4 nerve root.  Degenerative changes at this level have mildly progressed  compared to the 2010 MRI. At L4-L5, there is a right paramedian disc protrusion and other degenerative change causing mild spinal stenosis and moderately severe left lateral recess stenosis which could impinge upon the left L5 nerve root.  Degenerative changes have mildly progressed at this level At L5-S1, there is very minimal retrolisthesis and other degenerative change but no spinal stenosis or nerve root compression.  Degenerative changes at this level have improved compared to the 2010 MRI with resolution of the right S1 nerve root impingement     POSTURE: decreased lumbar lordosis  PALPATION: Minimal soreness in lumbar paraspinals, not tenderness in glutes bilaterally  LUMBAR ROM:   AROM eval  Flexion 50% limited  Extension 50% limited  Right lateral flexion 50% limited  Left lateral flexion 50% limited  Right rotation   Left rotation    (Blank rows = not tested)  LOWER EXTREMITY ROM:   Lacks TKE on L    LOWER EXTREMITY MMT:    MMT Right eval Left eval  Hip flexion 4+ 4  Hip extension 4- 3+  Hip abduction 3+ 3+  Hip adduction    Hip internal rotation    Hip external rotation    Knee flexion 5 4+  Knee extension 5 4+  Ankle dorsiflexion 5 4+  Ankle plantarflexion    Ankle inversion    Ankle eversion     (Blank rows = not tested)   FUNCTIONAL TESTS:  5 times sit to stand: 16.73 seconds without UE support 2 minute walk test: 275  GAIT: Distance walked: 275 feet  Assistive device utilized: None Level of assistance: Complete Independence Comments: 2MWT, mild Trendelenburg on L, trunk slightly flexed, mild fatigue following  TODAY'S TREATMENT:  DATE:  06/02/22 Treatment began with a 4:00 minute walk Hip excursion x 3 Seated piriformis stretch 1 rep x 10 seconds ( to difficult for patient at this time) Sitting tall x 5" x 3   Supine: Ab set x 10  Bridge x 5 Resisted clam with green theraband  x 10 Knee of chest 3 x 30"   05/28/22 Seated trunk flexion stretch 10 x 5 second holds Standing hip abduction 1 x 10 bilateral  Standing hip extension 1 x 10 bilateral    PATIENT EDUCATION:  Education details: Patient educated on exam findings, POC, scope of PT, HEP, and general exercise. Person educated: Patient Education method: Explanation, Demonstration, and Handouts Education comprehension: verbalized understanding, returned demonstration, verbal cues required, and tactile cues required  HOME EXERCISE PROGRAM: Access Code: [email or web address] URL: [email or web address] 06/02/2022 Access Code: [email or web address] URL: [email or web address] Date: 06/02/2022 Prepared by: Rayetta Humphrey  Exercises - Supine Transversus Abdominis Bracing - Hands on Stomach  - 2 x daily - 7 x weekly - 1 sets - 10 reps - 5" hold - Beginner Bridge  - 2 x daily - 7 x weekly - 1 sets - 10 reps - 5" hold - Hooklying Clamshell with Resistance  - 2 x daily - 7 x weekly - 1 sets - 10 reps - 5" hold - Supine Single Knee to Chest Stretch  - 1 x daily - 7 x weekly - 1 sets - 3 reps - 30" hold - Seated Upright Posture Correction  - 1 x daily - 7 x weekly - 1 sets - 3 reps - 5" hold Date: 05/28/2022 - Seated Flexion Stretch  - 2-3 x daily - 7 x weekly - 10 reps - 5 second hold - Standing Hip Abduction with Counter Support  - 2-3 x daily - 7 x weekly - 2 sets - 10 reps - Standing Hip Extension with Counter Support  - 2-3 x daily - 7 x weekly - 3 sets - 10 reps  ASSESSMENT:  CLINICAL IMPRESSION: Therapist reviewed evaluation and goals with pt.  Added walking which pt needed to rest 3 times in a 4 minute walk.  Continued with strengthening and stretching exercises. Patient presents with pain limited deficits in lumbar spine and LE strength, ROM, endurance, activity tolerance, gait, balance, and functional mobility with ADL. Patient is  having to modify and restrict ADL as indicated by outcome measure score as well as subjective information and objective measures which is affecting overall participation. Patient will continue to benefit from skilled physical therapy in order to improve function and reduce impairment.  OBJECTIVE IMPAIRMENTS: Abnormal gait, decreased activity tolerance, decreased balance, decreased endurance, decreased mobility, difficulty walking, decreased ROM, decreased strength, improper body mechanics, postural dysfunction, and pain.   ACTIVITY LIMITATIONS: carrying, lifting, bending, standing, squatting, stairs, transfers, locomotion level, and caring for others  PARTICIPATION LIMITATIONS: meal prep, cleaning, laundry, shopping, community activity, and yard work  PERSONAL FACTORS: Age, Time since onset of injury/illness/exacerbation, and 3+ comorbidities: CAD, HTN, HLD, Hx MI, lumbar spondylosis  are also affecting patient's functional outcome.   REHAB POTENTIAL: Good  CLINICAL DECISION MAKING: Stable/uncomplicated  EVALUATION COMPLEXITY: Low   GOALS: Goals reviewed with patient? Yes  SHORT TERM GOALS: Target date: 06/18/2022    Patient will be independent with HEP in order to improve functional outcomes. Baseline:  Goal status: IN PROGRESS  2.  Patient will report at least 25% improvement in symptoms for improved quality of life. Baseline:  Goal status: IN PROGRESS   LONG  TERM GOALS: Target Date: 07/09/2022    Patient will report at least 75% improvement in symptoms for improved quality of life. Baseline:  Goal status: IN PROGRESS  2.  Patient will be able to complete 5x STS in under 11.4 seconds in order to reduce the risk of falls. Baseline: 16.73 seconds without UE support Goal status: IN PROGRESS  3.  Patient will demonstrate at least 25% improvement in lumbar ROM in all restricted planes for improved ability to move trunk while completing chores. Baseline: see above Goal status:  IN PROGRESS  4.  Patient will be able to ambulate at least 400 feet in 2MWT in order to demonstrate improved tolerance to activity. Baseline: 275 feet Goal status: IN PROGRESS  5.  Patient will demonstrate grade of 5/5 MMT grade in all tested musculature as evidence of improved strength to assist with stair ambulation and gait.   Baseline: see above Goal status: IN PROGRESS   PLAN:  PT FREQUENCY: 2x/week  PT DURATION: 6 weeks  PLANNED INTERVENTIONS: Therapeutic exercises, Therapeutic activity, Neuromuscular re-education, Balance training, Gait training, Patient/Family education, Joint manipulation, Joint mobilization, Stair training, Orthotic/Fit training, DME instructions, Aquatic Therapy, Dry Needling, Electrical stimulation, Spinal manipulation, Spinal mobilization, Cryotherapy, Moist heat, Compression bandaging, scar mobilization, Splintting, Taping, Traction, Ultrasound, Ionotophoresis '4mg'$ /ml Dexamethasone, and Manual therapy  PLAN FOR NEXT SESSION: f/u with HEP, glute and core strength, lumbar mobility, LE strength/functional strength; educate on proper exercises/gym routines/ walking program as indicated  Rayetta Humphrey, PT [place]  [place] treatment   Patient Name: Jason Johnson MRN: [id number] DOB:1940-06-15, 82 y.o., male Today's Date: 06/04/2022  END OF SESSION:  PT End of Session - 06/04/22 1030    Visit Number 3    Number of Visits 12    Date for PT Re-Evaluation 07/09/22    Authorization Type [employer] Medicare (no VL, no auth)    Progress Note Due on Visit 10    PT Start Time 0945    PT Stop Time 1030    PT Time Calculation (min) 45 min    Activity Tolerance Patient tolerated treatment well    Behavior During Therapy WFL for tasks assessed/performed             Past Medical History:  Diagnosis Date   Carotid artery disease ([place])    Coronary artery disease    history of coronary artery bypass grafting in  1999   Gout    Hyperlipidemia    Hypertension    Myocardial infarction [facility]) 2009   Past Surgical History:  Procedure Laterality Date   CARDIAC CATHETERIZATION  2009   CATARACT EXTRACTION W/PHACO Right 09/09/2012   Procedure: CATARACT EXTRACTION PHACO AND INTRAOCULAR LENS PLACEMENT ([facility]);  Surgeon: Tonny Branch, MD;  Location: [facility];  Service: Ophthalmology;  Laterality: Right;  CDE 14.48   CATARACT EXTRACTION W/PHACO Left 10/07/2012   Procedure: CATARACT EXTRACTION PHACO AND INTRAOCULAR LENS PLACEMENT ([facility]);  Surgeon: Tonny Branch, MD;  Location: [facility];  Service: Ophthalmology;  Laterality: Left;  CDE:12.24   COLONOSCOPY     COLONOSCOPY N/A 02/11/2017   Procedure: COLONOSCOPY;  Surgeon: Daneil Dolin, MD;  Location: [facility];  Service: Endoscopy;  Laterality: N/A;  7:30am   CORONARY ARTERY BYPASS GRAFT  2004   triple   DOPPLER ECHOCARDIOGRAPHY  2010   NM MYOVIEW LTD  2012   TONSILLECTOMY     as a child   Patient Active Problem List   Diagnosis Date Noted  Peripheral neuropathy 05/23/2022   Lumbar spondylosis 05/23/2022   Gait abnormality 02/10/2022   Paresthesia 02/10/2022   Weakness 02/10/2022   Irregular heartbeat 05/04/2019   Diverticula of colon 04/13/2017   Internal hemorrhoids 04/13/2017   Rectal bleeding 01/06/2017   History of arteriosclerotic cardiovascular disease 01/06/2017   Coronary artery disease 07/06/2013   Carotid artery disease ([place]) 07/06/2013   Essential hypertension 07/06/2013   Hyperlipidemia 07/06/2013    PCP: Redmond School MD  REFERRING PROVIDER: Marcial Pacas, MD  REFERRING DIAG: R26.9 (ICD-10-CM) - Gait abnormality G62.89 (ICD-10-CM) - Other polyneuropathy M47.816 (ICD-10-CM) - Lumbar spondylosis  Rationale for Evaluation and Treatment: Rehabilitation  THERAPY DIAG:  Other abnormalities of gait and mobility  Muscle weakness (generalized)  ONSET DATE: 1 year                                                                                                                                                                                             SUBJECTIVE STATEMENT: Pt states when he gets through the 4 minute walk he is hurting and is tired.   PERTINENT HISTORY:  CAD, HTN, HLD, Hx MI  PAIN:  Are you having pain? No  PRECAUTIONS: None  WEIGHT BEARING RESTRICTIONS: No  FALLS:  Has patient fallen in last 6 months? No  OCCUPATION: Retired  PLOF: Independent  PATIENT GOALS: move better, walk, exercise  NEXT MD VISIT: none scheduled  OBJECTIVE:   DIAGNOSTIC FINDINGS:  MRI 04/10/22: IMPRESSION: This MRI of the lumbar spine without contrast shows the following: At L2-L3, there is severe loss of disc height associated with mild retrolisthesis.  There is moderately severe right lateral recess stenosis with impingement of the right L3 nerve root.  Compared to the 2010 MRI, this level has slightly progressed. At L3-L4, there are degenerative changes causing moderately severe left lateral recess stenosis that could impinge upon the left L4 nerve root.  Degenerative changes at this level have mildly progressed compared to the 2010 MRI. At L4-L5, there is a right paramedian disc protrusion and other degenerative change causing mild spinal stenosis and moderately severe left lateral recess stenosis which could impinge upon the left L5 nerve root.  Degenerative changes have mildly progressed at this level At L5-S1, there is very minimal retrolisthesis and other degenerative change but no spinal stenosis or nerve root compression.  Degenerative changes at this level have improved compared to the 2010 MRI with resolution of the right S1 nerve root impingement  POSTURE: decreased lumbar lordosis  PALPATION: Minimal soreness in lumbar paraspinals, not tenderness in glutes bilaterally  LUMBAR ROM:   AROM eval  Flexion 50% limited  Extension 50% limited  Right lateral flexion 50% limited  Left lateral flexion 50% limited  Right  rotation   Left rotation    (Blank rows = not tested)  LOWER EXTREMITY ROM:   Lacks TKE on L    LOWER EXTREMITY MMT:    MMT Right eval Left eval  Hip flexion 4+ 4  Hip extension 4- 3+  Hip abduction 3+ 3+  Hip adduction    Hip internal rotation    Hip external rotation    Knee flexion 5 4+  Knee extension 5 4+  Ankle dorsiflexion 5 4+  Ankle plantarflexion    Ankle inversion    Ankle eversion     (Blank rows = not tested)   FUNCTIONAL TESTS:  5 times sit to stand: 16.73 seconds without UE support 2 minute walk test: 275  GAIT: Distance walked: 275 feet  Assistive device utilized: None Level of assistance: Complete Independence Comments: 2MWT, mild Trendelenburg on L, trunk slightly flexed, mild fatigue following  TODAY'S TREATMENT:                                                                                                                              DATE:  06/04/2022 4: minute walk with 2 rest breaks Breast stroke against wall x 10: I Y to W Hip excursion x 3  Side step x 2 RT  Heel raise x 10  Mini squat x 10 Side-lying :  Clam x 10 both Hip abduction x 10  Supine: Bridge with hip adduction 5" hold  x 15    06/02/22 Treatment began with a 4:00 minute walk Hip excursion x 3 Seated piriformis stretch 1 rep x 10 seconds ( to difficult for patient at this time) Sitting tall x 5" x 3  Supine: Ab set x 10  Bridge x 5 Resisted clam with green theraband  x 10 Knee of chest 3 x 30" 05/28/22 Seated trunk flexion stretch 10 x 5 second holds Standing hip abduction 1 x 10 bilateral  Standing hip extension 1 x 10 bilateral    PATIENT EDUCATION: updated HEP  Education details: Patient educated on exam findings, POC, scope of PT, HEP, and general exercise. Person educated: Patient Education method: Explanation, Demonstration, and Handouts Education comprehension: verbalized understanding, returned demonstration, verbal cues required, and tactile cues  required  HOME EXERCISE PROGRAM: Access Code:  URL: [email or web address] 06/04/2022  Side Stepping with Counter Support  - 1 x daily - 7 x weekly - 2 sets - 10 reps - Heel Raises with Counter Support  - 1 x daily - 7 x weekly - 2 sets - 10 reps - Mini Squat with Counter Support  - 1 x daily - 7 x weekly - 2 sets - 10 rep  Date: 06/02/2022 Prepared by: Rayetta Humphrey  Exercises - Supine Transversus Abdominis Bracing - Hands on Stomach  - 2 x daily - 7 x weekly - 1 sets - 10 reps -  5" hold - Beginner Bridge  - 2 x daily - 7 x weekly - 1 sets - 10 reps - 5" hold - Hooklying Clamshell with Resistance  - 2 x daily - 7 x weekly - 1 sets - 10 reps - 5" hold - Supine Single Knee to Chest Stretch  - 1 x daily - 7 x weekly - 1 sets - 3 reps - 30" hold - Seated Upright Posture Correction  - 1 x daily - 7 x weekly - 1 sets - 3 reps - 5" hold Date: 05/28/2022 - Seated Flexion Stretch  - 2-3 x daily - 7 x weekly - 10 reps - 5 second hold - Standing Hip Abduction with Counter Support  - 2-3 x daily - 7 x weekly - 2 sets - 10 reps - Standing Hip Extension with Counter Support  - 2-3 x daily - 7 x weekly - 3 sets - 10 reps  ASSESSMENT:  CLINICAL IMPRESSION:  Patient continues to have limitations in walking but has started walking to his mail box and back.  Pt continues to demonstrate decreased activity tolerance, decreased strength, decreased ROM, , and functional limitation mobility with ADL. Patient will benefit from skilled physical therapy in order to improve function and reduce impairment.  OBJECTIVE IMPAIRMENTS: Abnormal gait, decreased activity tolerance, decreased balance, decreased endurance, decreased mobility, difficulty walking, decreased ROM, decreased strength, improper body mechanics, postural dysfunction, and pain.   ACTIVITY LIMITATIONS: carrying, lifting, bending, standing, squatting, stairs, transfers, locomotion level, and caring for others  PARTICIPATION  LIMITATIONS: meal prep, cleaning, laundry, shopping, community activity, and yard work  PERSONAL FACTORS: Age, Time since onset of injury/illness/exacerbation, and 3+ comorbidities: CAD, HTN, HLD, Hx MI, lumbar spondylosis  are also affecting patient's functional outcome.   REHAB POTENTIAL: Good  CLINICAL DECISION MAKING: Stable/uncomplicated  EVALUATION COMPLEXITY: Low   GOALS: Goals reviewed with patient? Yes  SHORT TERM GOALS: Target date: 06/18/2022    Patient will be independent with HEP in order to improve functional outcomes. Baseline:  Goal status: IN PROGRESS  2.  Patient will report at least 25% improvement in symptoms for improved quality of life. Baseline:  Goal status: IN PROGRESS   LONG TERM GOALS: Target Date: 07/09/2022    Patient will report at least 75% improvement in symptoms for improved quality of life. Baseline:  Goal status: IN PROGRESS  2.  Patient will be able to complete 5x STS in under 11.4 seconds in order to reduce the risk of falls. Baseline: 16.73 seconds without UE support Goal status: IN PROGRESS  3.  Patient will demonstrate at least 25% improvement in lumbar ROM in all restricted planes for improved ability to move trunk while completing chores. Baseline: see above Goal status: IN PROGRESS  4.  Patient will be able to ambulate at least 400 feet in 2MWT in order to demonstrate improved tolerance to activity. Baseline: 275 feet Goal status: IN PROGRESS  5.  Patient will demonstrate grade of 5/5 MMT grade in all tested musculature as evidence of improved strength to assist with stair ambulation and gait.   Baseline: see above Goal status: IN PROGRESS   PLAN:  PT FREQUENCY: 2x/week  PT DURATION: 6 weeks  PLANNED INTERVENTIONS: Therapeutic exercises, Therapeutic activity, Neuromuscular re-education, Balance training, Gait training, Patient/Family education, Joint manipulation, Joint mobilization, Stair training, Orthotic/Fit training,  DME instructions, Aquatic Therapy, Dry Needling, Electrical stimulation, Spinal manipulation, Spinal mobilization, Cryotherapy, Moist heat, Compression bandaging, scar mobilization, Splintting, Taping, Traction, Ultrasound, Ionotophoresis '4mg'$ /ml Dexamethasone, and  Manual therapy  PLAN FOR NEXT SESSION: f/u with HEP, glute and core strength, lumbar mobility, LE strength/functional strength; educate on proper exercises/gym routines/ walking program as indicated  Rayetta Humphrey, PT CLT [phone]  06/04/2022, 10:30 AM

## 2022-06-10 ENCOUNTER — Other Ambulatory Visit: Payer: Self-pay | Admitting: Cardiovascular Disease

## 2022-06-10 ENCOUNTER — Ambulatory Visit (HOSPITAL_COMMUNITY): Payer: Medicare HMO | Attending: Neurology

## 2022-06-10 DIAGNOSIS — R2689 Other abnormalities of gait and mobility: Secondary | ICD-10-CM

## 2022-06-10 DIAGNOSIS — M6281 Muscle weakness (generalized): Secondary | ICD-10-CM

## 2022-06-10 NOTE — Therapy (Signed)
OUTPATIENT PHYSICAL THERAPY THORACOLUMBAR Treatment   Patient Name: Jason Johnson MRN: [id number] DOB:1940-08-05, 82 y.o., male Today's Date: 06/10/2022  END OF SESSION:  PT End of Session - 06/10/22 0900     Visit Number 4    Number of Visits 12    Date for PT Re-Evaluation 07/09/22    Authorization Type [employer] Medicare (no VL, no auth)    Progress Note Due on Visit 10    PT Start Time 0900    PT Stop Time 0940    PT Time Calculation (min) 40 min    Activity Tolerance Patient tolerated treatment well    Behavior During Therapy [facility] for tasks assessed/performed              Past Medical History:  Diagnosis Date   Carotid artery disease ([place])    Coronary artery disease    history of coronary artery bypass grafting in 1999   Gout    Hyperlipidemia    Hypertension    Myocardial infarction [facility]) 2009   Past Surgical History:  Procedure Laterality Date   CARDIAC CATHETERIZATION  2009   CATARACT EXTRACTION W/PHACO Right 09/09/2012   Procedure: CATARACT EXTRACTION PHACO AND INTRAOCULAR LENS PLACEMENT ([place]);  Surgeon: Tonny Branch, MD;  Location: [facility] ORS;  Service: Ophthalmology;  Laterality: Right;  CDE 14.48   CATARACT EXTRACTION W/PHACO Left 10/07/2012   Procedure: CATARACT EXTRACTION PHACO AND INTRAOCULAR LENS PLACEMENT (IOC);  Surgeon: Tonny Branch, MD;  Location: [facility] ORS;  Service: Ophthalmology;  Laterality: Left;  CDE:12.24   COLONOSCOPY     COLONOSCOPY N/A 02/11/2017   Procedure: COLONOSCOPY;  Surgeon: Daneil Dolin, MD;  Location: [facility] ENDO SUITE;  Service: Endoscopy;  Laterality: N/A;  7:30am   CORONARY ARTERY BYPASS GRAFT  2004   triple   DOPPLER ECHOCARDIOGRAPHY  2010   NM MYOVIEW LTD  2012   TONSILLECTOMY     as a child   Patient Active Problem List   Diagnosis Date Noted   Peripheral neuropathy 05/23/2022   Lumbar spondylosis 05/23/2022   Gait abnormality 02/10/2022   Paresthesia 02/10/2022   Weakness 02/10/2022   Irregular heartbeat 05/04/2019   Diverticula  of colon 04/13/2017   Internal hemorrhoids 04/13/2017   Rectal bleeding 01/06/2017   History of arteriosclerotic cardiovascular disease 01/06/2017   Coronary artery disease 07/06/2013   Carotid artery disease ([place]) 07/06/2013   Essential hypertension 07/06/2013   Hyperlipidemia 07/06/2013    PCP: Redmond School MD  REFERRING PROVIDER: Marcial Pacas, MD  REFERRING DIAG: R26.9 (ICD-10-CM) - Gait abnormality G62.89 (ICD-10-CM) - Other polyneuropathy M47.816 (ICD-10-CM) - Lumbar spondylosis  Rationale for Evaluation and Treatment: Rehabilitation  THERAPY DIAG:  Other abnormalities of gait and mobility  Muscle weakness (generalized)  ONSET DATE: 1 year  SUBJECTIVE:  SUBJECTIVE STATEMENT: Pt states that he is doing the exercises given to him but wonders if they are really helping as they are so easy.    PERTINENT HISTORY:  CAD, HTN, HLD, Hx MI  PAIN:  Are you having pain? Back and hips 2/10 after 4:00 walk.  PRECAUTIONS: None  WEIGHT BEARING RESTRICTIONS: No  FALLS:  Has patient fallen in last 6 months? No  OCCUPATION: Retired  PLOF: Independent  PATIENT GOALS: move better, walk, exercise  NEXT MD VISIT: none scheduled  OBJECTIVE:   DIAGNOSTIC FINDINGS:  MRI 04/10/22: IMPRESSION: This MRI of the lumbar spine without contrast shows the following: At L2-L3, there is severe loss of disc height associated with mild retrolisthesis.  There is moderately severe right lateral recess stenosis with impingement of the right L3 nerve root.  Compared to the 2010 MRI, this level has slightly progressed. At L3-L4, there are degenerative changes causing moderately severe left lateral recess stenosis that could impinge upon the left L4 nerve root.  Degenerative changes at this level have mildly progressed  compared to the 2010 MRI. At L4-L5, there is a right paramedian disc protrusion and other degenerative change causing mild spinal stenosis and moderately severe left lateral recess stenosis which could impinge upon the left L5 nerve root.  Degenerative changes have mildly progressed at this level At L5-S1, there is very minimal retrolisthesis and other degenerative change but no spinal stenosis or nerve root compression.  Degenerative changes at this level have improved compared to the 2010 MRI with resolution of the right S1 nerve root impingement     POSTURE: decreased lumbar lordosis  PALPATION: Minimal soreness in lumbar paraspinals, not tenderness in glutes bilaterally  LUMBAR ROM:   AROM eval  Flexion 50% limited  Extension 50% limited  Right lateral flexion 50% limited  Left lateral flexion 50% limited  Right rotation   Left rotation    (Blank rows = not tested)  LOWER EXTREMITY ROM:   Lacks TKE on L    LOWER EXTREMITY MMT:    MMT Right eval Left eval  Hip flexion 4+ 4  Hip extension 4- 3+  Hip abduction 3+ 3+  Hip adduction    Hip internal rotation    Hip external rotation    Knee flexion 5 4+  Knee extension 5 4+  Ankle dorsiflexion 5 4+  Ankle plantarflexion    Ankle inversion    Ankle eversion     (Blank rows = not tested)   FUNCTIONAL TESTS:  5 times sit to stand: 16.73 seconds without UE support 2 minute walk test: 275  GAIT: Distance walked: 275 feet  Assistive device utilized: None Level of assistance: Complete Independence Comments: 2MWT, mild Trendelenburg on L, trunk slightly flexed, mild fatigue following  TODAY'S TREATMENT:  DATE:  06/02/22 Treatment began with a 4:00 minute walk Hip excursion x 3 Seated piriformis stretch 1 rep x 10 seconds ( to difficult for patient at this time) Sitting tall x 5" x 3   Supine: Ab set x 10  Bridge x 5 Resisted clam with green theraband  x 10 Knee of chest 3 x 30"   05/28/22 Seated trunk flexion stretch 10 x 5 second holds Standing hip abduction 1 x 10 bilateral  Standing hip extension 1 x 10 bilateral    PATIENT EDUCATION:  Education details: Patient educated on exam findings, POC, scope of PT, HEP, and general exercise. Person educated: Patient Education method: Explanation, Demonstration, and Handouts Education comprehension: verbalized understanding, returned demonstration, verbal cues required, and tactile cues required  HOME EXERCISE PROGRAM: Access Code: [email or web address] URL: [email or web address] 06/02/2022 Access Code: [email or web address] URL: [email or web address] Date: 06/02/2022 Prepared by: Rayetta Humphrey  Exercises - Supine Transversus Abdominis Bracing - Hands on Stomach  - 2 x daily - 7 x weekly - 1 sets - 10 reps - 5" hold - Beginner Bridge  - 2 x daily - 7 x weekly - 1 sets - 10 reps - 5" hold - Hooklying Clamshell with Resistance  - 2 x daily - 7 x weekly - 1 sets - 10 reps - 5" hold - Supine Single Knee to Chest Stretch  - 1 x daily - 7 x weekly - 1 sets - 3 reps - 30" hold - Seated Upright Posture Correction  - 1 x daily - 7 x weekly - 1 sets - 3 reps - 5" hold Date: 05/28/2022 - Seated Flexion Stretch  - 2-3 x daily - 7 x weekly - 10 reps - 5 second hold - Standing Hip Abduction with Counter Support  - 2-3 x daily - 7 x weekly - 2 sets - 10 reps - Standing Hip Extension with Counter Support  - 2-3 x daily - 7 x weekly - 3 sets - 10 reps  ASSESSMENT:  CLINICAL IMPRESSION: Therapist reviewed evaluation and goals with pt.  Added walking which pt needed to rest 3 times in a 4 minute walk.  Continued with strengthening and stretching exercises. Patient presents with pain limited deficits in lumbar spine and LE strength, ROM, endurance, activity tolerance, gait, balance, and functional mobility with ADL. Patient is  having to modify and restrict ADL as indicated by outcome measure score as well as subjective information and objective measures which is affecting overall participation. Patient will continue to benefit from skilled physical therapy in order to improve function and reduce impairment.  OBJECTIVE IMPAIRMENTS: Abnormal gait, decreased activity tolerance, decreased balance, decreased endurance, decreased mobility, difficulty walking, decreased ROM, decreased strength, improper body mechanics, postural dysfunction, and pain.   ACTIVITY LIMITATIONS: carrying, lifting, bending, standing, squatting, stairs, transfers, locomotion level, and caring for others  PARTICIPATION LIMITATIONS: meal prep, cleaning, laundry, shopping, community activity, and yard work  PERSONAL FACTORS: Age, Time since onset of injury/illness/exacerbation, and 3+ comorbidities: CAD, HTN, HLD, Hx MI, lumbar spondylosis  are also affecting patient's functional outcome.   REHAB POTENTIAL: Good  CLINICAL DECISION MAKING: Stable/uncomplicated  EVALUATION COMPLEXITY: Low   GOALS: Goals reviewed with patient? Yes  SHORT TERM GOALS: Target date: 06/18/2022    Patient will be independent with HEP in order to improve functional outcomes. Baseline:  Goal status: IN PROGRESS  2.  Patient will report at least 25% improvement in symptoms for improved quality of life. Baseline:  Goal status: IN PROGRESS   LONG  TERM GOALS: Target Date: 07/09/2022    Patient will report at least 75% improvement in symptoms for improved quality of life. Baseline:  Goal status: IN PROGRESS  2.  Patient will be able to complete 5x STS in under 11.4 seconds in order to reduce the risk of falls. Baseline: 16.73 seconds without UE support Goal status: IN PROGRESS  3.  Patient will demonstrate at least 25% improvement in lumbar ROM in all restricted planes for improved ability to move trunk while completing chores. Baseline: see above Goal status:  IN PROGRESS  4.  Patient will be able to ambulate at least 400 feet in 2MWT in order to demonstrate improved tolerance to activity. Baseline: 275 feet Goal status: IN PROGRESS  5.  Patient will demonstrate grade of 5/5 MMT grade in all tested musculature as evidence of improved strength to assist with stair ambulation and gait.   Baseline: see above Goal status: IN PROGRESS   PLAN:  PT FREQUENCY: 2x/week  PT DURATION: 6 weeks  PLANNED INTERVENTIONS: Therapeutic exercises, Therapeutic activity, Neuromuscular re-education, Balance training, Gait training, Patient/Family education, Joint manipulation, Joint mobilization, Stair training, Orthotic/Fit training, DME instructions, Aquatic Therapy, Dry Needling, Electrical stimulation, Spinal manipulation, Spinal mobilization, Cryotherapy, Moist heat, Compression bandaging, scar mobilization, Splintting, Taping, Traction, Ultrasound, Ionotophoresis '4mg'$ /ml Dexamethasone, and Manual therapy  PLAN FOR NEXT SESSION: f/u with HEP, glute and core strength, lumbar mobility, LE strength/functional strength; educate on proper exercises/gym routines/ walking program as indicated  Rayetta Humphrey, PT [place] treatment   Patient Name: Jason Johnson MRN: [id number] DOB:Dec 30, 1940, 82 y.o., male Today's Date: 06/10/2022  END OF SESSION:END OF SESSION:   PT End of Session - 06/10/22 0900     Visit Number 4    Number of Visits 12    Date for PT Re-Evaluation 07/09/22    Authorization Type [employer] Medicare (no VL, no auth)    Progress Note Due on Visit 10    PT Start Time 0900    PT Stop Time 0940    PT Time Calculation (min) 40 min    Activity Tolerance Patient tolerated treatment well    Behavior During Therapy WFL for tasks assessed/performed                  Past Medical History:  Diagnosis Date   Carotid artery disease ([place])    Coronary artery disease    history of coronary  artery bypass grafting in 1999   Gout    Hyperlipidemia    Hypertension    Myocardial infarction [facility]) 2009   Past Surgical History:  Procedure Laterality Date   CARDIAC CATHETERIZATION  2009   CATARACT EXTRACTION W/PHACO Right 09/09/2012   Procedure: CATARACT EXTRACTION PHACO AND INTRAOCULAR LENS PLACEMENT ([place]);  Surgeon: Tonny Branch, MD;  Location: [facility];  Service: Ophthalmology;  Laterality: Right;  CDE 14.48   CATARACT EXTRACTION W/PHACO Left 10/07/2012   Procedure: CATARACT EXTRACTION PHACO AND INTRAOCULAR LENS PLACEMENT (I[facility]);  Surgeon: Tonny Branch, MD;  Location: [facility];  Service: Ophthalmology;  Laterality: Left;  CDE:12.24   COLONOSCOPY     COLONOSCOPY N/A 02/11/2017   Procedure: COLONOSCOPY;  Surgeon: Daneil Dolin, MD;  Location: [facility];  Service: Endoscopy;  Laterality: N/A;  7:30am   CORONARY ARTERY BYPASS GRAFT  2004   triple   DOPPLER ECHOCARDIOGRAPHY  2010   NM MYOVIEW LTD  2012   TONSILLECTOMY     as a child   Patient  Active Problem List   Diagnosis Date Noted   Peripheral neuropathy 05/23/2022   Lumbar spondylosis 05/23/2022   Gait abnormality 02/10/2022   Paresthesia 02/10/2022   Weakness 02/10/2022   Irregular heartbeat 05/04/2019   Diverticula of colon 04/13/2017   Internal hemorrhoids 04/13/2017   Rectal bleeding 01/06/2017   History of arteriosclerotic cardiovascular disease 01/06/2017   Coronary artery disease 07/06/2013   Carotid artery disease ([place]) 07/06/2013   Essential hypertension 07/06/2013   Hyperlipidemia 07/06/2013    PCP: Redmond School MD  REFERRING PROVIDER: Marcial Pacas, MD  REFERRING DIAG: R26.9 (ICD-10-CM) - Gait abnormality G62.89 (ICD-10-CM) - Other polyneuropathy M47.816 (ICD-10-CM) - Lumbar spondylosis  Rationale for Evaluation and Treatment: Rehabilitation  THERAPY DIAG:  Other abnormalities of gait and mobility  Muscle weakness (generalized)  ONSET DATE: 1 year                                                                                                                                                                                             SUBJECTIVE STATEMENT: Reports no pain today just stiffness in the hips; wants to get a good program to transition to the [employer] at the end of therapy  PERTINENT HISTORY:  CAD, HTN, HLD, Hx MI  PAIN:  Are you having pain? No  PRECAUTIONS: None  WEIGHT BEARING RESTRICTIONS: No  FALLS:  Has patient fallen in last 6 months? No  OCCUPATION: Retired  PLOF: Independent  PATIENT GOALS: move better, walk, exercise  NEXT MD VISIT: none scheduled  OBJECTIVE:   DIAGNOSTIC FINDINGS:  MRI 04/10/22: IMPRESSION: This MRI of the lumbar spine without contrast shows the following: At L2-L3, there is severe loss of disc height associated with mild retrolisthesis.  There is moderately severe right lateral recess stenosis with impingement of the right L3 nerve root.  Compared to the 2010 MRI, this level has slightly progressed. At L3-L4, there are degenerative changes causing moderately severe left lateral recess stenosis that could impinge upon the left L4 nerve root.  Degenerative changes at this level have mildly progressed compared to the 2010 MRI. At L4-L5, there is a right paramedian disc protrusion and other degenerative change causing mild spinal stenosis and moderately severe left lateral recess stenosis which could impinge upon the left L5 nerve root.  Degenerative changes have mildly progressed at this level At L5-S1, there is very minimal retrolisthesis and other degenerative change but no spinal stenosis or nerve root compression.  Degenerative changes at this level have improved compared to the 2010 MRI with resolution of the right S1 nerve root impingement  POSTURE: decreased lumbar lordosis  PALPATION: Minimal soreness in  lumbar paraspinals, not tenderness in glutes bilaterally  LUMBAR ROM:   AROM eval  Flexion 50% limited  Extension 50% limited  Right  lateral flexion 50% limited  Left lateral flexion 50% limited  Right rotation   Left rotation    (Blank rows = not tested)  LOWER EXTREMITY ROM:   Lacks TKE on L    LOWER EXTREMITY MMT:    MMT Right eval Left eval  Hip flexion 4+ 4  Hip extension 4- 3+  Hip abduction 3+ 3+  Hip adduction    Hip internal rotation    Hip external rotation    Knee flexion 5 4+  Knee extension 5 4+  Ankle dorsiflexion 5 4+  Ankle plantarflexion    Ankle inversion    Ankle eversion     (Blank rows = not tested)   FUNCTIONAL TESTS:  5 times sit to stand: 16.73 seconds without UE support 2 minute walk test: 275  GAIT: Distance walked: 275 feet  Assistive device utilized: None Level of assistance: Complete Independence Comments: 2MWT, mild Trendelenburg on L, trunk slightly flexed, mild fatigue following  TODAY'S TREATMENT:                                                                                                                              DATE:  06/10/2022 Nustep seat 11 arms 8 level 2 x 5' for dynamic warm up  Standing: Heel/toe raises x 20 Hip vectors 3" hold x 5 each Squats to chair for target x 10 Tandem stance 2 x 30" each way 6" step ups x 8 each; right no UE assist; left 1 UE assist 4" lateral step ups x 8 each with 1 UE assist   06/04/2022 4: minute walk with 2 rest breaks Breast stroke against wall x 10: I Y to W Hip excursion x 3  Side step x 2 RT  Heel raise x 10  Mini squat x 10 Side-lying :  Clam x 10 both Hip abduction x 10  Supine: Bridge with hip adduction 5" hold  x 15    06/02/22 Treatment began with a 4:00 minute walk Hip excursion x 3 Seated piriformis stretch 1 rep x 10 seconds ( to difficult for patient at this time) Sitting tall x 5" x 3  Supine: Ab set x 10  Bridge x 5 Resisted clam with green theraband  x 10 Knee of chest 3 x 30" 05/28/22 Seated trunk flexion stretch 10 x 5 second holds Standing hip abduction 1 x 10 bilateral   Standing hip extension 1 x 10 bilateral    PATIENT EDUCATION: updated HEP  Education details: Patient educated on exam findings, POC, scope of PT, HEP, and general exercise. Person educated: Patient Education method: Explanation, Demonstration, and Handouts Education comprehension: verbalized understanding, returned demonstration, verbal cues required, and tactile cues required  HOME EXERCISE PROGRAM: 06/10/22 tandem stance  Access Code:  URL: [email or web address] 06/04/2022  Side Stepping with Counter  Support  - 1 x daily - 7 x weekly - 2 sets - 10 reps - Heel Raises with Counter Support  - 1 x daily - 7 x weekly - 2 sets - 10 reps - Mini Squat with Counter Support  - 1 x daily - 7 x weekly - 2 sets - 10 rep  Date: 06/02/2022 Prepared by: Rayetta Humphrey  Exercises - Supine Transversus Abdominis Bracing - Hands on Stomach  - 2 x daily - 7 x weekly - 1 sets - 10 reps - 5" hold - Beginner Bridge  - 2 x daily - 7 x weekly - 1 sets - 10 reps - 5" hold - Hooklying Clamshell with Resistance  - 2 x daily - 7 x weekly - 1 sets - 10 reps - 5" hold - Supine Single Knee to Chest Stretch  - 1 x daily - 7 x weekly - 1 sets - 3 reps - 30" hold - Seated Upright Posture Correction  - 1 x daily - 7 x weekly - 1 sets - 3 reps - 5" hold Date: 05/28/2022 - Seated Flexion Stretch  - 2-3 x daily - 7 x weekly - 10 reps - 5 second hold - Standing Hip Abduction with Counter Support  - 2-3 x daily - 7 x weekly - 2 sets - 10 reps - Standing Hip Extension with Counter Support  - 2-3 x daily - 7 x weekly - 3 sets - 10 reps  ASSESSMENT:  CLINICAL IMPRESSION:  Today's session continued to work on core and lower extremity strengthening, added some balance activity today with good challenge.  Added tandem stance to HEP. Patient needs 1 UE assist or stepups on left today versus no UE assist with right.   Patient will benefit from skilled physical therapy in order to improve function and reduce  impairment.  OBJECTIVE IMPAIRMENTS: Abnormal gait, decreased activity tolerance, decreased balance, decreased endurance, decreased mobility, difficulty walking, decreased ROM, decreased strength, improper body mechanics, postural dysfunction, and pain.   ACTIVITY LIMITATIONS: carrying, lifting, bending, standing, squatting, stairs, transfers, locomotion level, and caring for others  PARTICIPATION LIMITATIONS: meal prep, cleaning, laundry, shopping, community activity, and yard work  PERSONAL FACTORS: Age, Time since onset of injury/illness/exacerbation, and 3+ comorbidities: CAD, HTN, HLD, Hx MI, lumbar spondylosis  are also affecting patient's functional outcome.   REHAB POTENTIAL: Good  CLINICAL DECISION MAKING: Stable/uncomplicated  EVALUATION COMPLEXITY: Low   GOALS: Goals reviewed with patient? Yes  SHORT TERM GOALS: Target date: 06/18/2022    Patient will be independent with HEP in order to improve functional outcomes. Baseline:  Goal status: IN PROGRESS  2.  Patient will report at least 25% improvement in symptoms for improved quality of life. Baseline:  Goal status: IN PROGRESS   LONG TERM GOALS: Target Date: 07/09/2022    Patient will report at least 75% improvement in symptoms for improved quality of life. Baseline:  Goal status: IN PROGRESS  2.  Patient will be able to complete 5x STS in under 11.4 seconds in order to reduce the risk of falls. Baseline: 16.73 seconds without UE support Goal status: IN PROGRESS  3.  Patient will demonstrate at least 25% improvement in lumbar ROM in all restricted planes for improved ability to move trunk while completing chores. Baseline: see above Goal status: IN PROGRESS  4.  Patient will be able to ambulate at least 400 feet in 2MWT in order to demonstrate improved tolerance to activity. Baseline: 275 feet Goal status: IN PROGRESS  5.  Patient will demonstrate grade of 5/5 MMT grade in all tested musculature as evidence of  improved strength to assist with stair ambulation and gait.   Baseline: see above Goal status: IN PROGRESS   PLAN:  PT FREQUENCY: 2x/week  PT DURATION: 6 weeks  PLANNED INTERVENTIONS: Therapeutic exercises, Therapeutic activity, Neuromuscular re-education, Balance training, Gait training, Patient/Family education, Joint manipulation, Joint mobilization, Stair training, Orthotic/Fit training, DME instructions, Aquatic Therapy, Dry Needling, Electrical stimulation, Spinal manipulation, Spinal mobilization, Cryotherapy, Moist heat, Compression bandaging, scar mobilization, Splintting, Taping, Traction, Ultrasound, Ionotophoresis '4mg'$ /ml Dexamethasone, and Manual therapy  PLAN FOR NEXT SESSION: f/u with HEP, glute and core strength, lumbar mobility, LE strength/functional strength; educate on proper exercises/gym routines/ walking program as indicated  9:42 AM, 06/10/22 Omah Dewalt Small Jowan Skillin MPT [place] [facility] [place] [phone] [phone]

## 2022-06-12 ENCOUNTER — Ambulatory Visit (HOSPITAL_COMMUNITY): Payer: Medicare HMO

## 2022-06-12 DIAGNOSIS — R2689 Other abnormalities of gait and mobility: Secondary | ICD-10-CM | POA: Diagnosis not present

## 2022-06-12 DIAGNOSIS — M6281 Muscle weakness (generalized): Secondary | ICD-10-CM

## 2022-06-12 NOTE — Therapy (Signed)
OUTPATIENT PHYSICAL THERAPY THORACOLUMBAR treatment   Patient Name: Jason Johnson MRN: [id number] DOB:1941/03/23, 82 y.o., male Today's Date: 06/12/2022  END OF SESSION:   PT End of Session - 06/12/22 0739     Visit Number 5    Number of Visits 12    Date for PT Re-Evaluation 07/09/22    Authorization Type A[employer] Medicare (no VL, no auth)    Progress Note Due on Visit 10    PT Start Time 0735    PT Stop Time 0815    PT Time Calculation (min) 40 min    Activity Tolerance Patient tolerated treatment well    Behavior During Therapy [facility] for tasks assessed/performed               Past Medical History:  Diagnosis Date   Carotid artery disease ([place])    Coronary artery disease    history of coronary artery bypass grafting in 1999   Gout    Hyperlipidemia    Hypertension    Myocardial infarction [facility]) 2009   Past Surgical History:  Procedure Laterality Date   CARDIAC CATHETERIZATION  2009   CATARACT EXTRACTION W/PHACO Right 09/09/2012   Procedure: CATARACT EXTRACTION PHACO AND INTRAOCULAR LENS PLACEMENT ([place]);  Surgeon: Tonny Branch, MD;  Location: [facility] ORS;  Service: Ophthalmology;  Laterality: Right;  CDE 14.48   CATARACT EXTRACTION W/PHACO Left 10/07/2012   Procedure: CATARACT EXTRACTION PHACO AND INTRAOCULAR LENS PLACEMENT (IOC);  Surgeon: Tonny Branch, MD;  Location: [facility] ORS;  Service: Ophthalmology;  Laterality: Left;  CDE:12.24   COLONOSCOPY     COLONOSCOPY N/A 02/11/2017   Procedure: COLONOSCOPY;  Surgeon: Daneil Dolin, MD;  Location: [facility] ENDO SUITE;  Service: Endoscopy;  Laterality: N/A;  7:30am   CORONARY ARTERY BYPASS GRAFT  2004   triple   DOPPLER ECHOCARDIOGRAPHY  2010   NM MYOVIEW LTD  2012   TONSILLECTOMY     as a child   Patient Active Problem List   Diagnosis Date Noted   Peripheral neuropathy 05/23/2022   Lumbar spondylosis 05/23/2022   Gait abnormality 02/10/2022   Paresthesia 02/10/2022   Weakness 02/10/2022   Irregular heartbeat 05/04/2019    Diverticula of colon 04/13/2017   Internal hemorrhoids 04/13/2017   Rectal bleeding 01/06/2017   History of arteriosclerotic cardiovascular disease 01/06/2017   Coronary artery disease 07/06/2013   Carotid artery disease ([place]) 07/06/2013   Essential hypertension 07/06/2013   Hyperlipidemia 07/06/2013    PCP: Redmond School MD  REFERRING PROVIDER: Marcial Pacas, MD  REFERRING DIAG: R26.9 (ICD-10-CM) - Gait abnormality G62.89 (ICD-10-CM) - Other polyneuropathy M47.816 (ICD-10-CM) - Lumbar spondylosis  Rationale for Evaluation and Treatment: Rehabilitation  THERAPY DIAG:  Other abnormalities of gait and mobility  Muscle weakness (generalized)  ONSET DATE: 1 year  SUBJECTIVE STATEMENT: Patient states that the back and hips are hurting but they are not debilitating. Rates it at 1/10. However, patient reports that the LE are weak now from the hips down to the knees.  PERTINENT HISTORY:  CAD, HTN, HLD, Hx MI  PAIN:  Are you having pain? No  PRECAUTIONS: None  WEIGHT BEARING RESTRICTIONS: No  FALLS:  Has patient fallen in last 6 months? No  OCCUPATION: Retired  PLOF: Independent  PATIENT GOALS: move better, walk, exercise  NEXT MD VISIT: none scheduled  OBJECTIVE:   DIAGNOSTIC FINDINGS:  MRI 04/10/22: IMPRESSION: This MRI of the lumbar spine without contrast shows the following: At L2-L3, there is severe loss of disc height associated with mild retrolisthesis.  There is moderately severe right lateral recess stenosis with impingement of the right L3 nerve root.  Compared to the 2010 MRI, this level has slightly progressed. At L3-L4, there are degenerative changes causing moderately severe left lateral recess stenosis that could impinge upon the left L4 nerve root.  Degenerative changes at this  level have mildly progressed compared to the 2010 MRI. At L4-L5, there is a right paramedian disc protrusion and other degenerative change causing mild spinal stenosis and moderately severe left lateral recess stenosis which could impinge upon the left L5 nerve root.  Degenerative changes have mildly progressed at this level At L5-S1, there is very minimal retrolisthesis and other degenerative change but no spinal stenosis or nerve root compression.  Degenerative changes at this level have improved compared to the 2010 MRI with resolution of the right S1 nerve root impingement  POSTURE: decreased lumbar lordosis  PALPATION: Minimal soreness in lumbar paraspinals, not tenderness in glutes bilaterally  LUMBAR ROM:   AROM eval  Flexion 50% limited  Extension 50% limited  Right lateral flexion 50% limited  Left lateral flexion 50% limited  Right rotation   Left rotation    (Blank rows = not tested)  LOWER EXTREMITY ROM:   Lacks TKE on L    LOWER EXTREMITY MMT:    MMT Right eval Left eval  Hip flexion 4+ 4  Hip extension 4- 3+  Hip abduction 3+ 3+  Hip adduction    Hip internal rotation    Hip external rotation    Knee flexion 5 4+  Knee extension 5 4+  Ankle dorsiflexion 5 4+  Ankle plantarflexion    Ankle inversion    Ankle eversion     (Blank rows = not tested)   FUNCTIONAL TESTS:  5 times sit to stand: 16.73 seconds without UE support 2 minute walk test: 275  GAIT: Distance walked: 275 feet  Assistive device utilized: None Level of assistance: Complete Independence Comments: 2MWT, mild Trendelenburg on L, trunk slightly flexed, mild fatigue following  TODAY'S TREATMENT:  DATE:  06/12/2022 Nustep seat 11 arms 8 level 3 x 5' for dynamic warm up Seated hamstring stretch x 30" x 3  Standing: Gastrocnemius slant board stretch x 30" x  3 Heel/toe raises x 20 Hip vectors, GTB x 10 each Mini squats x 3" x 10 x 2 6" step ups x 10 x 2, no UE assist  06/10/2022 Nustep seat 11 arms 8 level 2 x 5' for dynamic warm up  Standing: Heel/toe raises x 20 Hip vectors 3" hold x 5 each Squats to chair for target x 10 Tandem stance 2 x 30" each way 6" step ups x 8 each; right no UE assist; left 1 UE assist 4" lateral step ups x 8 each with 1 UE assist   06/04/2022 4: minute walk with 2 rest breaks Breast stroke against wall x 10: I Y to W Hip excursion x 3  Side step x 2 RT  Heel raise x 10  Mini squat x 10 Side-lying :  Clam x 10 both Hip abduction x 10  Supine: Bridge with hip adduction 5" hold  x 15    06/02/22 Treatment began with a 4:00 minute walk Hip excursion x 3 Seated piriformis stretch 1 rep x 10 seconds ( to difficult for patient at this time) Sitting tall x 5" x 3  Supine: Ab set x 10  Bridge x 5 Resisted clam with green theraband  x 10 Knee of chest 3 x 30" 05/28/22 Seated trunk flexion stretch 10 x 5 second holds Standing hip abduction 1 x 10 bilateral  Standing hip extension 1 x 10 bilateral    PATIENT EDUCATION: updated HEP  Education details: Patient educated on exam findings, POC, scope of PT, HEP, and general exercise. Person educated: Patient Education method: Explanation, Demonstration, and Handouts Education comprehension: verbalized understanding, returned demonstration, verbal cues required, and tactile cues required  HOME EXERCISE PROGRAM: Access Code: [email or web address] URL: [email or web address] Date: 06/12/2022 Prepared by: Rexene Alberts  Exercises - Seated Hamstring Stretch  - 2 x daily - 7 x weekly - 3 reps - 30 hold  06/10/22 tandem stance  Access Code:  URL: [email or web address] 06/[email or web address]/2022  Side Stepping with Counter Support  - 1 x daily - 7 x weekly - 2 sets - 10 reps - Heel Raises with Counter Support  - 1 x daily - 7 x weekly - 2 sets - 10 reps -  Mini Squat with Counter Support  - 1 x daily - 7 x weekly - 2 sets - 10 rep  Date: 06/02/2022 Prepared by: Rayetta Humphrey  Exercises - Supine Transversus Abdominis Bracing - Hands on Stomach  - 2 x daily - 7 x weekly - 1 sets - 10 reps - 5" hold - Beginner Bridge  - 2 x daily - 7 x weekly - 1 sets - 10 reps - 5" hold - Hooklying Clamshell with Resistance  - 2 x daily - 7 x weekly - 1 sets - 10 reps - 5" hold - Supine Single Knee to Chest Stretch  - 1 x daily - 7 x weekly - 1 sets - 3 reps - 30" hold - Seated Upright Posture Correction  - 1 x daily - 7 x weekly - 1 sets - 3 reps - 5" hold Date: 05/28/2022 - Seated Flexion Stretch  - 2-3 x daily - 7 x weekly - 10 reps - 5 second hold - Standing Hip Abduction with Counter Support  - 2-3 x daily - 7 x weekly -  2 sets - 10 reps - Standing Hip Extension with Counter Support  - 2-3 x daily - 7 x weekly - 3 sets - 10 reps  ASSESSMENT:  CLINICAL IMPRESSION: Interventions today were geared towards LE strengthening and flexibility. Tolerated all activities with mild levels of fatigue due to weakness. Pacing of activities was slightly slow. Rest periods provided. Provided slight amount of cueing to ensure correct execution of activity with good carry-over. To date, skilled PT is required to address the impairments and improve function.   OBJECTIVE IMPAIRMENTS: Abnormal gait, decreased activity tolerance, decreased balance, decreased endurance, decreased mobility, difficulty walking, decreased ROM, decreased strength, improper body mechanics, postural dysfunction, and pain.   ACTIVITY LIMITATIONS: carrying, lifting, bending, standing, squatting, stairs, transfers, locomotion level, and caring for others  PARTICIPATION LIMITATIONS: meal prep, cleaning, laundry, shopping, community activity, and yard work  PERSONAL FACTORS: Age, Time since onset of injury/illness/exacerbation, and 3+ comorbidities: CAD, HTN, HLD, Hx MI, lumbar spondylosis  are also  affecting patient's functional outcome.   REHAB POTENTIAL: Good  CLINICAL DECISION MAKING: Stable/uncomplicated  EVALUATION COMPLEXITY: Low   GOALS: Goals reviewed with patient? Yes  SHORT TERM GOALS: Target date: 06/18/2022    Patient will be independent with HEP in order to improve functional outcomes. Baseline:  Goal status: IN PROGRESS  2.  Patient will report at least 25% improvement in symptoms for improved quality of life. Baseline:  Goal status: IN PROGRESS   LONG TERM GOALS: Target Date: 07/09/2022    Patient will report at least 75% improvement in symptoms for improved quality of life. Baseline:  Goal status: IN PROGRESS  2.  Patient will be able to complete 5x STS in under 11.4 seconds in order to reduce the risk of falls. Baseline: 16.73 seconds without UE support Goal status: IN PROGRESS  3.  Patient will demonstrate at least 25% improvement in lumbar ROM in all restricted planes for improved ability to move trunk while completing chores. Baseline: see above Goal status: IN PROGRESS  4.  Patient will be able to ambulate at least 400 feet in 2MWT in order to demonstrate improved tolerance to activity. Baseline: 275 feet Goal status: IN PROGRESS  5.  Patient will demonstrate grade of 5/5 MMT grade in all tested musculature as evidence of improved strength to assist with stair ambulation and gait.   Baseline: see above Goal status: IN PROGRESS   PLAN:  PT FREQUENCY: 2x/week  PT DURATION: 6 weeks  PLANNED INTERVENTIONS: Therapeutic exercises, Therapeutic activity, Neuromuscular re-education, Balance training, Gait training, Patient/Family education, Joint manipulation, Joint mobilization, Stair training, Orthotic/Fit training, DME instructions, Aquatic Therapy, Dry Needling, Electrical stimulation, Spinal manipulation, Spinal mobilization, Cryotherapy, Moist heat, Compression bandaging, scar mobilization, Splintting, Taping, Traction, Ultrasound,  Ionotophoresis '4mg'$ /ml Dexamethasone, and Manual therapy  PLAN FOR NEXT SESSION: f/u with HEP, glute and core strength, lumbar mobility, LE strength/functional strength; educate on proper exercises/gym routines/ walking program as indicated  7:43 AM, 06/12/22 Chrissie Noa L. Nyheem Binette, PT, DPT, OCS Board-Certified Clinical Specialist in [place] # ([place]): [id number] T

## 2022-06-16 ENCOUNTER — Ambulatory Visit (HOSPITAL_COMMUNITY): Payer: Medicare HMO

## 2022-06-16 DIAGNOSIS — M6281 Muscle weakness (generalized): Secondary | ICD-10-CM

## 2022-06-16 DIAGNOSIS — R2689 Other abnormalities of gait and mobility: Secondary | ICD-10-CM

## 2022-06-16 NOTE — Therapy (Signed)
OUTPATIENT PHYSICAL THERAPY THORACOLUMBAR treatment   Patient Name: Jason Johnson MRN: [id number] DOB:02-07-1941, 82 y.o., male Today's Date: 06/16/2022  END OF SESSION:   PT End of Session - 06/16/22 0827     Visit Number 6    Number of Visits 12    Date for PT Re-Evaluation 07/09/22    Authorization Type [employer]etna Medicare (no VL, no auth)    Progress Note Due on Visit 10    PT Start Time 0823    PT Stop Time 0901    PT Time Calculation (min) 38 min    Activity Tolerance Patient tolerated treatment well    Behavior During Therapy [facility] for tasks assessed/performed               Past Medical History:  Diagnosis Date   Carotid artery disease ([place])    Coronary artery disease    history of coronary artery bypass grafting in 1999   Gout    Hyperlipidemia    Hypertension    Myocardial infarction [facility]) 2009   Past Surgical History:  Procedure Laterality Date   CARDIAC CATHETERIZATION  2009   CATARACT EXTRACTION W/PHACO Right 09/09/2012   Procedure: CATARACT EXTRACTION PHACO AND INTRAOCULAR LENS PLACEMENT ([place]);  Surgeon: Tonny Branch, MD;  Location: [facility];  Service: Ophthalmology;  Laterality: Right;  CDE 14.48   CATARACT EXTRACTION W/PHACO Left 10/07/2012   Procedure: CATARACT EXTRACTION PHACO AND INTRAOCULAR LENS PLACEMENT (IOC);  Surgeon: Tonny Branch, MD;  Location: [facility];  Service: Ophthalmology;  Laterality: Left;  CDE:12.24   COLONOSCOPY     COLONOSCOPY N/A 02/11/2017   Procedure: COLONOSCOPY;  Surgeon: Daneil Dolin, MD;  Location: [facility];  Service: Endoscopy;  Laterality: N/A;  7:30am   CORONARY ARTERY BYPASS GRAFT  2004   triple   DOPPLER ECHOCARDIOGRAPHY  2010   NM MYOVIEW LTD  2012   TONSILLECTOMY     as a child   Patient Active Problem List   Diagnosis Date Noted   Peripheral neuropathy 05/23/2022   Lumbar spondylosis 05/23/2022   Gait abnormality 02/10/2022   Paresthesia 02/10/2022   Weakness 02/10/2022   Irregular heartbeat 05/04/2019    Diverticula of colon 04/13/2017   Internal hemorrhoids 04/13/2017   Rectal bleeding 01/06/2017   History of arteriosclerotic cardiovascular disease 01/06/2017   Coronary artery disease 07/06/2013   Carotid artery disease ([place]) 07/06/2013   Essential hypertension 07/06/2013   Hyperlipidemia 07/06/2013    PCP: Redmond School MD  REFERRING PROVIDER: Marcial Pacas, MD  REFERRING DIAG: R26.9 (ICD-10-CM) - Gait abnormality G62.89 (ICD-10-CM) - Other polyneuropathy M47.816 (ICD-10-CM) - Lumbar spondylosis  Rationale for Evaluation and Treatment: Rehabilitation  THERAPY DIAG:  Other abnormalities of gait and mobility  Muscle weakness (generalized)  ONSET DATE: 1 year  SUBJECTIVE STATEMENT: "getting easier to walk"; reports hips are aching less; reports he is going to the [employer] to check on a membership this week  PERTINENT HISTORY:  CAD, HTN, HLD, Hx MI  PAIN:  Are you having pain? No  PRECAUTIONS: None  WEIGHT BEARING RESTRICTIONS: No  FALLS:  Has patient fallen in last 6 months? No  OCCUPATION: Retired  PLOF: Independent  PATIENT GOALS: move better, walk, exercise  NEXT MD VISIT: none scheduled  OBJECTIVE:   DIAGNOSTIC FINDINGS:  MRI 04/10/22: IMPRESSION: This MRI of the lumbar spine without contrast shows the following: At L2-L3, there is severe loss of disc height associated with mild retrolisthesis.  There is moderately severe right lateral recess stenosis with impingement of the right L3 nerve root.  Compared to the 2010 MRI, this level has slightly progressed. At L3-L4, there are degenerative changes causing moderately severe left lateral recess stenosis that could impinge upon the left L4 nerve root.  Degenerative changes at this level have mildly progressed compared to the 2010 MRI. At  L4-L5, there is a right paramedian disc protrusion and other degenerative change causing mild spinal stenosis and moderately severe left lateral recess stenosis which could impinge upon the left L5 nerve root.  Degenerative changes have mildly progressed at this level At L5-S1, there is very minimal retrolisthesis and other degenerative change but no spinal stenosis or nerve root compression.  Degenerative changes at this level have improved compared to the 2010 MRI with resolution of the right S1 nerve root impingement  POSTURE: decreased lumbar lordosis  PALPATION: Minimal soreness in lumbar paraspinals, not tenderness in glutes bilaterally  LUMBAR ROM:   AROM eval  Flexion 50% limited  Extension 50% limited  Right lateral flexion 50% limited  Left lateral flexion 50% limited  Right rotation   Left rotation    (Blank rows = not tested)  LOWER EXTREMITY ROM:   Lacks TKE on L    LOWER EXTREMITY MMT:    MMT Right eval Left eval  Hip flexion 4+ 4  Hip extension 4- 3+  Hip abduction 3+ 3+  Hip adduction    Hip internal rotation    Hip external rotation    Knee flexion 5 4+  Knee extension 5 4+  Ankle dorsiflexion 5 4+  Ankle plantarflexion    Ankle inversion    Ankle eversion     (Blank rows = not tested)   FUNCTIONAL TESTS:  5 times sit to stand: 16.73 seconds without UE support 2 minute walk test: 275  GAIT: Distance walked: 275 feet  Assistive device utilized: None Level of assistance: Complete Independence Comments: 2MWT, mild Trendelenburg on L, trunk slightly flexed, mild fatigue following  TODAY'S TREATMENT:                                                                                                                              DATE:  06/16/22 Standing: Heel/toe raises 2 x 10 Slant  board 5 x 20" Mini squat 2 x 10 Tandem stance 2 x 30" each 6" step ups x 10 each no UE assist Hip abduction GTB around mid shin x 10 each  Seated: Hamstring stretch  with strap 3 x 20"  Nustep seat 11 arms 8 level 3 x 5'      06/12/2022 Nustep seat 11 arms 8 level 3 x 5' for dynamic warm up Seated hamstring stretch x 30" x 3  Standing: Gastrocnemius slant board stretch x 30" x 3 Heel/toe raises x 20 Hip vectors, GTB x 10 each Mini squats x 3" x 10 x 2 6" step ups x 10 x 2, no UE assist  06/10/2022 Nustep seat 11 arms 8 level 2 x 5' for dynamic warm up  Standing: Heel/toe raises x 20 Hip vectors 3" hold x 5 each Squats to chair for target x 10 Tandem stance 2 x 30" each way 6" step ups x 8 each; right no UE assist; left 1 UE assist 4" lateral step ups x 8 each with 1 UE assist   06/04/2022 4: minute walk with 2 rest breaks Breast stroke against wall x 10: I Y to W Hip excursion x 3  Side step x 2 RT  Heel raise x 10  Mini squat x 10 Side-lying :  Clam x 10 both Hip abduction x 10  Supine: Bridge with hip adduction 5" hold  x 15    06/02/22 Treatment began with a 4:00 minute walk Hip excursion x 3 Seated piriformis stretch 1 rep x 10 seconds ( to difficult for patient at this time) Sitting tall x 5" x 3  Supine: Ab set x 10  Bridge x 5 Resisted clam with green theraband  x 10 Knee of chest 3 x 30" 05/28/22 Seated trunk flexion stretch 10 x 5 second holds Standing hip abduction 1 x 10 bilateral  Standing hip extension 1 x 10 bilateral    PATIENT EDUCATION: updated HEP  Education details: Patient educated on exam findings, POC, scope of PT, HEP, and general exercise. Person educated: Patient Education method: Explanation, Demonstration, and Handouts Education comprehension: verbalized understanding, returned demonstration, verbal cues required, and tactile cues required  HOME EXERCISE PROGRAM: Access Code: [email or web address] URL: [email or web address] Date: 06/12/2022 Prepared by: Rexene Alberts  Exercises - Seated Hamstring Stretch  - 2 x daily - 7 x weekly - 3 reps - 30 hold  06/10/22 tandem stance  Access  Code:  URL: [email or web address] 06/04/2022  Side Stepping with Counter Support  - 1 x daily - 7 x weekly - 2 sets - 10 reps - Heel Raises with Counter Support  - 1 x daily - 7 x weekly - 2 sets - 10 reps - Mini Squat with Counter Support  - 1 x daily - 7 x weekly - 2 sets - 10 rep  Date: 06/02/2022 Prepared by: Rayetta Humphrey  Exercises - Supine Transversus Abdominis Bracing - Hands on Stomach  - 2 x daily - 7 x weekly - 1 sets - 10 reps - 5" hold - Beginner Bridge  - 2 x daily - 7 x weekly - 1 sets - 10 reps - 5" hold - Hooklying Clamshell with Resistance  - 2 x daily - 7 x weekly - 1 sets - 10 reps - 5" hold - Supine Single Knee to Chest Stretch  - 1 x daily - 7 x weekly - 1 sets - 3 reps - 30" hold - Seated Upright Posture Correction  -  1 x daily - 7 x weekly - 1 sets - 3 reps - 5" hold Date: 05/28/2022 - Seated Flexion Stretch  - 2-3 x daily - 7 x weekly - 10 reps - 5 second hold - Standing Hip Abduction with Counter Support  - 2-3 x daily - 7 x weekly - 2 sets - 10 reps - Standing Hip Extension with Counter Support  - 2-3 x daily - 7 x weekly - 3 sets - 10 reps  ASSESSMENT:  CLINICAL IMPRESSION: Today's session continued to work on lower extremity and core strengthening and balance.  Still challenged with tandem stance and states he has forgotten to do this one at home.  Instructed to perform near a counter or wall for safety. Patient needs cues for maintaining good posturing with step ups and he tends to want to go into some trunk flexion especially as he fatigues; more difficulty with left leg than right with step ups.  To date, skilled PT is required to address the impairments and improve function.   OBJECTIVE IMPAIRMENTS: Abnormal gait, decreased activity tolerance, decreased balance, decreased endurance, decreased mobility, difficulty walking, decreased ROM, decreased strength, improper body mechanics, postural dysfunction, and pain.   ACTIVITY LIMITATIONS:  carrying, lifting, bending, standing, squatting, stairs, transfers, locomotion level, and caring for others  PARTICIPATION LIMITATIONS: meal prep, cleaning, laundry, shopping, community activity, and yard work  PERSONAL FACTORS: Age, Time since onset of injury/illness/exacerbation, and 3+ comorbidities: CAD, HTN, HLD, Hx MI, lumbar spondylosis  are also affecting patient's functional outcome.   REHAB POTENTIAL: Good  CLINICAL DECISION MAKING: Stable/uncomplicated  EVALUATION COMPLEXITY: Low   GOALS: Goals reviewed with patient? Yes  SHORT TERM GOALS: Target date: 06/18/2022    Patient will be independent with HEP in order to improve functional outcomes. Baseline:  Goal status: IN PROGRESS  2.  Patient will report at least 25% improvement in symptoms for improved quality of life. Baseline:  Goal status: IN PROGRESS   LONG TERM GOALS: Target Date: 07/09/2022    Patient will report at least 75% improvement in symptoms for improved quality of life. Baseline:  Goal status: IN PROGRESS  2.  Patient will be able to complete 5x STS in under 11.4 seconds in order to reduce the risk of falls. Baseline: 16.73 seconds without UE support Goal status: IN PROGRESS  3.  Patient will demonstrate at least 25% improvement in lumbar ROM in all restricted planes for improved ability to move trunk while completing chores. Baseline: see above Goal status: IN PROGRESS  4.  Patient will be able to ambulate at least 400 feet in 2MWT in order to demonstrate improved tolerance to activity. Baseline: 275 feet Goal status: IN PROGRESS  5.  Patient will demonstrate grade of 5/5 MMT grade in all tested musculature as evidence of improved strength to assist with stair ambulation and gait.   Baseline: see above Goal status: IN PROGRESS   PLAN:  PT FREQUENCY: 2x/week  PT DURATION: 6 weeks  PLANNED INTERVENTIONS: Therapeutic exercises, Therapeutic activity, Neuromuscular re-education, Balance  training, Gait training, Patient/Family education, Joint manipulation, Joint mobilization, Stair training, Orthotic/Fit training, DME instructions, Aquatic Therapy, Dry Needling, Electrical stimulation, Spinal manipulation, Spinal mobilization, Cryotherapy, Moist heat, Compression bandaging, scar mobilization, Splintting, Taping, Traction, Ultrasound, Ionotophoresis '4mg'$ /ml Dexamethasone, and Manual therapy  PLAN FOR NEXT SESSION: f/u with HEP, glute and core strength, lumbar mobility, LE strength/functional strength; educate on proper exercises/gym routines/ walking program as indicated  9:00 AM, 06/16/22 Elma Shands Small Ovie Eastep MPT [place] [facility]  [facility] [place] [phone]

## 2022-06-18 ENCOUNTER — Ambulatory Visit (HOSPITAL_COMMUNITY): Payer: Medicare HMO

## 2022-06-18 DIAGNOSIS — R2689 Other abnormalities of gait and mobility: Secondary | ICD-10-CM

## 2022-06-18 DIAGNOSIS — M6281 Muscle weakness (generalized): Secondary | ICD-10-CM

## 2022-06-18 NOTE — Therapy (Signed)
OUTPATIENT PHYSICAL THERAPY THORACOLUMBAR treatment   Patient Name: Jason Johnson MRN: [id number] DOB:[birth year], 82 y.o., male Today's Date: 06/18/2022  END OF SESSION:   PT End of Session - 06/18/22 0815     Visit Number 7    Number of Visits 12    Date for PT Re-Evaluation 07/09/22    Authorization Type [employer] (no VL, no auth)    Progress Note Due on Visit 10    PT Start Time 0815    PT Stop Time [id number]    PT Time Calculation (min) 40 min    Activity Tolerance Patient tolerated treatment well    Behavior During Therapy [facility] for tasks assessed/performed               Past Medical History:  Diagnosis Date   Carotid artery disease ([place])    Coronary artery disease    history of coronary artery bypass grafting in 1999   Gout    Hyperlipidemia    Hypertension    Myocardial infarction [facility]) 2009   Past Surgical History:  Procedure Laterality Date   CARDIAC CATHETERIZATION  2009   CATARACT EXTRACTION W/PHACO Right 09/09/2012   Procedure: CATARACT EXTRACTION PHACO AND INTRAOCULAR LENS PLACEMENT ([place]);  Surgeon: Tonny Branch, MD;  Location: [facility];  Service: Ophthalmology;  Laterality: Right;  CDE 14.48   CATARACT EXTRACTION W/PHACO Left 10/07/2012   Procedure: CATARACT EXTRACTION PHACO AND INTRAOCULAR LENS PLACEMENT ([facility]);  Surgeon: Tonny Branch, MD;  Location: [facility];  Service: Ophthalmology;  Laterality: Left;  CDE:12.24   COLONOSCOPY     COLONOSCOPY N/A 02/11/2017   Procedure: COLONOSCOPY;  Surgeon: Daneil Dolin, MD;  Location: [facility] SUITE;  Service: Endoscopy;  Laterality: N/A;  7:30am   CORONARY ARTERY BYPASS GRAFT  2004   triple   DOPPLER ECHOCARDIOGRAPHY  2010   NM MYOVIEW LTD  2012   TONSILLECTOMY     as a child   Patient Active Problem List   Diagnosis Date Noted   Peripheral neuropathy 05/23/2022   Lumbar spondylosis 05/23/2022   Gait abnormality 02/10/2022   Paresthesia 02/10/2022   Weakness 02/10/2022   Irregular heartbeat 05/04/2019    Diverticula of colon 04/13/2017   Internal hemorrhoids 04/13/2017   Rectal bleeding 01/06/2017   History of arteriosclerotic cardiovascular disease 01/06/2017   Coronary artery disease 07/06/2013   Carotid artery disease ([place]) 07/06/2013   Essential hypertension 07/06/2013   Hyperlipidemia 07/06/2013    PCP: Redmond School MD  REFERRING PROVIDER: Marcial Pacas, MD  REFERRING DIAG: R26.9 (ICD-10-CM) - Gait abnormality G62.89 (ICD-10-CM) - Other polyneuropathy M47.816 (ICD-10-CM) - Lumbar spondylosis  Rationale for Evaluation and Treatment: Rehabilitation  THERAPY DIAG:  Other abnormalities of gait and mobility  Muscle weakness (generalized)  ONSET DATE: 1 year  SUBJECTIVE STATEMENT: went to the [facility] yesterday and checked out the equipment; signed up through [employer]. Reports no pain today; plans to continue at [facility] after d/c from therapy   PERTINENT HISTORY:  CAD, HTN, HLD, Hx MI  PAIN:  Are you having pain? No  PRECAUTIONS: None  WEIGHT BEARING RESTRICTIONS: No  FALLS:  Has patient fallen in last 6 months? No  OCCUPATION: Retired  PLOF: Independent  PATIENT GOALS: move better, walk, exercise  NEXT MD VISIT: none scheduled  OBJECTIVE:   DIAGNOSTIC FINDINGS:  MRI 04/10/22: IMPRESSION: This MRI of the lumbar spine without contrast shows the following: At L2-L3, there is severe loss of disc height associated with mild retrolisthesis.  There is moderately severe right lateral recess stenosis with impingement of the right L3 nerve root.  Compared to the 2010 MRI, this level has slightly progressed. At L3-L4, there are degenerative changes causing moderately severe left lateral recess stenosis that could impinge upon the left L4 nerve root.  Degenerative changes at this level have  mildly progressed compared to the 2010 MRI. At L4-L5, there is a right paramedian disc protrusion and other degenerative change causing mild spinal stenosis and moderately severe left lateral recess stenosis which could impinge upon the left L5 nerve root.  Degenerative changes have mildly progressed at this level At L5-S1, there is very minimal retrolisthesis and other degenerative change but no spinal stenosis or nerve root compression.  Degenerative changes at this level have improved compared to the 2010 MRI with resolution of the right S1 nerve root impingement  POSTURE: decreased lumbar lordosis  PALPATION: Minimal soreness in lumbar paraspinals, not tenderness in glutes bilaterally  LUMBAR ROM:   AROM eval  Flexion 50% limited  Extension 50% limited  Right lateral flexion 50% limited  Left lateral flexion 50% limited  Right rotation   Left rotation    (Blank rows = not tested)  LOWER EXTREMITY ROM:   Lacks TKE on L    LOWER EXTREMITY MMT:    MMT Right eval Left eval  Hip flexion 4+ 4  Hip extension 4- 3+  Hip abduction 3+ 3+  Hip adduction    Hip internal rotation    Hip external rotation    Knee flexion 5 4+  Knee extension 5 4+  Ankle dorsiflexion 5 4+  Ankle plantarflexion    Ankle inversion    Ankle eversion     (Blank rows = not tested)   FUNCTIONAL TESTS:  5 times sit to stand: 16.73 seconds without UE support 2 minute walk test: 275  GAIT: Distance walked: 275 feet  Assistive device utilized: None Level of assistance: Complete Independence Comments: 2MWT, mild Trendelenburg on L, trunk slightly flexed, mild fatigue following  TODAY'S TREATMENT:                                                                                                                              DATE:  06/18/22 Nustep  seat 11 arms 8 level 4 x 5' dynamic warm up  Standing: Heel/toe raises x 20 Slant board 5 x 20" Mini squats 2 x 10 Hip abduction GTB around mid shin x 10  each Hip extension GTB around mid shin x 10 each 6" step ups x 10 each no UE assist Bodycraft leg press 4 plates x 10  Sit to stand no UE assist 2 x 5  Seated: Hamstring stretch 5 x 20" each    06/16/22 Standing: Heel/toe raises 2 x 10 Slant board 5 x 20" Mini squat 2 x 10 Tandem stance 2 x 30" each 6" step ups x 10 each no UE assist Hip abduction GTB around mid shin x 10 each  Seated: Hamstring stretch with strap 3 x 20"  Nustep seat 11 arms 8 level 3 x 5'      06/12/2022 Nustep seat 11 arms 8 level 3 x 5' for dynamic warm up Seated hamstring stretch x 30" x 3  Standing: Gastrocnemius slant board stretch x 30" x 3 Heel/toe raises x 20 Hip vectors, GTB x 10 each Mini squats x 3" x 10 x 2 6" step ups x 10 x 2, no UE assist  06/10/2022 Nustep seat 11 arms 8 level 2 x 5' for dynamic warm up  Standing: Heel/toe raises x 20 Hip vectors 3" hold x 5 each Squats to chair for target x 10 Tandem stance 2 x 30" each way 6" step ups x 8 each; right no UE assist; left 1 UE assist 4" lateral step ups x 8 each with 1 UE assist   06/04/2022 4: minute walk with 2 rest breaks Breast stroke against wall x 10: I Y to W Hip excursion x 3  Side step x 2 RT  Heel raise x 10  Mini squat x 10 Side-lying :  Clam x 10 both Hip abduction x 10  Supine: Bridge with hip adduction 5" hold  x 15    06/02/22 Treatment began with a 4:00 minute walk Hip excursion x 3 Seated piriformis stretch 1 rep x 10 seconds ( to difficult for patient at this time) Sitting tall x 5" x 3  Supine: Ab set x 10  Bridge x 5 Resisted clam with green theraband  x 10 Knee of chest 3 x 30" 05/28/22 Seated trunk flexion stretch 10 x 5 second holds Standing hip abduction 1 x 10 bilateral  Standing hip extension 1 x 10 bilateral    PATIENT EDUCATION: updated HEP  Education details: Patient educated on exam findings, POC, scope of PT, HEP, and general exercise. Person educated: Patient Education  method: Explanation, Demonstration, and Handouts Education comprehension: verbalized understanding, returned demonstration, verbal cues required, and tactile cues required  HOME EXERCISE PROGRAM: Access Code: [email or web address] URL: [email or web address] Date: 06/12/2022 Prepared by: Rexene Alberts  Exercises - Seated Hamstring Stretch  - 2 x daily - 7 x weekly - 3 reps - 30 hold  06/10/22 tandem stance  Access Code:  URL: [email or web address] 06/[email or web address]/2022  Side Stepping with Counter Support  - 1 x daily - 7 x weekly - 2 sets - 10 reps - Heel Raises with Counter Support  - 1 x daily - 7 x weekly - 2 sets - 10 reps - Mini Squat with Counter Support  - 1 x daily - 7 x weekly - 2 sets - 10 rep  Date: 06/02/2022 Prepared by: Rayetta Humphrey  Exercises - Supine Transversus Abdominis Bracing - Hands on Stomach  - 2 x  daily - 7 x weekly - 1 sets - 10 reps - 5" hold - Beginner Bridge  - 2 x daily - 7 x weekly - 1 sets - 10 reps - 5" hold - Hooklying Clamshell with Resistance  - 2 x daily - 7 x weekly - 1 sets - 10 reps - 5" hold - Supine Single Knee to Chest Stretch  - 1 x daily - 7 x weekly - 1 sets - 3 reps - 30" hold - Seated Upright Posture Correction  - 1 x daily - 7 x weekly - 1 sets - 3 reps - 5" hold Date: 05/28/2022 - Seated Flexion Stretch  - 2-3 x daily - 7 x weekly - 10 reps - 5 second hold - Standing Hip Abduction with Counter Support  - 2-3 x daily - 7 x weekly - 2 sets - 10 reps - Standing Hip Extension with Counter Support  - 2-3 x daily - 7 x weekly - 3 sets - 10 reps  ASSESSMENT:  CLINICAL IMPRESSION: Today's session continued to work on lower extremity and core strengthening and balance. Patient still needs cues for proper squat form as he tends to let his knees shoot forward. Added hip extension with GTB with challenge; needs cues to maintain upright posturing with hip extension to avoid substitution. Added bodycraft leg press today for strengthening  and to prep for gym program. Patient able to perform sit to stand without using Ue's to assist today.  Overall progressing well.   To date, skilled PT is required to address the impairments and improve function.   OBJECTIVE IMPAIRMENTS: Abnormal gait, decreased activity tolerance, decreased balance, decreased endurance, decreased mobility, difficulty walking, decreased ROM, decreased strength, improper body mechanics, postural dysfunction, and pain.   ACTIVITY LIMITATIONS: carrying, lifting, bending, standing, squatting, stairs, transfers, locomotion level, and caring for others  PARTICIPATION LIMITATIONS: meal prep, cleaning, laundry, shopping, community activity, and yard work  PERSONAL FACTORS: Age, Time since onset of injury/illness/exacerbation, and 3+ comorbidities: CAD, HTN, HLD, Hx MI, lumbar spondylosis  are also affecting patient's functional outcome.   REHAB POTENTIAL: Good  CLINICAL DECISION MAKING: Stable/uncomplicated  EVALUATION COMPLEXITY: Low   GOALS: Goals reviewed with patient? Yes  SHORT TERM GOALS: Target date: 06/18/2022    Patient will be independent with HEP in order to improve functional outcomes. Baseline:  Goal status: IN PROGRESS  2.  Patient will report at least 25% improvement in symptoms for improved quality of life. Baseline:  Goal status: IN PROGRESS   LONG TERM GOALS: Target Date: 07/09/2022    Patient will report at least 75% improvement in symptoms for improved quality of life. Baseline:  Goal status: IN PROGRESS  2.  Patient will be able to complete 5x STS in under 11.4 seconds in order to reduce the risk of falls. Baseline: 16.73 seconds without UE support Goal status: IN PROGRESS  3.  Patient will demonstrate at least 25% improvement in lumbar ROM in all restricted planes for improved ability to move trunk while completing chores. Baseline: see above Goal status: IN PROGRESS  4.  Patient will be able to ambulate at least 400 feet in  2MWT in order to demonstrate improved tolerance to activity. Baseline: 275 feet Goal status: IN PROGRESS  5.  Patient will demonstrate grade of 5/5 MMT grade in all tested musculature as evidence of improved strength to assist with stair ambulation and gait.   Baseline: see above Goal status: IN PROGRESS   PLAN:  PT FREQUENCY: 2x/week  PT DURATION: 6 weeks  PLANNED INTERVENTIONS: Therapeutic exercises, Therapeutic activity, Neuromuscular re-education, Balance training, Gait training, Patient/Family education, Joint manipulation, Joint mobilization, Stair training, Orthotic/Fit training, DME instructions, Aquatic Therapy, Dry Needling, Electrical stimulation, Spinal manipulation, Spinal mobilization, Cryotherapy, Moist heat, Compression bandaging, scar mobilization, Splintting, Taping, Traction, Ultrasound, Ionotophoresis '4mg'$ /ml Dexamethasone, and Manual therapy  PLAN FOR NEXT SESSION: f/u with HEP, glute and core strength, lumbar mobility, LE strength/functional strength; educate on proper exercises/gym routines/ walking program as indicated  8:56 AM, 06/18/22 Matie Dimaano Small Trachelle Low MPT [facility] [place] [phone] [phone]

## 2022-06-25 ENCOUNTER — Ambulatory Visit (HOSPITAL_COMMUNITY): Payer: Medicare HMO

## 2022-06-25 DIAGNOSIS — R2689 Other abnormalities of gait and mobility: Secondary | ICD-10-CM

## 2022-06-25 DIAGNOSIS — M6281 Muscle weakness (generalized): Secondary | ICD-10-CM

## 2022-06-25 NOTE — Therapy (Addendum)
OUTPATIENT PHYSICAL THERAPY THORACOLUMBAR treatment   Patient Name: Jason Johnson MRN: [id number] DOB:02/23/1941, 82 y.o., male Today's Date: 06/25/2022  END OF SESSION:   PT End of Session - 06/25/22 0826     Visit Number 8    Number of Visits 12    Date for PT Re-Evaluation 07/09/22    Authorization Type [employer] (no VL, no auth)    Progress Note Due on Visit 10    PT Start Time 0820    PT Stop Time 0900    PT Time Calculation (min) 40 min    Activity Tolerance Patient tolerated treatment well    Behavior During Therapy [facility] for tasks assessed/performed               Past Medical History:  Diagnosis Date   Carotid artery disease ([place])    Coronary artery disease    history of coronary artery bypass grafting in 1999   Gout    Hyperlipidemia    Hypertension    Myocardial infarction [facility]) 2009   Past Surgical History:  Procedure Laterality Date   CARDIAC CATHETERIZATION  2009   CATARACT EXTRACTION W/PHACO Right 09/09/2012   Procedure: CATARACT EXTRACTION PHACO AND INTRAOCULAR LENS PLACEMENT (IOC);  Surgeon: Tonny Branch, MD;  Location: [facility];  Service: Ophthalmology;  Laterality: Right;  CDE 14.48   CATARACT EXTRACTION W/PHACO Left 10/07/2012   Procedure: CATARACT EXTRACTION PHACO AND INTRAOCULAR LENS PLACEMENT (IOC);  Surgeon: Tonny Branch, MD;  Location: [facility];  Service: Ophthalmology;  Laterality: Left;  CDE:12.24   COLONOSCOPY     COLONOSCOPY N/A 02/11/2017   Procedure: COLONOSCOPY;  Surgeon: Daneil Dolin, MD;  Location: [facility] SU[facility];  Service: Endoscopy;  Laterality: N/A;  7:30am   CORONARY ARTERY BYPASS GRAFT  2004   triple   DOPPLER ECHOCARDIOGRAPHY  2010   NM MYOVIEW LTD  2012   TONSILLECTOMY     as a child   Patient Active Problem List   Diagnosis Date Noted   Peripheral neuropathy 05/23/2022   Lumbar spondylosis 05/23/2022   Gait abnormality 02/10/2022   Paresthesia 02/10/2022   Weakness 02/10/2022   Irregular heartbeat 05/04/2019    Diverticula of colon 04/13/2017   Internal hemorrhoids 04/13/2017   Rectal bleeding 01/06/2017   History of arteriosclerotic cardiovascular disease 01/06/2017   Coronary artery disease 07/06/2013   Carotid artery disease ([place]) 07/06/2013   Essential hypertension 07/06/2013   Hyperlipidemia 07/06/2013    PCP: Redmond School MD  REFERRING PROVIDER: Marcial Pacas, MD  REFERRING DIAG: R26.9 (ICD-10-CM) - Gait abnormality G62.89 (ICD-10-CM) - Other polyneuropathy M47.816 (ICD-10-CM) - Lumbar spondylosis  Rationale for Evaluation and Treatment: Rehabilitation  THERAPY DIAG:  Other abnormalities of gait and mobility  Muscle weakness (generalized)  ONSET DATE: 1 year  SUBJECTIVE STATEMENT: Doing well today but reports that his knees are hurting = 2/10  PERTINENT HISTORY:  CAD, HTN, HLD, Hx MI  PAIN:  Are you having pain? No  PRECAUTIONS: None  WEIGHT BEARING RESTRICTIONS: No  FALLS:  Has patient fallen in last 6 months? No  OCCUPATION: Retired  PLOF: Independent  PATIENT GOALS: move better, walk, exercise  NEXT MD VISIT: none scheduled  OBJECTIVE:   DIAGNOSTIC FINDINGS:  MRI 04/10/22: IMPRESSION: This MRI of the lumbar spine without contrast shows the following: At L2-L3, there is severe loss of disc height associated with mild retrolisthesis.  There is moderately severe right lateral recess stenosis with impingement of the right L3 nerve root.  Compared to the 2010 MRI, this level has slightly progressed. At L3-L4, there are degenerative changes causing moderately severe left lateral recess stenosis that could impinge upon the left L4 nerve root.  Degenerative changes at this level have mildly progressed compared to the 2010 MRI. At L4-L5, there is a right paramedian disc protrusion and  other degenerative change causing mild spinal stenosis and moderately severe left lateral recess stenosis which could impinge upon the left L5 nerve root.  Degenerative changes have mildly progressed at this level At L5-S1, there is very minimal retrolisthesis and other degenerative change but no spinal stenosis or nerve root compression.  Degenerative changes at this level have improved compared to the 2010 MRI with resolution of the right S1 nerve root impingement  POSTURE: decreased lumbar lordosis  PALPATION: Minimal soreness in lumbar paraspinals, not tenderness in glutes bilaterally  LUMBAR ROM:   AROM eval  Flexion 50% limited  Extension 50% limited  Right lateral flexion 50% limited  Left lateral flexion 50% limited  Right rotation   Left rotation    (Blank rows = not tested)  LOWER EXTREMITY ROM:   Lacks TKE on L    LOWER EXTREMITY MMT:    MMT Right eval Left eval  Hip flexion 4+ 4  Hip extension 4- 3+  Hip abduction 3+ 3+  Hip adduction    Hip internal rotation    Hip external rotation    Knee flexion 5 4+  Knee extension 5 4+  Ankle dorsiflexion 5 4+  Ankle plantarflexion    Ankle inversion    Ankle eversion     (Blank rows = not tested)   FUNCTIONAL TESTS:  5 times sit to stand: 16.73 seconds without UE support 2 minute walk test: 275  GAIT: Distance walked: 275 feet  Assistive device utilized: None Level of assistance: Complete Independence Comments: 2MWT, mild Trendelenburg on L, trunk slightly flexed, mild fatigue following  TODAY'S TREATMENT:                                                                                                                              DATE:  06/25/22 Nustep seat 11 arms 8 level 4 x 5' dynamic warm up  Standing: Heel/toe raises x 10  x 2 Gastrocnemius Slant board 5 x 30" Forward stepping over 4" hurdles, alt pattern x 5 rounds inside // bars, no UE assist Side stepping over 4" hurdles, alt pattern x 5 rounds  inside // bars, no UE assist Sit to stand, standard seat height, no UE assist 2 x 5  Seated: Hamstring stretch 3 x 30" each  06/18/22  Nustep seat 11 arms 8 level 4 x 5' dynamic warm up  Standing: Heel/toe raises x 20 Slant board 5 x 20" Mini squats 2 x 10 Hip abduction GTB around mid shin x 10 each Hip extension GTB around mid shin x 10 each 6" step ups x 10 each no UE assist [employer] leg press 4 plates x 10  Sit to stand no UE assist 2 x 5  Seated: Hamstring stretch 5 x 20" each    06/16/22 Standing: Heel/toe raises 2 x 10 Slant board 5 x 20" Mini squat 2 x 10 Tandem stance 2 x 30" each 6" step ups x 10 each no UE assist Hip abduction GTB around mid shin x 10 each  Seated: Hamstring stretch with strap 3 x 20"  Nustep seat 11 arms 8 level 3 x 5'      06/12/2022 Nustep seat 11 arms 8 level 3 x 5' for dynamic warm up Seated hamstring stretch x 30" x 3  Standing: Gastrocnemius slant board stretch x 30" x 3 Heel/toe raises x 20 Hip vectors, GTB x 10 each Mini squats x 3" x 10 x 2 6" step ups x 10 x 2, no UE assist  06/10/2022 Nustep seat 11 arms 8 level 2 x 5' for dynamic warm up  Standing: Heel/toe raises x 20 Hip vectors 3" hold x 5 each Squats to chair for target x 10 Tandem stance 2 x 30" each way 6" step ups x 8 each; right no UE assist; left 1 UE assist 4" lateral step ups x 8 each with 1 UE assist   06/04/2022 4: minute walk with 2 rest breaks Breast stroke against wall x 10: I Y to W Hip excursion x 3  Side step x 2 RT  Heel raise x 10  Mini squat x 10 Side-lying :  Clam x 10 both Hip abduction x 10  Supine: Bridge with hip adduction 5" hold  x 15    06/02/22 Treatment began with a 4:00 minute walk Hip excursion x 3 Seated piriformis stretch 1 rep x 10 seconds ( to difficult for patient at this time) Sitting tall x 5" x 3  Supine: Ab set x 10  Bridge x 5 Resisted clam with green theraband  x 10 Knee of chest 3 x  30" 05/28/22 Seated trunk flexion stretch 10 x 5 second holds Standing hip abduction 1 x 10 bilateral  Standing hip extension 1 x 10 bilateral    PATIENT EDUCATION: updated HEP  Education details: Patient educated on exam findings, POC, scope of PT, HEP, and general exercise. Person educated: Patient Education method: Explanation, Demonstration, and Handouts Education comprehension: verbalized understanding, returned demonstration, verbal cues required, and tactile cues required  HOME EXERCISE PROGRAM: Access Code: [email or web address] URL: [email or web address] Date: 06/12/2022 Prepared by: Rexene Alberts  Exercises - Seated Hamstring Stretch  - 2 x daily - 7 x weekly - 3 reps - 30 hold  06/10/22 tandem stance  Access Code:  URL: [email or web address] 06/[email or web address]/2022  Side Stepping with Counter Support  - 1 x daily - 7 x weekly - 2 sets - 10 reps -  Heel Raises with Counter Support  - 1 x daily - 7 x weekly - 2 sets - 10 reps - Mini Squat with Counter Support  - 1 x daily - 7 x weekly - 2 sets - 10 rep  Date: 06/02/2022 Prepared by: Rayetta Humphrey  Exercises - Supine Transversus Abdominis Bracing - Hands on Stomach  - 2 x daily - 7 x weekly - 1 sets - 10 reps - 5" hold - Beginner Bridge  - 2 x daily - 7 x weekly - 1 sets - 10 reps - 5" hold - Hooklying Clamshell with Resistance  - 2 x daily - 7 x weekly - 1 sets - 10 reps - 5" hold - Supine Single Knee to Chest Stretch  - 1 x daily - 7 x weekly - 1 sets - 3 reps - 30" hold - Seated Upright Posture Correction  - 1 x daily - 7 x weekly - 1 sets - 3 reps - 5" hold Date: 05/28/2022 - Seated Flexion Stretch  - 2-3 x daily - 7 x weekly - 10 reps - 5 second hold - Standing Hip Abduction with Counter Support  - 2-3 x daily - 7 x weekly - 2 sets - 10 reps - Standing Hip Extension with Counter Support  - 2-3 x daily - 7 x weekly - 3 sets - 10 reps  ASSESSMENT:  CLINICAL IMPRESSION: Interventions today were geared towards  LE functional strengthening and flexibility. Tolerated all activities without worsening of symptoms. Demonstrated mild levels of fatigue. Rest periods provided. Provided slight to mild amount of cueing to ensure correct execution of activity with fair to good carry-over. Slight to mild unsteadiness noted in stepping over hurdles due to weakness and impaired proprioception. To date, skilled PT is required to address the impairments and improve function.  OBJECTIVE IMPAIRMENTS: Abnormal gait, decreased activity tolerance, decreased balance, decreased endurance, decreased mobility, difficulty walking, decreased ROM, decreased strength, improper body mechanics, postural dysfunction, and pain.   ACTIVITY LIMITATIONS: carrying, lifting, bending, standing, squatting, stairs, transfers, locomotion level, and caring for others  PARTICIPATION LIMITATIONS: meal prep, cleaning, laundry, shopping, community activity, and yard work  PERSONAL FACTORS: Age, Time since onset of injury/illness/exacerbation, and 3+ comorbidities: CAD, HTN, HLD, Hx MI, lumbar spondylosis  are also affecting patient's functional outcome.   REHAB POTENTIAL: Good  CLINICAL DECISION MAKING: Stable/uncomplicated  EVALUATION COMPLEXITY: Low   GOALS: Goals reviewed with patient? Yes  SHORT TERM GOALS: Target date: 06/18/2022    Patient will be independent with HEP in order to improve functional outcomes. Baseline:  Goal status: IN PROGRESS  2.  Patient will report at least 25% improvement in symptoms for improved quality of life. Baseline:  Goal status: IN PROGRESS   LONG TERM GOALS: Target Date: 07/09/2022    Patient will report at least 75% improvement in symptoms for improved quality of life. Baseline:  Goal status: IN PROGRESS  2.  Patient will be able to complete 5x STS in under 11.4 seconds in order to reduce the risk of falls. Baseline: 16.73 seconds without UE support Goal status: IN PROGRESS  3.  Patient will  demonstrate at least 25% improvement in lumbar ROM in all restricted planes for improved ability to move trunk while completing chores. Baseline: see above Goal status: IN PROGRESS  4.  Patient will be able to ambulate at least 400 feet in 2MWT in order to demonstrate improved tolerance to activity. Baseline: 275 feet Goal status: IN PROGRESS  5.  Patient  will demonstrate grade of 5/5 MMT grade in all tested musculature as evidence of improved strength to assist with stair ambulation and gait.   Baseline: see above Goal status: IN PROGRESS   PLAN:  PT FREQUENCY: 2x/week  PT DURATION: 6 weeks  PLANNED INTERVENTIONS: Therapeutic exercises, Therapeutic activity, Neuromuscular re-education, Balance training, Gait training, Patient/Family education, Joint manipulation, Joint mobilization, Stair training, Orthotic/Fit training, DME instructions, Aquatic Therapy, Dry Needling, Electrical stimulation, Spinal manipulation, Spinal mobilization, Cryotherapy, Moist heat, Compression bandaging, scar mobilization, Splintting, Taping, Traction, Ultrasound, Ionotophoresis 4mg /ml Dexamethasone, and Manual therapy  PLAN FOR NEXT SESSION: f/u with HEP, glute and core strength, lumbar mobility, LE strength/functional strength; educate on proper exercises/gym routines/ walking program as indicated  8:27 AM, 06/25/22 Chrissie Noa L. Cannie Muckle, PT, DPT, OCS Board-Certified Clinical Specialist in [place] # ([place]): [id number] T

## 2022-06-27 ENCOUNTER — Ambulatory Visit (HOSPITAL_COMMUNITY): Payer: Medicare HMO

## 2022-06-27 DIAGNOSIS — R2689 Other abnormalities of gait and mobility: Secondary | ICD-10-CM

## 2022-06-27 DIAGNOSIS — M6281 Muscle weakness (generalized): Secondary | ICD-10-CM

## 2022-06-27 NOTE — Therapy (Signed)
OUTPATIENT PHYSICAL THERAPY THORACOLUMBAR treatment   Patient Name: Jason Johnson MRN: [id number] DOB:24-Feb-1941, 82 y.o., male Today's Date: 06/27/2022  END OF SESSION:   PT End of Session - 06/27/22 1301     Visit Number 9    Number of Visits 12    Date for PT Re-Evaluation 07/09/22    Authorization Type [employer] (no VL, no auth)    Progress Note Due on Visit 10    PT Start Time 1300    PT Stop Time 1341    PT Time Calculation (min) 41 min    Activity Tolerance Patient tolerated treatment well    Behavior During Therapy WFL for tasks assessed/performed               Past Medical History:  Diagnosis Date   Carotid artery disease ([place])    Coronary artery disease    history of coronary artery bypass grafting in 1999   Gout    Hyperlipidemia    Hypertension    Myocardial infarction [facility]) 2009   Past Surgical History:  Procedure Laterality Date   CARDIAC CATHETERIZATION  2009   CATARACT EXTRACTION W/PHACO Right 09/09/2012   Procedure: CATARACT EXTRACTION PHACO AND INTRAOCULAR LENS PLACEMENT ([place]);  Surgeon: Tonny Branch, MD;  Location: [facility];  Service: Ophthalmology;  Laterality: Right;  CDE 14.48   CATARACT EXTRACTION W/PHACO Left 10/07/2012   Procedure: CATARACT EXTRACTION PHACO AND INTRAOCULAR LENS PLACEMENT ([facility]);  Surgeon: Tonny Branch, MD;  Location: [facility];  Service: Ophthalmology;  Laterality: Left;  CDE:12.24   COLONOSCOPY     COLONOSCOPY N/A 02/11/2017   Procedure: COLONOSCOPY;  Surgeon: Daneil Dolin, MD;  Location: [facility] SU[facility];  Service: Endoscopy;  Laterality: N/A;  7:30am   CORONARY ARTERY BYPASS GRAFT  2004   triple   DOPPLER ECHOCARDIOGRAPHY  2010   NM MYOVIEW LTD  2012   TONSILLECTOMY     as a child   Patient Active Problem List   Diagnosis Date Noted   Peripheral neuropathy 05/23/2022   Lumbar spondylosis 05/23/2022   Gait abnormality 02/10/2022   Paresthesia 02/10/2022   Weakness 02/10/2022   Irregular heartbeat 05/04/2019    Diverticula of colon 04/13/2017   Internal hemorrhoids 04/13/2017   Rectal bleeding 01/06/2017   History of arteriosclerotic cardiovascular disease 01/06/2017   Coronary artery disease 07/06/2013   Carotid artery disease ([place]) 07/06/2013   Essential hypertension 07/06/2013   Hyperlipidemia 07/06/2013    PCP: Redmond School MD  REFERRING PROVIDER: Marcial Pacas, MD  REFERRING DIAG: R26.9 (ICD-10-CM) - Gait abnormality G62.89 (ICD-10-CM) - Other polyneuropathy M47.816 (ICD-10-CM) - Lumbar spondylosis  Rationale for Evaluation and Treatment: Rehabilitation  THERAPY DIAG:  Other abnormalities of gait and mobility  Muscle weakness (generalized)  ONSET DATE: 1 year  SUBJECTIVE STATEMENT: no new pain; "knees hurt a little bit"; my walking is getting easier and my balance is getting better; states he does have neuropathy in both feet and some in lower legs.  PERTINENT HISTORY:  CAD, HTN, HLD, Hx MI  PAIN:  Are you having pain? No  PRECAUTIONS: None  WEIGHT BEARING RESTRICTIONS: No  FALLS:  Has patient fallen in last 6 months? No  OCCUPATION: Retired  PLOF: Independent  PATIENT GOALS: move better, walk, exercise  NEXT MD VISIT: none scheduled  OBJECTIVE:   DIAGNOSTIC FINDINGS:  MRI 04/10/22: IMPRESSION: This MRI of the lumbar spine without contrast shows the following: At L2-L3, there is severe loss of disc height associated with mild retrolisthesis.  There is moderately severe right lateral recess stenosis with impingement of the right L3 nerve root.  Compared to the 2010 MRI, this level has slightly progressed. At L3-L4, there are degenerative changes causing moderately severe left lateral recess stenosis that could impinge upon the left L4 nerve root.  Degenerative changes at this level have  mildly progressed compared to the 2010 MRI. At L4-L5, there is a right paramedian disc protrusion and other degenerative change causing mild spinal stenosis and moderately severe left lateral recess stenosis which could impinge upon the left L5 nerve root.  Degenerative changes have mildly progressed at this level At L5-S1, there is very minimal retrolisthesis and other degenerative change but no spinal stenosis or nerve root compression.  Degenerative changes at this level have improved compared to the 2010 MRI with resolution of the right S1 nerve root impingement  POSTURE: decreased lumbar lordosis  PALPATION: Minimal soreness in lumbar paraspinals, not tenderness in glutes bilaterally  LUMBAR ROM:   AROM eval  Flexion 50% limited  Extension 50% limited  Right lateral flexion 50% limited  Left lateral flexion 50% limited  Right rotation   Left rotation    (Blank rows = not tested)  LOWER EXTREMITY ROM:   Lacks TKE on L    LOWER EXTREMITY MMT:    MMT Right eval Left eval  Hip flexion 4+ 4  Hip extension 4- 3+  Hip abduction 3+ 3+  Hip adduction    Hip internal rotation    Hip external rotation    Knee flexion 5 4+  Knee extension 5 4+  Ankle dorsiflexion 5 4+  Ankle plantarflexion    Ankle inversion    Ankle eversion     (Blank rows = not tested)   FUNCTIONAL TESTS:  5 times sit to stand: 16.73 seconds without UE support 2 minute walk test: 275  GAIT: Distance walked: 275 feet  Assistive device utilized: None Level of assistance: Complete Independence Comments: 2MWT, mild Trendelenburg on L, trunk slightly flexed, mild fatigue following  TODAY'S TREATMENT:                                                                                                                              DATE:  06/27/22 Nustep seat 11 arms 8 level 4 x 5' dynamic warm up  Seated: Hamstring stretch 10" x 5 each  Standing: Heel/toe raises x 20 Slant board 5 x 20" Tandem stance on  foam 2 x 30" each Hip vectors 2" x 5 each Rocker board F/B and S/S x 1' each; CGA for safety [employer] backwards walkouts 4 plates x 10  Sit to stand x 10 no UE assist   06/25/22 Nustep seat 11 arms 8 level 4 x 5' dynamic warm up  Standing: Heel/toe raises x 10 x 2 Gastrocnemius Slant board 5 x 30" Forward stepping over 4" hurdles, alt pattern x 5 rounds inside // bars, no UE assist Side stepping over 4" hurdles, alt pattern x 5 rounds inside // bars, no UE assist Sit to stand, standard seat height, no UE assist 2 x 5  Seated: Hamstring stretch 3 x 30" each  06/18/22  Nustep seat 11 arms 8 level 4 x 5' dynamic warm up  Standing: Heel/toe raises x 20 Slant board 5 x 20" Mini squats 2 x 10 Hip abduction GTB around mid shin x 10 each Hip extension GTB around mid shin x 10 each 6" step ups x 10 each no UE assist Bodycraft leg press 4 plates x 10  Sit to stand no UE assist 2 x 5  Seated: Hamstring stretch 5 x 20" each    06/16/22 Standing: Heel/toe raises 2 x 10 Slant board 5 x 20" Mini squat 2 x 10 Tandem stance 2 x 30" each 6" step ups x 10 each no UE assist Hip abduction GTB around mid shin x 10 each  Seated: Hamstring stretch with strap 3 x 20"  Nustep seat 11 arms 8 level 3 x 5'      06/12/2022 Nustep seat 11 arms 8 level 3 x 5' for dynamic warm up Seated hamstring stretch x 30" x 3  Standing: Gastrocnemius slant board stretch x 30" x 3 Heel/toe raises x 20 Hip vectors, GTB x 10 each Mini squats x 3" x 10 x 2 6" step ups x 10 x 2, no UE assist  06/10/2022 Nustep seat 11 arms 8 level 2 x 5' for dynamic warm up  Standing: Heel/toe raises x 20 Hip vectors 3" hold x 5 each Squats to chair for target x 10 Tandem stance 2 x 30" each way 6" step ups x 8 each; right no UE assist; left 1 UE assist 4" lateral step ups x 8 each with 1 UE assist   06/04/2022 4: minute walk with 2 rest breaks Breast stroke against wall x 10: I Y to W Hip excursion x 3   Side step x 2 RT  Heel raise x 10  Mini squat x 10 Side-lying :  Clam x 10 both Hip abduction x 10  Supine: Bridge with hip adduction 5" hold  x 15    06/02/22 Treatment began with a 4:00 minute walk Hip excursion x 3 Seated piriformis stretch 1 rep x 10 seconds ( to difficult for patient at this time) Sitting tall x 5" x 3  Supine: Ab set x 10  Bridge x 5 Resisted clam with green theraband  x 10 Knee of chest 3 x 30" 05/28/22 Seated trunk flexion stretch 10 x 5 second holds Standing hip abduction 1 x 10 bilateral  Standing hip extension 1 x 10 bilateral    PATIENT EDUCATION: updated HEP  Education details: Patient educated on exam findings, POC, scope of PT,  HEP, and general exercise. Person educated: Patient Education method: Explanation, Demonstration, and Handouts Education comprehension: verbalized understanding, returned demonstration, verbal cues required, and tactile cues required  HOME EXERCISE PROGRAM: Access Code: [email or web address] URL: [email or web address] Date: 06/12/2022 Prepared by: Rexene Alberts  Exercises - Seated Hamstring Stretch  - 2 x daily - 7 x weekly - 3 reps - 30 hold  06/10/22 tandem stance  Access Code:  URL: [email or web address] 06/04/2022  Side Stepping with Counter Support  - 1 x daily - 7 x weekly - 2 sets - 10 reps - Heel Raises with Counter Support  - 1 x daily - 7 x weekly - 2 sets - 10 reps - Mini Squat with Counter Support  - 1 x daily - 7 x weekly - 2 sets - 10 rep  Date: 06/02/2022 Prepared by: Rayetta Humphrey  Exercises - Supine Transversus Abdominis Bracing - Hands on Stomach  - 2 x daily - 7 x weekly - 1 sets - 10 reps - 5" hold - Beginner Bridge  - 2 x daily - 7 x weekly - 1 sets - 10 reps - 5" hold - Hooklying Clamshell with Resistance  - 2 x daily - 7 x weekly - 1 sets - 10 reps - 5" hold - Supine Single Knee to Chest Stretch  - 1 x daily - 7 x weekly - 1 sets - 3 reps - 30" hold - Seated Upright  Posture Correction  - 1 x daily - 7 x weekly - 1 sets - 3 reps - 5" hold Date: 05/28/2022 - Seated Flexion Stretch  - 2-3 x daily - 7 x weekly - 10 reps - 5 second hold - Standing Hip Abduction with Counter Support  - 2-3 x daily - 7 x weekly - 2 sets - 10 reps - Standing Hip Extension with Counter Support  - 2-3 x daily - 7 x weekly - 3 sets - 10 reps  ASSESSMENT:  CLINICAL IMPRESSION: Today's session focused on core and lower extremity strengthening and balance.  Discussed with patient how neuropathy effects balance with verbalized understanding. Added tandem stance on foam and rocker board for balance with good challenge; patient has to use upper extremities on parallel bars to assist with preventing loss of balance several times.  Added walkouts today no loss of balance noted.  To date, skilled PT is required to address the impairments and improve function.  OBJECTIVE IMPAIRMENTS: Abnormal gait, decreased activity tolerance, decreased balance, decreased endurance, decreased mobility, difficulty walking, decreased ROM, decreased strength, improper body mechanics, postural dysfunction, and pain.   ACTIVITY LIMITATIONS: carrying, lifting, bending, standing, squatting, stairs, transfers, locomotion level, and caring for others  PARTICIPATION LIMITATIONS: meal prep, cleaning, laundry, shopping, community activity, and yard work  PERSONAL FACTORS: Age, Time since onset of injury/illness/exacerbation, and 3+ comorbidities: CAD, HTN, HLD, Hx MI, lumbar spondylosis  are also affecting patient's functional outcome.   REHAB POTENTIAL: Good  CLINICAL DECISION MAKING: Stable/uncomplicated  EVALUATION COMPLEXITY: Low   GOALS: Goals reviewed with patient? Yes  SHORT TERM GOALS: Target date: 06/18/2022    Patient will be independent with HEP in order to improve functional outcomes. Baseline:  Goal status: IN PROGRESS  2.  Patient will report at least 25% improvement in symptoms for improved  quality of life. Baseline:  Goal status: IN PROGRESS   LONG TERM GOALS: Target Date: 07/09/2022    Patient will report at least 75% improvement in symptoms for improved quality of life. Baseline:  Goal status: IN PROGRESS  2.  Patient will be able to complete 5x STS in under 11.4 seconds in order to reduce the risk of falls. Baseline: 16.73 seconds without UE support Goal status: IN PROGRESS  3.  Patient will demonstrate at least 25% improvement in lumbar ROM in all restricted planes for improved ability to move trunk while completing chores. Baseline: see above Goal status: IN PROGRESS  4.  Patient will be able to ambulate at least 400 feet in 2MWT in order to demonstrate improved tolerance to activity. Baseline: 275 feet Goal status: IN PROGRESS  5.  Patient will demonstrate grade of 5/5 MMT grade in all tested musculature as evidence of improved strength to assist with stair ambulation and gait.   Baseline: see above Goal status: IN PROGRESS   PLAN:  PT FREQUENCY: 2x/week  PT DURATION: 6 weeks  PLANNED INTERVENTIONS: Therapeutic exercises, Therapeutic activity, Neuromuscular re-education, Balance training, Gait training, Patient/Family education, Joint manipulation, Joint mobilization, Stair training, Orthotic/Fit training, DME instructions, Aquatic Therapy, Dry Needling, Electrical stimulation, Spinal manipulation, Spinal mobilization, Cryotherapy, Moist heat, Compression bandaging, scar mobilization, Splintting, Taping, Traction, Ultrasound, Ionotophoresis 4mg /ml Dexamethasone, and Manual therapy  PLAN FOR NEXT SESSION: f/u with HEP, glute and core strength, lumbar mobility, LE strength/functional strength; educate on proper exercises/gym routines/ walking program as indicated  1:44 PM, 06/27/22 Leita Lindbloom Small Mckenze Slone MPT [facility]  [phone] Ph:[phone]

## 2022-07-01 ENCOUNTER — Encounter (HOSPITAL_COMMUNITY): Payer: Self-pay | Admitting: Physical Therapy

## 2022-07-01 ENCOUNTER — Ambulatory Visit (HOSPITAL_COMMUNITY): Payer: Medicare HMO | Admitting: Physical Therapy

## 2022-07-01 DIAGNOSIS — R2689 Other abnormalities of gait and mobility: Secondary | ICD-10-CM

## 2022-07-01 DIAGNOSIS — M6281 Muscle weakness (generalized): Secondary | ICD-10-CM

## 2022-07-01 NOTE — Therapy (Signed)
OUTPATIENT PHYSICAL THERAPY THORACOLUMBAR treatment   Patient Name: Jason Johnson MRN: [id number] DOB:1940/11/15, 82 y.o., male Today's Date: 07/01/2022  Progress Note   Reporting Period 05/28/22 to 07/01/22   See note below for Objective Data and Assessment of Progress/Goals   END OF SESSION:   PT End of Session - 07/01/22 1432     Visit Number 10    Number of Visits 12    Date for PT Re-Evaluation 07/09/22    Authorization Type [employer] (no VL, no auth)    Progress Note Due on Visit 10    PT Start Time [id number]    PT Stop Time 1510    PT Time Calculation (min) 38 min    Activity Tolerance Patient tolerated treatment well    Behavior During Therapy WFL for tasks assessed/performed               Past Medical History:  Diagnosis Date   Carotid artery disease ([place])    Coronary artery disease    history of coronary artery bypass grafting in 1999   Gout    Hyperlipidemia    Hypertension    Myocardial infarction [facility]) 2009   Past Surgical History:  Procedure Laterality Date   CARDIAC CATHETERIZATION  2009   CATARACT EXTRACTION W/PHACO Right 09/09/2012   Procedure: CATARACT EXTRACTION PHACO AND INTRAOCULAR LENS PLACEMENT ([place]);  Surgeon: Tonny Branch, MD;  Location: [facility];  Service: Ophthalmology;  Laterality: Right;  CDE 14.48   CATARACT EXTRACTION W/PHACO Left 10/07/2012   Procedure: CATARACT EXTRACTION PHACO AND INTRAOCULAR LENS PLACEMENT ([facility]);  Surgeon: Tonny Branch, MD;  Location: [facility];  Service: Ophthalmology;  Laterality: Left;  CDE:12.24   COLONOSCOPY     COLONOSCOPY N/A 02/11/2017   Procedure: COLONOSCOPY;  Surgeon: Daneil Dolin, MD;  Location: [facility];  Service: Endoscopy;  Laterality: N/A;  7:30am   CORONARY ARTERY BYPASS GRAFT  2004   triple   DOPPLER ECHOCARDIOGRAPHY  2010   NM MYOVIEW LTD  2012   TONSILLECTOMY     as a child   Patient Active Problem List   Diagnosis Date Noted   Peripheral neuropathy 05/23/2022   Lumbar spondylosis  05/23/2022   Gait abnormality 02/10/2022   Paresthesia 02/10/2022   Weakness 02/10/2022   Irregular heartbeat 05/04/2019   Diverticula of colon 04/13/2017   Internal hemorrhoids 04/13/2017   Rectal bleeding 01/06/2017   History of arteriosclerotic cardiovascular disease 01/06/2017   Coronary artery disease 07/06/2013   Carotid artery disease ([place]) 07/06/2013   Essential hypertension 07/06/2013   Hyperlipidemia 07/06/2013    PCP: Redmond School MD  REFERRING PROVIDER: Marcial Pacas, MD  REFERRING DIAG: R26.9 (ICD-10-CM) - Gait abnormality G62.89 (ICD-10-CM) - Other polyneuropathy M47.816 (ICD-10-CM) - Lumbar spondylosis  Rationale for Evaluation and Treatment: Rehabilitation  THERAPY DIAG:  Other abnormalities of gait and mobility  Muscle weakness (generalized)  ONSET DATE: 1 year  SUBJECTIVE STATEMENT: Patient feels like everything is going well. Notes improvement with balance and walking but balance is still a little off. Has not been getting as short of breath. Has noted decrease in low back ache. Patient has began going to [facility] over the last few days. His HEP is going well. Plans on going to [facility] 2-3x/week. Patient states 30-40% improvement with PT intervention. Feels remaninig deficit is in walking and would likely to walk about half a mile without knee and hip pain and being SOB. Patient would like to work on improving balance still. Notes most weakness with walking, getting off of floor if he has to get down, transfers.   PERTINENT HISTORY:  CAD, HTN, HLD, Hx MI  PAIN:  Are you having pain? No  PRECAUTIONS: None  WEIGHT BEARING RESTRICTIONS: No  FALLS:  Has patient fallen in last 6 months? No  OCCUPATION: Retired  PLOF: Independent  PATIENT GOALS: move better, walk,  exercise  NEXT MD VISIT: none scheduled  OBJECTIVE:   DIAGNOSTIC FINDINGS:  MRI 04/10/22: IMPRESSION: This MRI of the lumbar spine without contrast shows the following: At L2-L3, there is severe loss of disc height associated with mild retrolisthesis.  There is moderately severe right lateral recess stenosis with impingement of the right L3 nerve root.  Compared to the 2010 MRI, this level has slightly progressed. At L3-L4, there are degenerative changes causing moderately severe left lateral recess stenosis that could impinge upon the left L4 nerve root.  Degenerative changes at this level have mildly progressed compared to the 2010 MRI. At L4-L5, there is a right paramedian disc protrusion and other degenerative change causing mild spinal stenosis and moderately severe left lateral recess stenosis which could impinge upon the left L5 nerve root.  Degenerative changes have mildly progressed at this level At L5-S1, there is very minimal retrolisthesis and other degenerative change but no spinal stenosis or nerve root compression.  Degenerative changes at this level have improved compared to the 2010 MRI with resolution of the right S1 nerve root impingement  POSTURE: decreased lumbar lordosis  PALPATION: Minimal soreness in lumbar paraspinals, not tenderness in glutes bilaterally  LUMBAR ROM:   AROM eval 07/01/22  Flexion 50% limited 50% limited  Extension 50% limited 50% limited  Right lateral flexion 50% limited 25% limited  Left lateral flexion 50% limited 25% limited  Right rotation    Left rotation     (Blank rows = not tested)  LOWER EXTREMITY ROM:   Lacks TKE on L    LOWER EXTREMITY MMT:    MMT Right eval Left eval Right 07/01/22 Left 07/01/22  Hip flexion 4+ 4 5 5   Hip extension 4- 3+ 4 4-  Hip abduction 3+ 3+ 3+ 4  Hip adduction      Hip internal rotation      Hip external rotation      Knee flexion 5 4+ 5 5  Knee extension 5 4+ 5 5  Ankle dorsiflexion 5 4+ 5 5   Ankle plantarflexion      Ankle inversion      Ankle eversion       (Blank rows = not tested)   FUNCTIONAL TESTS:  5 times sit to stand: 16.73 seconds without UE support 2 minute walk test: 275  GAIT: Distance walked: 275 feet  Assistive device utilized: None Level of assistance: Complete Independence Comments: 2MWT, mild Trendelenburg on L, trunk slightly flexed, mild fatigue following   Reassessment 07/01/22 FUNCTIONAL TESTS:  5 times sit to stand: 11.18  seconds without UE support 2 minute walk test: 410 feet TODAY'S TREATMENT:                                                                                                                              DATE:  07/01/22 Reassessment  Discussed POC  06/27/22 Nustep seat 11 arms 8 level 4 x 5' dynamic warm up  Seated: Hamstring stretch 10" x 5 each  Standing: Heel/toe raises x 20 Slant board 5 x 20" Tandem stance on foam 2 x 30" each Hip vectors 2" x 5 each Rocker board F/B and S/S x 1' each; CGA for safety Bodycraft backwards walkouts 4 plates x 10  Sit to stand x 10 no UE assist   06/25/22 Nustep seat 11 arms 8 level 4 x 5' dynamic warm up  Standing: Heel/toe raises x 10 x 2 Gastrocnemius Slant board 5 x 30" Forward stepping over 4" hurdles, alt pattern x 5 rounds inside // bars, no UE assist Side stepping over 4" hurdles, alt pattern x 5 rounds inside // bars, no UE assist Sit to stand, standard seat height, no UE assist 2 x 5  Seated: Hamstring stretch 3 x 30" each  06/18/22  Nustep seat 11 arms 8 level 4 x 5' dynamic warm up  Standing: Heel/toe raises x 20 Slant board 5 x 20" Mini squats 2 x 10 Hip abduction GTB around mid shin x 10 each Hip extension GTB around mid shin x 10 each 6" step ups x 10 each no UE assist Bodycraft leg press 4 plates x 10  Sit to stand no UE assist 2 x 5  Seated: Hamstring stretch 5 x 20" each    06/16/22 Standing: Heel/toe raises 2 x 10 Slant board 5 x 20" Mini  squat 2 x 10 Tandem stance 2 x 30" each 6" step ups x 10 each no UE assist Hip abduction GTB around mid shin x 10 each  Seated: Hamstring stretch with strap 3 x 20"  Nustep seat 11 arms 8 level 3 x 5'      06/12/2022 Nustep seat 11 arms 8 level 3 x 5' for dynamic warm up Seated hamstring stretch x 30" x 3  Standing: Gastrocnemius slant board stretch x 30" x 3 Heel/toe raises x 20 Hip vectors, GTB x 10 each Mini squats x 3" x 10 x 2 6" step ups x 10 x 2, no UE assist  06/10/2022 Nustep seat 11 arms 8 level 2 x 5' for dynamic warm up  Standing: Heel/toe raises x 20 Hip vectors 3" hold x 5 each Squats to chair for target x 10 Tandem stance 2 x 30" each way 6" step ups x 8 each; right no UE assist; left 1 UE assist 4" lateral step ups x 8 each with 1 UE assist   06/04/2022 4: minute walk with 2 rest breaks Breast stroke against wall x 10:  I Y to W Hip excursion x 3  Side step x 2 RT  Heel raise x 10  Mini squat x 10 Side-lying :  Clam x 10 both Hip abduction x 10  Supine: Bridge with hip adduction 5" hold  x 15    06/02/22 Treatment began with a 4:00 minute walk Hip excursion x 3 Seated piriformis stretch 1 rep x 10 seconds ( to difficult for patient at this time) Sitting tall x 5" x 3  Supine: Ab set x 10  Bridge x 5 Resisted clam with green theraband  x 10 Knee of chest 3 x 30" 05/28/22 Seated trunk flexion stretch 10 x 5 second holds Standing hip abduction 1 x 10 bilateral  Standing hip extension 1 x 10 bilateral    PATIENT EDUCATION: updated HEP  Education details: Patient educated on exam findings, POC, scope of PT, HEP, and general exercise. Person educated: Patient Education method: Explanation, Demonstration, and Handouts Education comprehension: verbalized understanding, returned demonstration, verbal cues required, and tactile cues required  HOME EXERCISE PROGRAM: Access Code: [email or web address] URL: [email or web address]   Date:  06/12/2022 - Seated Hamstring Stretch  - 2 x daily - 7 x weekly - 3 reps - 30 hold  06/10/22 tandem stance  Access Code:  URL: [email or web address] 06/[email or web address]/2022  Side Stepping with Counter Support  - 1 x daily - 7 x weekly - 2 sets - 10 reps - Heel Raises with Counter Support  - 1 x daily - 7 x weekly - 2 sets - 10 reps - Mini Squat with Counter Support  - 1 x daily - 7 x weekly - 2 sets - 10 rep  Date: 06/02/2022 Prepared by: Rayetta Humphrey  Exercises - Supine Transversus Abdominis Bracing - Hands on Stomach  - 2 x daily - 7 x weekly - 1 sets - 10 reps - 5" hold - Beginner Bridge  - 2 x daily - 7 x weekly - 1 sets - 10 reps - 5" hold - Hooklying Clamshell with Resistance  - 2 x daily - 7 x weekly - 1 sets - 10 reps - 5" hold - Supine Single Knee to Chest Stretch  - 1 x daily - 7 x weekly - 1 sets - 3 reps - 30" hold - Seated Upright Posture Correction  - 1 x daily - 7 x weekly - 1 sets - 3 reps - 5" hold Date: 05/28/2022 - Seated Flexion Stretch  - 2-3 x daily - 7 x weekly - 10 reps - 5 second hold - Standing Hip Abduction with Counter Support  - 2-3 x daily - 7 x weekly - 2 sets - 10 reps - Standing Hip Extension with Counter Support  - 2-3 x daily - 7 x weekly - 3 sets - 10 reps  ASSESSMENT:  CLINICAL IMPRESSION: Patient has met 2/2 short term goals and 2/5 long term goals with ability to complete HEP and improvement in symptoms, strength, ROM, activity tolerance, gait, balance, and functional mobility. Remaining goals not met due to continued deficits in strength, symptoms, activity, and functional mobility. Patient has made good progress toward remaining goals. Discussed progress with patient and working to transition to Actor with HEP and going to [facility] Educated on beginning a walking program to improve walking ability and endurance. Patient will continue to benefit from skilled physical therapy in order to improve function and reduce impairment.    OBJECTIVE  IMPAIRMENTS: Abnormal gait, decreased activity tolerance, decreased balance, decreased endurance, decreased mobility, difficulty  walking, decreased ROM, decreased strength, improper body mechanics, postural dysfunction, and pain.   ACTIVITY LIMITATIONS: carrying, lifting, bending, standing, squatting, stairs, transfers, locomotion level, and caring for others  PARTICIPATION LIMITATIONS: meal prep, cleaning, laundry, shopping, community activity, and yard work  PERSONAL FACTORS: Age, Time since onset of injury/illness/exacerbation, and 3+ comorbidities: CAD, HTN, HLD, Hx MI, lumbar spondylosis  are also affecting patient's functional outcome.   REHAB POTENTIAL: Good  CLINICAL DECISION MAKING: Stable/uncomplicated  EVALUATION COMPLEXITY: Low   GOALS: Goals reviewed with patient? Yes  SHORT TERM GOALS: Target date: 06/18/2022    Patient will be independent with HEP in order to improve functional outcomes. Baseline:  Goal status: MET  2.  Patient will report at least 25% improvement in symptoms for improved quality of life. Baseline:  Goal status: MET   LONG TERM GOALS: Target Date: 07/09/2022    Patient will report at least 75% improvement in symptoms for improved quality of life. Baseline:  Goal status: IN PROGRESS  2.  Patient will be able to complete 5x STS in under 11.4 seconds in order to reduce the risk of falls. Baseline: 16.73 seconds without UE support 07/01/22 11.18  seconds without UE support Goal status: MET  3.  Patient will demonstrate at least 25% improvement in lumbar ROM in all restricted planes for improved ability to move trunk while completing chores. Baseline: see above Goal status: IN PROGRESS  4.  Patient will be able to ambulate at least 400 feet in 2MWT in order to demonstrate improved tolerance to activity. Baseline: 275 feet 07/01/22 410 feet Goal status: MET  5.  Patient will demonstrate grade of 5/5 MMT grade in all tested musculature as evidence  of improved strength to assist with stair ambulation and gait.   Baseline: see above  Goal status: IN PROGRESS   PLAN:  PT FREQUENCY: 2x/week  PT DURATION: 6 weeks  PLANNED INTERVENTIONS: Therapeutic exercises, Therapeutic activity, Neuromuscular re-education, Balance training, Gait training, Patient/Family education, Joint manipulation, Joint mobilization, Stair training, Orthotic/Fit training, DME instructions, Aquatic Therapy, Dry Needling, Electrical stimulation, Spinal manipulation, Spinal mobilization, Cryotherapy, Moist heat, Compression bandaging, scar mobilization, Splintting, Taping, Traction, Ultrasound, Ionotophoresis 4mg /ml Dexamethasone, and Manual therapy  PLAN FOR NEXT SESSION: f/u with HEP, glute and core strength, lumbar mobility, LE strength/functional strength; educate on proper exercises/gym routines/ walking program as indicated   2:33 PM, 07/01/22 Mearl Latin PT, DPT Physical Therapist at [facility]

## 2022-07-03 ENCOUNTER — Ambulatory Visit (HOSPITAL_COMMUNITY): Payer: Medicare HMO | Admitting: Physical Therapy

## 2022-07-03 DIAGNOSIS — R2689 Other abnormalities of gait and mobility: Secondary | ICD-10-CM | POA: Diagnosis not present

## 2022-07-03 DIAGNOSIS — M6281 Muscle weakness (generalized): Secondary | ICD-10-CM

## 2022-07-03 NOTE — Therapy (Signed)
OUTPATIENT PHYSICAL THERAPY THORACOLUMBAR treatment   Patient Name: Jason Johnson MRN: [id number] DOB:1940-09-07, 82 y.o., male Today's Date: 07/03/2022 END OF SESSION:   PT End of Session - 07/03/22 0951     Visit Number 11    Number of Visits 20    Date for PT Re-Evaluation 08/05/22    Authorization Type [employer] (no VL, no auth)    Progress Note Due on Visit 20    PT Start Time 0950    PT Stop Time 1030    PT Time Calculation (min) 40 min    Activity Tolerance Patient tolerated treatment well    Behavior During Therapy [facility] for tasks assessed/performed               Past Medical History:  Diagnosis Date   Carotid artery disease ([place])    Coronary artery disease    history of coronary artery bypass grafting in 1999   Gout    Hyperlipidemia    Hypertension    Myocardial infarction [facility]) 2009   Past Surgical History:  Procedure Laterality Date   CARDIAC CATHETERIZATION  2009   CATARACT EXTRACTION W/PHACO Right 09/09/2012   Procedure: CATARACT EXTRACTION PHACO AND INTRAOCULAR LENS PLACEMENT ([place]);  Surgeon: Tonny Branch, MD;  Location: [facility] ORS;  Service: Ophthalmology;  Laterality: Right;  CDE 14.48   CATARACT EXTRACTION W/PHACO Left 10/07/2012   Procedure: CATARACT EXTRACTION PHACO AND INTRAOCULAR LENS PLACEMENT ([facility]);  Surgeon: Tonny Branch, MD;  Location: [facility];  Service: Ophthalmology;  Laterality: Left;  CDE:12.24   COLONOSCOPY     COLONOSCOPY N/A 02/11/2017   Procedure: COLONOSCOPY;  Surgeon: Daneil Dolin, MD;  Location: [facility] END[facility] SUITE;  Service: Endoscopy;  Laterality: N/A;  7:30am   CORONARY ARTERY BYPASS GRAFT  2004   triple   DOPPLER ECHOCARDIOGRAPHY  2010   NM MYOVIEW LTD  2012   TONSILLECTOMY     as a child   Patient Active Problem List   Diagnosis Date Noted   Peripheral neuropathy 05/23/2022   Lumbar spondylosis 05/23/2022   Gait abnormality 02/10/2022   Paresthesia 02/10/2022   Weakness 02/10/2022   Irregular heartbeat 05/04/2019    Diverticula of colon 04/13/2017   Internal hemorrhoids 04/13/2017   Rectal bleeding 01/06/2017   History of arteriosclerotic cardiovascular disease 01/06/2017   Coronary artery disease 07/06/2013   Carotid artery disease ([place]) 07/06/2013   Essential hypertension 07/06/2013   Hyperlipidemia 07/06/2013    PCP: Redmond School MD  REFERRING PROVIDER: Marcial Pacas, MD  REFERRING DIAG: R26.9 (ICD-10-CM) - Gait abnormality G62.89 (ICD-10-CM) - Other polyneuropathy M47.816 (ICD-10-CM) - Lumbar spondylosis  Rationale for Evaluation and Treatment: Rehabilitation  THERAPY DIAG:  Other abnormalities of gait and mobility - Plan: PT plan of care cert/re-cert  Muscle weakness (generalized) - Plan: PT plan of care cert/re-cert  ONSET DATE: 1 year  SUBJECTIVE STATEMENT: PT states that he has not started walking yet. He did join the [facility] and has done 15 minutes on the nustep at a time rest and repeat.  His pain is down.  Pt states that he is still unable to walk up and down steps   PERTINENT HISTORY:  CAD, HTN, HLD, Hx MI  PAIN:  Are you having pain? No  PRECAUTIONS: None  WEIGHT BEARING RESTRICTIONS: No  FALLS:  Has patient fallen in last 6 months? No  OCCUPATION: Retired  PLOF: Independent  PATIENT GOALS: move better, walk, exercise  NEXT MD VISIT: none scheduled  OBJECTIVE:   DIAGNOSTIC FINDINGS:  MRI 04/10/22: IMPRESSION: This MRI of the lumbar spine without contrast shows the following: At L2-L3, there is severe loss of disc height associated with mild retrolisthesis.  There is moderately severe right lateral recess stenosis with impingement of the right L3 nerve root.  Compared to the 2010 MRI, this level has slightly progressed. At L3-L4, there are degenerative changes causing moderately severe  left lateral recess stenosis that could impinge upon the left L4 nerve root.  Degenerative changes at this level have mildly progressed compared to the 2010 MRI. At L4-L5, there is a right paramedian disc protrusion and other degenerative change causing mild spinal stenosis and moderately severe left lateral recess stenosis which could impinge upon the left L5 nerve root.  Degenerative changes have mildly progressed at this level At L5-S1, there is very minimal retrolisthesis and other degenerative change but no spinal stenosis or nerve root compression.  Degenerative changes at this level have improved compared to the 2010 MRI with resolution of the right S1 nerve root impingement  POSTURE: decreased lumbar lordosis  PALPATION: Minimal soreness in lumbar paraspinals, not tenderness in glutes bilaterally  LUMBAR ROM:   AROM eval 07/01/22  Flexion 50% limited 50% limited  Extension 50% limited 50% limited  Right lateral flexion 50% limited 25% limited  Left lateral flexion 50% limited 25% limited  Right rotation    Left rotation     (Blank rows = not tested)  LOWER EXTREMITY ROM:   Lacks TKE on L    LOWER EXTREMITY MMT:    MMT Right eval Left eval Right 07/01/22 Left 07/01/22  Hip flexion 4+ 4 5 5   Hip extension 4- 3+ 4 4-  Hip abduction 3+ 3+ 3+ 4  Hip adduction      Hip internal rotation      Hip external rotation      Knee flexion 5 4+ 5 5  Knee extension 5 4+ 5 5  Ankle dorsiflexion 5 4+ 5 5  Ankle plantarflexion      Ankle inversion      Ankle eversion       (Blank rows = not tested)   FUNCTIONAL TESTS:  5 times sit to stand: 16.73 seconds without UE support 2 minute walk test: 275  GAIT: Distance walked: 275 feet  Assistive device utilized: None Level of assistance: Complete Independence Comments: 2MWT, mild Trendelenburg on L, trunk slightly flexed, mild fatigue following   Reassessment 07/01/22 FUNCTIONAL TESTS:  5 times sit to stand: 11.18  seconds without  UE support 2 minute walk test: 410 feet TODAY'S TREATMENT:  DATE:  07/03/22: Heel raise x 10 Rockerboard x 2 minutes for both RT/LT and anterior/posterior Steps 4" x 2 RT Tandem stance both x 3 x each Squat x 10  Sit to stand x 10 Side stepping with green theraband x 2 rt   07/01/22 Reassessment  Discussed POC  06/27/22 Nustep seat 11 arms 8 level 4 x 5' dynamic warm up  Seated: Hamstring stretch 10" x 5 each  Standing: Heel/toe raises x 20 Slant board 5 x 20" Tandem stance on foam 2 x 30" each Hip vectors 2" x 5 each Rocker board F/B and S/S x 1' each; CGA for safety Bodycraft backwards walkouts 4 plates x 10  Sit to stand x 10 no UE assist   06/25/22 Nustep seat 11 arms 8 level 4 x 5' dynamic warm up  Standing: Heel/toe raises x 10 x 2 Gastrocnemius Slant board 5 x 30" Forward stepping over 4" hurdles, alt pattern x 5 rounds inside // bars, no UE assist Side stepping over 4" hurdles, alt pattern x 5 rounds inside // bars, no UE assist Sit to stand, standard seat height, no UE assist 2 x 5  Seated: Hamstring stretch 3 x 30" each  06/18/22  Nustep seat 11 arms 8 level 4 x 5' dynamic warm up  Standing: Heel/toe raises x 20 Slant board 5 x 20" Mini squats 2 x 10 Hip abduction GTB around mid shin x 10 each Hip extension GTB around mid shin x 10 each 6" step ups x 10 each no UE assist Bodycraft leg press 4 plates x 10  Sit to stand no UE assist 2 x 5  Seated: Hamstring stretch 5 x 20" each    06/16/22 Standing: Heel/toe raises 2 x 10 Slant board 5 x 20" Mini squat 2 x 10 Tandem stance 2 x 30" each 6" step ups x 10 each no UE assist Hip abduction GTB around mid shin x 10 each  Seated: Hamstring stretch with strap 3 x 20"  Nustep seat 11 arms 8 level 3 x 5'      06/12/2022 Nustep seat 11 arms 8 level 3 x 5' for dynamic warm  up Seated hamstring stretch x 30" x 3  Standing: Gastrocnemius slant board stretch x 30" x 3 Heel/toe raises x 20 Hip vectors, GTB x 10 each Mini squats x 3" x 10 x 2 6" step ups x 10 x 2, no UE assist  06/10/2022 Nustep seat 11 arms 8 level 2 x 5' for dynamic warm up  Standing: Heel/toe raises x 20 Hip vectors 3" hold x 5 each Squats to chair for target x 10 Tandem stance 2 x 30" each way 6" step ups x 8 each; right no UE assist; left 1 UE assist 4" lateral step ups x 8 each with 1 UE assist   06/04/2022 4: minute walk with 2 rest breaks Breast stroke against wall x 10: I Y to W Hip excursion x 3  Side step x 2 RT  Heel raise x 10  Mini squat x 10 Side-lying :  Clam x 10 both Hip abduction x 10  Supine: Bridge with hip adduction 5" hold  x 15    06/02/22 Treatment began with a 4:00 minute walk Hip excursion x 3 Seated piriformis stretch 1 rep x 10 seconds ( to difficult for patient at this time) Sitting tall x 5" x 3  Supine: Ab set x 10  Bridge x 5 Resisted clam with green theraband  x 10 Knee of chest 3 x 30" 05/28/22 Seated trunk flexion stretch 10 x 5 second holds Standing hip abduction 1 x 10 bilateral  Standing hip extension 1 x 10 bilateral    PATIENT EDUCATION: updated HEP  Education details: Patient educated on exam findings, POC, scope of PT, HEP, and general exercise. Person educated: Patient Education method: Explanation, Demonstration, and Handouts Education comprehension: verbalized understanding, returned demonstration, verbal cues required, and tactile cues required  HOME EXERCISE PROGRAM: Access Code: [email or web address] URL: [email or web address]   Date: 06/12/2022 - Seated Hamstring Stretch  - 2 x daily - 7 x weekly - 3 reps - 30 hold  06/10/22 tandem stance  Access Code:  URL: [email or web address] 06/[email or web address]/2022  Side Stepping with Counter Support  - 1 x daily - 7 x weekly - 2 sets - 10 reps - Heel Raises with Counter  Support  - 1 x daily - 7 x weekly - 2 sets - 10 reps - Mini Squat with Counter Support  - 1 x daily - 7 x weekly - 2 sets - 10 rep  Date: 06/02/2022 Prepared by: Rayetta Humphrey  Exercises - Supine Transversus Abdominis Bracing - Hands on Stomach  - 2 x daily - 7 x weekly - 1 sets - 10 reps - 5" hold - Beginner Bridge  - 2 x daily - 7 x weekly - 1 sets - 10 reps - 5" hold - Hooklying Clamshell with Resistance  - 2 x daily - 7 x weekly - 1 sets - 10 reps - 5" hold - Supine Single Knee to Chest Stretch  - 1 x daily - 7 x weekly - 1 sets - 3 reps - 30" hold - Seated Upright Posture Correction  - 1 x daily - 7 x weekly - 1 sets - 3 reps - 5" hold Date: 05/28/2022 - Seated Flexion Stretch  - 2-3 x daily - 7 x weekly - 10 reps - 5 second hold - Standing Hip Abduction with Counter Support  - 2-3 x daily - 7 x weekly - 2 sets - 10 reps - Standing Hip Extension with Counter Support  - 2-3 x daily - 7 x weekly - 3 sets - 10 reps  ASSESSMENT:  CLINICAL IMPRESSION: Pt needed multiple rest breaks throughout treatment.  Treatment focused on both balance and strengthening. Pt still has decreased strength and unable to ascend normal steps in a reciprocal manner.   Patient will continue to benefit from skilled physical therapy in order to improve function and reduce impairment.    OBJECTIVE IMPAIRMENTS: Abnormal gait, decreased activity tolerance, decreased balance, decreased endurance, decreased mobility, difficulty walking, decreased ROM, decreased strength, improper body mechanics, postural dysfunction, and pain.   ACTIVITY LIMITATIONS: carrying, lifting, bending, standing, squatting, stairs, transfers, locomotion level, and caring for others  PARTICIPATION LIMITATIONS: meal prep, cleaning, laundry, shopping, community activity, and yard work  PERSONAL FACTORS: Age, Time since onset of injury/illness/exacerbation, and 3+ comorbidities: CAD, HTN, HLD, Hx MI, lumbar spondylosis  are also affecting  patient's functional outcome.   REHAB POTENTIAL: Good  CLINICAL DECISION MAKING: Stable/uncomplicated  EVALUATION COMPLEXITY: Low   GOALS: Goals reviewed with patient? Yes  SHORT TERM GOALS: Target date: 06/18/2022    Patient will be independent with HEP in order to improve functional outcomes. Baseline:  Goal status: MET  2.  Patient will report at least 25% improvement in symptoms for improved quality of life. Baseline:  Goal status: MET   LONG TERM GOALS: Target Date: 07/09/2022  Patient will report at least 75% improvement in symptoms for improved quality of life. Baseline:  Goal status: IN PROGRESS  2.  Patient will be able to complete 5x STS in under 11.4 seconds in order to reduce the risk of falls. Baseline: 16.73 seconds without UE support 07/01/22 11.18  seconds without UE support Goal status: MET  3.  Patient will demonstrate at least 25% improvement in lumbar ROM in all restricted planes for improved ability to move trunk while completing chores. Baseline: see above Goal status: IN PROGRESS  4.  Patient will be able to ambulate at least 400 feet in 2MWT in order to demonstrate improved tolerance to activity. Baseline: 275 feet 07/01/22 410 feet Goal status: MET  5.  Patient will demonstrate grade of 5/5 MMT grade in all tested musculature as evidence of improved strength to assist with stair ambulation and gait.   Baseline: see above  Goal status: IN PROGRESS   PLAN:  PT FREQUENCY: 2x/week  PT DURATION: 6 weeks additional 4 weeks   PLANNED INTERVENTIONS: Therapeutic exercises, Therapeutic activity, Neuromuscular re-education, Balance training, Gait training, Patient/Family education, Joint manipulation, Joint mobilization, Stair training, Orthotic/Fit training, DME instructions, Aquatic Therapy, Dry Needling, Electrical stimulation, Spinal manipulation, Spinal mobilization, Cryotherapy, Moist heat, Compression bandaging, scar mobilization, Splintting,  Taping, Traction, Ultrasound, Ionotophoresis 4mg /ml Dexamethasone, and Manual therapy  PLAN FOR NEXT SESSION: Work on ability to ascend and descend steps in a reciprocal manner to allow pt to visit family.  Continue with LT LE strengthening

## 2022-07-09 ENCOUNTER — Ambulatory Visit (HOSPITAL_COMMUNITY): Payer: Medicare HMO | Attending: Neurology

## 2022-07-09 DIAGNOSIS — M6281 Muscle weakness (generalized): Secondary | ICD-10-CM | POA: Diagnosis present

## 2022-07-09 DIAGNOSIS — R2689 Other abnormalities of gait and mobility: Secondary | ICD-10-CM | POA: Diagnosis present

## 2022-07-09 NOTE — Therapy (Addendum)
OUTPATIENT PHYSICAL THERAPY THORACOLUMBAR treatment   Patient Name: Jason Johnson MRN: [id number] DOB:[birth year], 82 y.o., male Today's Date: 07/09/2022 END OF SESSION:   PT End of Session - 07/09/22 0818     Visit Number 12    Number of Visits 20    Date for PT Re-Evaluation 08/05/22    Authorization Type [employer] (no VL, no auth)    Progress Note Due on Visit 20    PT Start Time 0815    PT Stop Time [id number]    PT Time Calculation (min) 40 min    Activity Tolerance Patient tolerated treatment well    Behavior During Therapy [facility] for tasks assessed/performed               Past Medical History:  Diagnosis Date   Carotid artery disease ([place])    Coronary artery disease    history of coronary artery bypass grafting in 1999   Gout    Hyperlipidemia    Hypertension    Myocardial infarction [facility]) 2009   Past Surgical History:  Procedure Laterality Date   CARDIAC CATHETERIZATION  2009   CATARACT EXTRACTION W/PHACO Right 09/09/2012   Procedure: CATARACT EXTRACTION PHACO AND INTRAOCULAR LENS PLACEMENT ([place]);  Surgeon: Tonny Branch, MD;  Location: [facility] ORS;  Service: Ophthalmology;  Laterality: Right;  CDE 14.48   CATARACT EXTRACTION W/PHACO Left 10/07/2012   Procedure: CATARACT EXTRACTION PHACO AND INTRAOCULAR LENS PLACEMENT ([facility]);  Surgeon: Tonny Branch, MD;  Location: [facility] ORS;  Service: Ophthalmology;  Laterality: Left;  CDE:12.24   COLONOSCOPY     COLONOSCOPY N/A 02/11/2017   Procedure: COLONOSCOPY;  Surgeon: Daneil Dolin, MD;  Location: [facility] END[facility] SUITE;  Service: Endoscopy;  Laterality: N/A;  7:30am   CORONARY ARTERY BYPASS GRAFT  2004   triple   DOPPLER ECHOCARDIOGRAPHY  2010   NM MYOVIEW LTD  2012   TONSILLECTOMY     as a child   Patient Active Problem List   Diagnosis Date Noted   Peripheral neuropathy 05/23/2022   Lumbar spondylosis 05/23/2022   Gait abnormality 02/10/2022   Paresthesia 02/10/2022   Weakness 02/10/2022   Irregular heartbeat 05/04/2019    Diverticula of colon 04/13/2017   Internal hemorrhoids 04/13/2017   Rectal bleeding 01/06/2017   History of arteriosclerotic cardiovascular disease 01/06/2017   Coronary artery disease 07/06/2013   Carotid artery disease 07/06/2013   Essential hypertension 07/06/2013   Hyperlipidemia 07/06/2013    PCP: Redmond School MD  REFERRING PROVIDER: Marcial Pacas, MD  REFERRING DIAG: R26.9 (ICD-10-CM) - Gait abnormality G62.89 (ICD-10-CM) - Other polyneuropathy M47.816 (ICD-10-CM) - Lumbar spondylosis  Rationale for Evaluation and Treatment: Rehabilitation  THERAPY DIAG:  Other abnormalities of gait and mobility  Muscle weakness (generalized)  ONSET DATE: 1 year  SUBJECTIVE STATEMENT: Patient states that he's doing well today. Denies pain or recent falls. Patient states that he has been going to the [facility] and has been using the NuStep for 20 minutes at level 4. Claims that it's giving him a good work out. Patient states that he still has some difficulty with stairs.  PERTINENT HISTORY:  CAD, HTN, HLD, Hx MI  PAIN:  Are you having pain? No  PRECAUTIONS: None  WEIGHT BEARING RESTRICTIONS: No  FALLS:  Has patient fallen in last 6 months? No  OCCUPATION: Retired  PLOF: Independent  PATIENT GOALS: move better, walk, exercise  NEXT MD VISIT: none scheduled  OBJECTIVE:   DIAGNOSTIC FINDINGS:  MRI 04/10/22: IMPRESSION: This MRI of the lumbar spine without contrast shows the following: At L2-L3, there is severe loss of disc height associated with mild retrolisthesis.  There is moderately severe right lateral recess stenosis with impingement of the right L3 nerve root.  Compared to the 2010 MRI, this level has slightly progressed. At L3-L4, there are degenerative changes causing moderately severe left  lateral recess stenosis that could impinge upon the left L4 nerve root.  Degenerative changes at this level have mildly progressed compared to the 2010 MRI. At L4-L5, there is a right paramedian disc protrusion and other degenerative change causing mild spinal stenosis and moderately severe left lateral recess stenosis which could impinge upon the left L5 nerve root.  Degenerative changes have mildly progressed at this level At L5-S1, there is very minimal retrolisthesis and other degenerative change but no spinal stenosis or nerve root compression.  Degenerative changes at this level have improved compared to the 2010 MRI with resolution of the right S1 nerve root impingement  POSTURE: decreased lumbar lordosis  PALPATION: Minimal soreness in lumbar paraspinals, not tenderness in glutes bilaterally  LUMBAR ROM:   AROM eval 07/01/22  Flexion 50% limited 50% limited  Extension 50% limited 50% limited  Right lateral flexion 50% limited 25% limited  Left lateral flexion 50% limited 25% limited  Right rotation    Left rotation     (Blank rows = not tested)  LOWER EXTREMITY ROM:   Lacks TKE on L    LOWER EXTREMITY MMT:    MMT Right eval Left eval Right 07/01/22 Left 07/01/22  Hip flexion 4+ 4 5 5   Hip extension 4- 3+ 4 4-  Hip abduction 3+ 3+ 3+ 4  Hip adduction      Hip internal rotation      Hip external rotation      Knee flexion 5 4+ 5 5  Knee extension 5 4+ 5 5  Ankle dorsiflexion 5 4+ 5 5  Ankle plantarflexion      Ankle inversion      Ankle eversion       (Blank rows = not tested)   FUNCTIONAL TESTS:  5 times sit to stand: 16.73 seconds without UE support 2 minute walk test: 275  GAIT: Distance walked: 275 feet  Assistive device utilized: None Level of assistance: Complete Independence Comments: 2MWT, mild Trendelenburg on L, trunk slightly flexed, mild fatigue following   Reassessment 07/01/22 FUNCTIONAL TESTS:  5 times sit to stand: 11.18  seconds without UE  support 2 minute walk test: 410 feet TODAY'S TREATMENT:  DATE:  07/09/22 Nustep seat 11 arms 8 level 5 x 5' dynamic warm up Seated hamstring stretch x 30" x 3 Gastrocnemius slant board stretch x 30" x 2 Heel/toe raises x 20 Tandem stance on foam, eyes open x 30" x 3 on each Stair negotiation 4" steps, 7 steps x 4 rounds, CGA, no UE support Forward stepping over 4" hurdles, alt pattern x 5 rounds inside // bars, no UE assist Side stepping with GTB on ankles, 3 rounds inside // bars, no UE assist Forward and retro walks, 20 lbs (2 plates) x 4 rounds   07/03/22: Heel raise x 10 Rockerboard x 2 minutes for both RT/LT and anterior/posterior Steps 4" x 2 RT Tandem stance both x 3 x each Squat x 10  Sit to stand x 10 Side stepping with green theraband x 2 rt   07/01/22 Reassessment  Discussed POC  06/27/22 Nustep seat 11 arms 8 level 4 x 5' dynamic warm up  Seated: Hamstring stretch 10" x 5 each  Standing: Heel/toe raises x 20 Slant board 5 x 20" Tandem stance on foam 2 x 30" each Hip vectors 2" x 5 each Rocker board F/B and S/S x 1' each; CGA for safety Bodycraft backwards walkouts 4 plates x 10  Sit to stand x 10 no UE assist   06/25/22 Nustep seat 11 arms 8 level 4 x 5' dynamic warm up  Standing: Heel/toe raises x 10 x 2 Gastrocnemius Slant board 5 x 30" Forward stepping over 4" hurdles, alt pattern x 5 rounds inside // bars, no UE assist Side stepping over 4" hurdles, alt pattern x 5 rounds inside // bars, no UE assist Sit to stand, standard seat height, no UE assist 2 x 5  Seated: Hamstring stretch 3 x 30" each  06/18/22  Nustep seat 11 arms 8 level 4 x 5' dynamic warm up  Standing: Heel/toe raises x 20 Slant board 5 x 20" Mini squats 2 x 10 Hip abduction GTB around mid shin x 10 each Hip extension GTB around mid shin x 10 each 6" step  ups x 10 each no UE assist Bodycraft leg press 4 plates x 10  Sit to stand no UE assist 2 x 5  Seated: Hamstring stretch 5 x 20" each    06/16/22 Standing: Heel/toe raises 2 x 10 Slant board 5 x 20" Mini squat 2 x 10 Tandem stance 2 x 30" each 6" step ups x 10 each no UE assist Hip abduction GTB around mid shin x 10 each  Seated: Hamstring stretch with strap 3 x 20"  Nustep seat 11 arms 8 level 3 x 5'      06/12/2022 Nustep seat 11 arms 8 level 3 x 5' for dynamic warm up Seated hamstring stretch x 30" x 3  Standing: Gastrocnemius slant board stretch x 30" x 3 Heel/toe raises x 20 Hip vectors, GTB x 10 each Mini squats x 3" x 10 x 2 6" step ups x 10 x 2, no UE assist  06/10/2022 Nustep seat 11 arms 8 level 2 x 5' for dynamic warm up  Standing: Heel/toe raises x 20 Hip vectors 3" hold x 5 each Squats to chair for target x 10 Tandem stance 2 x 30" each way 6" step ups x 8 each; right no UE assist; left 1 UE assist 4" lateral step ups x 8 each with 1 UE assist   06/04/2022 4: minute walk with 2 rest breaks Breast stroke against wall x  10: I Y to W Hip excursion x 3  Side step x 2 RT  Heel raise x 10  Mini squat x 10 Side-lying :  Clam x 10 both Hip abduction x 10  Supine: Bridge with hip adduction 5" hold  x 15    06/02/22 Treatment began with a 4:00 minute walk Hip excursion x 3 Seated piriformis stretch 1 rep x 10 seconds ( to difficult for patient at this time) Sitting tall x 5" x 3  Supine: Ab set x 10  Bridge x 5 Resisted clam with green theraband  x 10 Knee of chest 3 x 30" 05/28/22 Seated trunk flexion stretch 10 x 5 second holds Standing hip abduction 1 x 10 bilateral  Standing hip extension 1 x 10 bilateral    PATIENT EDUCATION: updated HEP  Education details: Patient educated on exam findings, POC, scope of PT, HEP, and general exercise. Person educated: Patient Education method: Explanation, Demonstration, and Handouts Education  comprehension: verbalized understanding, returned demonstration, verbal cues required, and tactile cues required  HOME EXERCISE PROGRAM: Access Code: [email or web address] URL: [email or web address]   Date: 06/12/2022 - Seated Hamstring Stretch  - 2 x daily - 7 x weekly - 3 reps - 30 hold  06/10/22 tandem stance  Access Code:  URL: [email or web address] 06/[email or web address]/2022  Side Stepping with Counter Support  - 1 x daily - 7 x weekly - 2 sets - 10 reps - Heel Raises with Counter Support  - 1 x daily - 7 x weekly - 2 sets - 10 reps - Mini Squat with Counter Support  - 1 x daily - 7 x weekly - 2 sets - 10 rep  Date: 06/02/2022 Prepared by: Rayetta Humphrey  Exercises - Supine Transversus Abdominis Bracing - Hands on Stomach  - 2 x daily - 7 x weekly - 1 sets - 10 reps - 5" hold - Beginner Bridge  - 2 x daily - 7 x weekly - 1 sets - 10 reps - 5" hold - Hooklying Clamshell with Resistance  - 2 x daily - 7 x weekly - 1 sets - 10 reps - 5" hold - Supine Single Knee to Chest Stretch  - 1 x daily - 7 x weekly - 1 sets - 3 reps - 30" hold - Seated Upright Posture Correction  - 1 x daily - 7 x weekly - 1 sets - 3 reps - 5" hold Date: 05/28/2022 - Seated Flexion Stretch  - 2-3 x daily - 7 x weekly - 10 reps - 5 second hold - Standing Hip Abduction with Counter Support  - 2-3 x daily - 7 x weekly - 2 sets - 10 reps - Standing Hip Extension with Counter Support  - 2-3 x daily - 7 x weekly - 3 sets - 10 reps  ASSESSMENT:  CLINICAL IMPRESSION: Interventions today were geared towards LE strengthening and flexibility. Patient was able to demonstrate alternating pattern on ascending/descending stairs but with slight to mild unsteadiness particularly in descending due to impaired proprioception. Demonstrated mild levels of fatigue. Rest periods provided. Provided mild amount of cueing to ensure correct execution of activity with good carry-over. To date, skilled PT is required to address the  impairments and improve function.   OBJECTIVE IMPAIRMENTS: Abnormal gait, decreased activity tolerance, decreased balance, decreased endurance, decreased mobility, difficulty walking, decreased ROM, decreased strength, improper body mechanics, postural dysfunction, and pain.   ACTIVITY LIMITATIONS: carrying, lifting, bending, standing, squatting, stairs, transfers, locomotion level, and caring for others  PARTICIPATION LIMITATIONS:  meal prep, cleaning, laundry, shopping, community activity, and yard work  PERSONAL FACTORS: Age, Time since onset of injury/illness/exacerbation, and 3+ comorbidities: CAD, HTN, HLD, Hx MI, lumbar spondylosis  are also affecting patient's functional outcome.   REHAB POTENTIAL: Good  CLINICAL DECISION MAKING: Stable/uncomplicated  EVALUATION COMPLEXITY: Low   GOALS: Goals reviewed with patient? Yes  SHORT TERM GOALS: Target date: 06/18/2022    Patient will be independent with HEP in order to improve functional outcomes. Baseline:  Goal status: MET  2.  Patient will report at least 25% improvement in symptoms for improved quality of life. Baseline:  Goal status: MET   LONG TERM GOALS: Target Date: 07/09/2022    Patient will report at least 75% improvement in symptoms for improved quality of life. Baseline:  Goal status: IN PROGRESS  2.  Patient will be able to complete 5x STS in under 11.4 seconds in order to reduce the risk of falls. Baseline: 16.73 seconds without UE support 07/01/22 11.18  seconds without UE support Goal status: MET  3.  Patient will demonstrate at least 25% improvement in lumbar ROM in all restricted planes for improved ability to move trunk while completing chores. Baseline: see above Goal status: IN PROGRESS  4.  Patient will be able to ambulate at least 400 feet in 2MWT in order to demonstrate improved tolerance to activity. Baseline: 275 feet 07/01/22 410 feet Goal status: MET  5.  Patient will demonstrate grade of 5/5  MMT grade in all tested musculature as evidence of improved strength to assist with stair ambulation and gait.   Baseline: see above  Goal status: IN PROGRESS   PLAN:  PT FREQUENCY: 2x/week  PT DURATION: 6 weeks additional 4 weeks   PLANNED INTERVENTIONS: Therapeutic exercises, Therapeutic activity, Neuromuscular re-education, Balance training, Gait training, Patient/Family education, Joint manipulation, Joint mobilization, Stair training, Orthotic/Fit training, DME instructions, Aquatic Therapy, Dry Needling, Electrical stimulation, Spinal manipulation, Spinal mobilization, Cryotherapy, Moist heat, Compression bandaging, scar mobilization, Splintting, Taping, Traction, Ultrasound, Ionotophoresis 4mg /ml Dexamethasone, and Manual therapy  PLAN FOR NEXT SESSION: Work on ability to ascend and descend steps in a reciprocal manner to allow pt to visit family.  Continue with LT LE strengthening   Chrissie Noa L. Cleotilde Spadaccini, PT, DPT, OCS Board-Certified Clinical Specialist in [place] # ([place]): [id number] T 9:01 am

## 2022-07-15 ENCOUNTER — Ambulatory Visit (HOSPITAL_COMMUNITY): Payer: Medicare HMO

## 2022-07-15 DIAGNOSIS — R2689 Other abnormalities of gait and mobility: Secondary | ICD-10-CM | POA: Diagnosis not present

## 2022-07-15 NOTE — Therapy (Signed)
OUTPATIENT PHYSICAL THERAPY THORACOLUMBAR treatment   Patient Name: Jason BatemanJohn F Johnson MRN: [id number] DOB:1940-04-29, 82 y.o., male Today's Date: 07/15/2022 END OF SESSION:   PT End of Session - 07/15/22 1002     Visit Number 13    Number of Visits 20    Date for PT Re-Evaluation 08/05/22    Authorization Type [employer] (no VL, no auth)    Progress Note Due on Visit 20    PT Start Time 0945    PT Stop Time 1025    PT Time Calculation (min) 40 min    Activity Tolerance Patient tolerated treatment well    Behavior During Therapy WFL for tasks assessed/performed                Past Medical History:  Diagnosis Date   Carotid artery disease (HCC)    Coronary artery disease    history of coronary artery bypass grafting in 1999   Gout    Hyperlipidemia    Hypertension    Myocardial infarction [facility](HCC) 2009   Past Surgical History:  Procedure Laterality Date   CARDIAC CATHETERIZATION  2009   CATARACT EXTRACTION W/PHACO Right 09/09/2012   Procedure: CATARACT EXTRACTION PHACO AND INTRAOCULAR LENS PLACEMENT (IOC);  Surgeon: Gemma PayorKerry Hunt, MD;  Location: [facility];  Service: Ophthalmology;  Laterality: Right;  CDE 14.48   CATARACT EXTRACTION W/PHACO Left 10/07/2012   Procedure: CATARACT EXTRACTION PHACO AND INTRAOCULAR LENS PLACEMENT (IOC);  Surgeon: Gemma PayorKerry Hunt, MD;  Location: [facility];  Service: Ophthalmology;  Laterality: Left;  CDE:12.24   COLONOSCOPY     COLONOSCOPY N/A 02/11/2017   Procedure: COLONOSCOPY;  Surgeon: Corbin Adeourk, Robert M, MD;  Location: [facility];  Service: Endoscopy;  Laterality: N/A;  7:30am   CORONARY ARTERY BYPASS GRAFT  2004   triple   DOPPLER ECHOCARDIOGRAPHY  2010   NM MYOVIEW LTD  2012   TONSILLECTOMY     as a child   Patient Active Problem List   Diagnosis Date Noted   Peripheral neuropathy 05/23/2022   Lumbar spondylosis 05/23/2022   Gait abnormality 02/10/2022   Paresthesia 02/10/2022   Weakness 02/10/2022   Irregular heartbeat 05/04/2019    Diverticula of colon 04/13/2017   Internal hemorrhoids 04/13/2017   Rectal bleeding 01/06/2017   History of arteriosclerotic cardiovascular disease 01/06/2017   Coronary artery disease 07/06/2013   Carotid artery disease 07/06/2013   Essential hypertension 07/06/2013   Hyperlipidemia 07/06/2013    PCP: Elfredia NevinsLawrence Fusco MD  REFERRING PROVIDER: Levert FeinsteinYan, Yijun, MD  REFERRING DIAG: R26.9 (ICD-10-CM) - Gait abnormality G62.89 (ICD-10-CM) - Other polyneuropathy M47.816 (ICD-10-CM) - Lumbar spondylosis  Rationale for Evaluation and Treatment: Rehabilitation  THERAPY DIAG:  Other abnormalities of gait and mobility  ONSET DATE: 1 year  SUBJECTIVE STATEMENT: Doing well today. Denies any pain.  PERTINENT HISTORY:  CAD, HTN, HLD, Hx MI  PAIN:  Are you having pain? No  PRECAUTIONS: None  WEIGHT BEARING RESTRICTIONS: No  FALLS:  Has patient fallen in last 6 months? No  OCCUPATION: Retired  PLOF: Independent  PATIENT GOALS: move better, walk, exercise  NEXT MD VISIT: none scheduled  OBJECTIVE:   DIAGNOSTIC FINDINGS:  MRI 04/10/22: IMPRESSION: This MRI of the lumbar spine without contrast shows the following: At L2-L3, there is severe loss of disc height associated with mild retrolisthesis.  There is moderately severe right lateral recess stenosis with impingement of the right L3 nerve root.  Compared to the 2010 MRI, this level has slightly progressed. At L3-L4, there are degenerative changes causing moderately severe left lateral recess stenosis that could impinge upon the left L4 nerve root.  Degenerative changes at this level have mildly progressed compared to the 2010 MRI. At L4-L5, there is a right paramedian disc protrusion and other degenerative change causing mild spinal stenosis and moderately  severe left lateral recess stenosis which could impinge upon the left L5 nerve root.  Degenerative changes have mildly progressed at this level At L5-S1, there is very minimal retrolisthesis and other degenerative change but no spinal stenosis or nerve root compression.  Degenerative changes at this level have improved compared to the 2010 MRI with resolution of the right S1 nerve root impingement  POSTURE: decreased lumbar lordosis  PALPATION: Minimal soreness in lumbar paraspinals, not tenderness in glutes bilaterally  LUMBAR ROM:   AROM eval 07/01/22  Flexion 50% limited 50% limited  Extension 50% limited 50% limited  Right lateral flexion 50% limited 25% limited  Left lateral flexion 50% limited 25% limited  Right rotation    Left rotation     (Blank rows = not tested)  LOWER EXTREMITY ROM:   Lacks TKE on L    LOWER EXTREMITY MMT:    MMT Right eval Left eval Right 07/01/22 Left 07/01/22  Hip flexion 4+ 4 5 5   Hip extension 4- 3+ 4 4-  Hip abduction 3+ 3+ 3+ 4  Hip adduction      Hip internal rotation      Hip external rotation      Knee flexion 5 4+ 5 5  Knee extension 5 4+ 5 5  Ankle dorsiflexion 5 4+ 5 5  Ankle plantarflexion      Ankle inversion      Ankle eversion       (Blank rows = not tested)   FUNCTIONAL TESTS:  5 times sit to stand: 16.73 seconds without UE support 2 minute walk test: 275  GAIT: Distance walked: 275 feet  Assistive device utilized: None Level of assistance: Complete Independence Comments: , mild Trendelenburg on L, trunk slightly flexed, mild fatigue following   Reassessment 07/01/22 FUNCTIONAL TESTS:  5 times sit to stand: 11.18  seconds without UE support 2 minute walk test: 410 feet TODAY'S TREATMENT:  DATE:  07/15/22 Nustep seat 11 arms 8 level 5 x 5' dynamic warm up Seated hamstring stretch x 30" x  3 Gastrocnemius slant board stretch x 30" x 3 Heel/toe raises x 20 Tandem stance on foam, eyes open x 30" x 3 on each Stair negotiation 4" steps, 7 steps x 4 rounds, CGA, no UE support Forward stepping over 4" hurdles, alt pattern x 5 rounds inside // bars, no UE assist Side stepping with GTB on ankles, 3 rounds inside // bars, no UE assist Forward and retro walks, 20 lbs (2 plates) x 4 rounds  07/09/22 Nustep seat 11 arms 8 level 5 x 5' dynamic warm up Seated hamstring stretch x 30" x 3 Gastrocnemius slant board stretch x 30" x 3 Heel/toe raises x 20 Tandem stance on foam, eyes open x 30" x 3 on each Stair negotiation 4" steps, 7 steps x 4 rounds, CGA, no UE support Forward stepping over 4" hurdles, alt pattern x 5 rounds inside // bars, no UE assist Side stepping with GTB on ankles, 3 rounds inside // bars, no UE assist Forward and retro walks, 20 lbs (2 plates) x 4 rounds   07/03/22: Heel raise x 10 Rockerboard x 2 minutes for both RT/LT and anterior/posterior Steps 4" x 2 RT Tandem stance both x 3 x each Squat x 10  Sit to stand x 10 Side stepping with green theraband x 2 rt   07/01/22 Reassessment  Discussed POC  06/27/22 Nustep seat 11 arms 8 level 4 x 5' dynamic warm up  Seated: Hamstring stretch 10" x 5 each  Standing: Heel/toe raises x 20 Slant board 5 x 20" Tandem stance on foam 2 x 30" each Hip vectors 2" x 5 each Rocker board F/B and S/S x 1' each; CGA for safety [employer] backwards walkouts 4 plates x 10  Sit to stand x 10 no UE assist   06/25/22 Nustep seat 11 arms 8 level 4 x 5' dynamic warm up  Standing: Heel/toe raises x 10 x 2 Gastrocnemius Slant board 5 x 30" Forward stepping over 4" hurdles, alt pattern x 5 rounds inside // bars, no UE assist Side stepping over 4" hurdles, alt pattern x 5 rounds inside // bars, no UE assist Sit to stand, standard seat height, no UE assist 2 x 5  Seated: Hamstring stretch 3 x 30" each  06/18/22  Nustep  seat 11 arms 8 level 4 x 5' dynamic warm up  Standing: Heel/toe raises x 20 Slant board 5 x 20" Mini squats 2 x 10 Hip abduction GTB around mid shin x 10 each Hip extension GTB around mid shin x 10 each 6" step ups x 10 each no UE assist [employer] leg press 4 plates x 10  Sit to stand no UE assist 2 x 5  Seated: Hamstring stretch 5 x 20" each    06/16/22 Standing: Heel/toe raises 2 x 10 Slant board 5 x 20" Mini squat 2 x 10 Tandem stance 2 x 30" each 6" step ups x 10 each no UE assist Hip abduction GTB around mid shin x 10 each  Seated: Hamstring stretch with strap 3 x 20"  Nustep seat 11 arms 8 level 3 x 5'      06/12/2022 Nustep seat 11 arms 8 level 3 x 5' for dynamic warm up Seated hamstring stretch x 30" x 3  Standing: Gastrocnemius slant board stretch x 30" x 3 Heel/toe raises x 20 Hip vectors, GTB x 10 each  Mini squats x 3" x 10 x 2 6" step ups x 10 x 2, no UE assist  06/10/2022 Nustep seat 11 arms 8 level 2 x 5' for dynamic warm up  Standing: Heel/toe raises x 20 Hip vectors 3" hold x 5 each Squats to chair for target x 10 Tandem stance 2 x 30" each way 6" step ups x 8 each; right no UE assist; left 1 UE assist 4" lateral step ups x 8 each with 1 UE assist   06/04/2022 4: minute walk with 2 rest breaks Breast stroke against wall x 10: I Y to W Hip excursion x 3  Side step x 2 RT  Heel raise x 10  Mini squat x 10 Side-lying :  Clam x 10 both Hip abduction x 10  Supine: Bridge with hip adduction 5" hold  x 15    06/02/22 Treatment began with a 4:00 minute walk Hip excursion x 3 Seated piriformis stretch 1 rep x 10 seconds ( to difficult for patient at this time) Sitting tall x 5" x 3  Supine: Ab set x 10  Bridge x 5 Resisted clam with green theraband  x 10 Knee of chest 3 x 30" 05/28/22 Seated trunk flexion stretch 10 x 5 second holds Standing hip abduction 1 x 10 bilateral  Standing hip extension 1 x 10 bilateral    PATIENT  EDUCATION: updated HEP  Education details: Patient educated on exam findings, POC, scope of PT, HEP, and general exercise. Person educated: Patient Education method: Explanation, Demonstration, and Handouts Education comprehension: verbalized understanding, returned demonstration, verbal cues required, and tactile cues required  HOME EXERCISE PROGRAM: Access Code: [email or web address] URL: [email or web address]   Date: 06/12/2022 - Seated Hamstring Stretch  - 2 x daily - 7 x weekly - 3 reps - 30 hold  06/10/22 tandem stance  Access Code:  URL: [email or web address] 06/[email or web address]/2022  Side Stepping with Counter Support  - 1 x daily - 7 x weekly - 2 sets - 10 reps - Heel Raises with Counter Support  - 1 x daily - 7 x weekly - 2 sets - 10 reps - Mini Squat with Counter Support  - 1 x daily - 7 x weekly - 2 sets - 10 rep  Date: 06/02/2022 Prepared by: Virgina Organ  Exercises - Supine Transversus Abdominis Bracing - Hands on Stomach  - 2 x daily - 7 x weekly - 1 sets - 10 reps - 5" hold - Beginner Bridge  - 2 x daily - 7 x weekly - 1 sets - 10 reps - 5" hold - Hooklying Clamshell with Resistance  - 2 x daily - 7 x weekly - 1 sets - 10 reps - 5" hold - Supine Single Knee to Chest Stretch  - 1 x daily - 7 x weekly - 1 sets - 3 reps - 30" hold - Seated Upright Posture Correction  - 1 x daily - 7 x weekly - 1 sets - 3 reps - 5" hold Date: 05/28/2022 - Seated Flexion Stretch  - 2-3 x daily - 7 x weekly - 10 reps - 5 second hold - Standing Hip Abduction with Counter Support  - 2-3 x daily - 7 x weekly - 2 sets - 10 reps - Standing Hip Extension with Counter Support  - 2-3 x daily - 7 x weekly - 3 sets - 10 reps  ASSESSMENT:  CLINICAL IMPRESSION: Interventions today were geared towards LE strengthening and flexibility. Patient was able to demonstrate alternating pattern  on ascending/descending stairs with only slight unsteadiness particularly in descending due to impaired  proprioception. Attempted to do stair negotiation on a 7" step stairs but patient had moderate difficulty. Demonstrated mild levels of fatigue. Rest periods provided. Provided mild amount of cueing to ensure correct execution of activity with good carry-over. To date, skilled PT is required to address the impairments and improve function.   OBJECTIVE IMPAIRMENTS: Abnormal gait, decreased activity tolerance, decreased balance, decreased endurance, decreased mobility, difficulty walking, decreased ROM, decreased strength, improper body mechanics, postural dysfunction, and pain.   ACTIVITY LIMITATIONS: carrying, lifting, bending, standing, squatting, stairs, transfers, locomotion level, and caring for others  PARTICIPATION LIMITATIONS: meal prep, cleaning, laundry, shopping, community activity, and yard work  PERSONAL FACTORS: Age, Time since onset of injury/illness/exacerbation, and 3+ comorbidities: CAD, HTN, HLD, Hx MI, lumbar spondylosis  are also affecting patient's functional outcome.   REHAB POTENTIAL: Good  CLINICAL DECISION MAKING: Stable/uncomplicated  EVALUATION COMPLEXITY: Low   GOALS: Goals reviewed with patient? Yes  SHORT TERM GOALS: Target date: 06/18/2022    Patient will be independent with HEP in order to improve functional outcomes. Baseline:  Goal status: MET  2.  Patient will report at least 25% improvement in symptoms for improved quality of life. Baseline:  Goal status: MET   LONG TERM GOALS: Target Date: 07/09/2022    Patient will report at least 75% improvement in symptoms for improved quality of life. Baseline:  Goal status: IN PROGRESS  2.  Patient will be able to complete 5x STS in under 11.4 seconds in order to reduce the risk of falls. Baseline: 16.73 seconds without UE support 07/01/22 11.18  seconds without UE support Goal status: MET  3.  Patient will demonstrate at least 25% improvement in lumbar ROM in all restricted planes for improved ability  to move trunk while completing chores. Baseline: see above Goal status: IN PROGRESS  4.  Patient will be able to ambulate at least 400 feet in in order to demonstrate improved tolerance to activity. Baseline: 275 feet 07/01/22 410 feet Goal status: MET  5.  Patient will demonstrate grade of 5/5 MMT grade in all tested musculature as evidence of improved strength to assist with stair ambulation and gait.   Baseline: see above  Goal status: IN PROGRESS   PLAN:  PT FREQUENCY: 2x/week  PT DURATION: 6 weeks additional 4 weeks   PLANNED INTERVENTIONS: Therapeutic exercises, Therapeutic activity, Neuromuscular re-education, Balance training, Gait training, Patient/Family education, Joint manipulation, Joint mobilization, Stair training, Orthotic/Fit training, DME instructions, Aquatic Therapy, Dry Needling, Electrical stimulation, Spinal manipulation, Spinal mobilization, Cryotherapy, Moist heat, Compression bandaging, scar mobilization, Splintting, Taping, Traction, Ultrasound, Ionotophoresis 4mg /ml Dexamethasone, and Manual therapy  PLAN FOR NEXT SESSION: Work on ability to ascend and descend steps in a reciprocal manner to allow pt to visit family.  Continue with LT LE strengthening   Iantha Fallen L. Dorsie Sethi, PT, DPT, OCS Board-Certified Clinical Specialist in Orthopedic PT PT Compact Privilege # ([place]): [phone] T 9:01 am

## 2022-07-16 ENCOUNTER — Other Ambulatory Visit: Payer: Self-pay | Admitting: Cardiovascular Disease

## 2022-07-17 ENCOUNTER — Ambulatory Visit (HOSPITAL_COMMUNITY): Payer: Medicare HMO

## 2022-07-17 DIAGNOSIS — R2689 Other abnormalities of gait and mobility: Secondary | ICD-10-CM | POA: Diagnosis not present

## 2022-07-17 DIAGNOSIS — M6281 Muscle weakness (generalized): Secondary | ICD-10-CM

## 2022-07-17 NOTE — Therapy (Addendum)
OUTPATIENT PHYSICAL THERAPY THORACOLUMBAR treatment   Patient Name: Jason Johnson MRN: [id number] DOB:1941-02-06, 82 y.o., male Today's Date: 07/17/2022  END OF SESSION:   PT End of Session - 07/17/22 0736     Visit Number 14    Number of Visits 20    Date for PT Re-Evaluation 08/05/22    Authorization Type [employer] (no VL, no auth)    Progress Note Due on Visit 20    PT Start Time 0731    PT Stop Time 0810    PT Time Calculation (min) 39 min    Equipment Utilized During Treatment Gait belt    Activity Tolerance Patient tolerated treatment well    Behavior During Therapy WFL for tasks assessed/performed               Past Medical History:  Diagnosis Date   Carotid artery disease (HCC)    Coronary artery disease    history of coronary artery bypass grafting in 1999   Gout    Hyperlipidemia    Hypertension    Myocardial infarction [facility]) 2009   Past Surgical History:  Procedure Laterality Date   CARDIAC CATHETERIZATION  2009   CATARACT EXTRACTION W/PHACO Right 09/09/2012   Procedure: CATARACT EXTRACTION PHACO AND INTRAOCULAR LENS PLACEMENT (IOC);  Surgeon: Gemma Payor, MD;  Location: [facility];  Service: Ophthalmology;  Laterality: Right;  CDE 14.48   CATARACT EXTRACTION W/PHACO Left 10/07/2012   Procedure: CATARACT EXTRACTION PHACO AND INTRAOCULAR LENS PLACEMENT (IOC);  Surgeon: Gemma Payor, MD;  Location: [facility];  Service: Ophthalmology;  Laterality: Left;  CDE:12.24   COLONOSCOPY     COLONOSCOPY N/A 02/11/2017   Procedure: COLONOSCOPY;  Surgeon: Corbin Ade, MD;  Location: [facility];  Service: Endoscopy;  Laterality: N/A;  7:30am   CORONARY ARTERY BYPASS GRAFT  2004   triple   DOPPLER ECHOCARDIOGRAPHY  2010   NM MYOVIEW LTD  2012   TONSILLECTOMY     as a child   Patient Active Problem List   Diagnosis Date Noted   Peripheral neuropathy 05/23/2022   Lumbar spondylosis 05/23/2022   Gait abnormality 02/10/2022   Paresthesia 02/10/2022   Weakness  02/10/2022   Irregular heartbeat 05/04/2019   Diverticula of colon 04/13/2017   Internal hemorrhoids 04/13/2017   Rectal bleeding 01/06/2017   History of arteriosclerotic cardiovascular disease 01/06/2017   Coronary artery disease 07/06/2013   Carotid artery disease 07/06/2013   Essential hypertension 07/06/2013   Hyperlipidemia 07/06/2013    PCP: Elfredia Nevins MD  REFERRING PROVIDER: Levert Feinstein, MD  REFERRING DIAG: R26.9 (ICD-10-CM) - Gait abnormality G62.89 (ICD-10-CM) - Other polyneuropathy M47.816 (ICD-10-CM) - Lumbar spondylosis  Rationale for Evaluation and Treatment: Rehabilitation  THERAPY DIAG:  Other abnormalities of gait and mobility  Muscle weakness (generalized)  ONSET DATE: 1 year  SUBJECTIVE STATEMENT: Patient states that he's doing well today and denies pain.  PERTINENT HISTORY:  CAD, HTN, HLD, Hx MI  PAIN:  Are you having pain? No  PRECAUTIONS: None  WEIGHT BEARING RESTRICTIONS: No  FALLS:  Has patient fallen in last 6 months? No  OCCUPATION: Retired  PLOF: Independent  PATIENT GOALS: move better, walk, exercise  NEXT MD VISIT: none scheduled  OBJECTIVE:   DIAGNOSTIC FINDINGS:  MRI 04/10/22: IMPRESSION: This MRI of the lumbar spine without contrast shows the following: At L2-L3, there is severe loss of disc height associated with mild retrolisthesis.  There is moderately severe right lateral recess stenosis with impingement of the right L3 nerve root.  Compared to the 2010 MRI, this level has slightly progressed. At L3-L4, there are degenerative changes causing moderately severe left lateral recess stenosis that could impinge upon the left L4 nerve root.  Degenerative changes at this level have mildly progressed compared to the 2010 MRI. At L4-L5, there is a  right paramedian disc protrusion and other degenerative change causing mild spinal stenosis and moderately severe left lateral recess stenosis which could impinge upon the left L5 nerve root.  Degenerative changes have mildly progressed at this level At L5-S1, there is very minimal retrolisthesis and other degenerative change but no spinal stenosis or nerve root compression.  Degenerative changes at this level have improved compared to the 2010 MRI with resolution of the right S1 nerve root impingement  POSTURE: decreased lumbar lordosis  PALPATION: Minimal soreness in lumbar paraspinals, not tenderness in glutes bilaterally  LUMBAR ROM:   AROM eval 07/01/22  Flexion 50% limited 50% limited  Extension 50% limited 50% limited  Right lateral flexion 50% limited 25% limited  Left lateral flexion 50% limited 25% limited  Right rotation    Left rotation     (Blank rows = not tested)  LOWER EXTREMITY ROM:   Lacks TKE on L    LOWER EXTREMITY MMT:    MMT Right eval Left eval Right 07/01/22 Left 07/01/22  Hip flexion 4+ 4 5 5   Hip extension 4- 3+ 4 4-  Hip abduction 3+ 3+ 3+ 4  Hip adduction      Hip internal rotation      Hip external rotation      Knee flexion 5 4+ 5 5  Knee extension 5 4+ 5 5  Ankle dorsiflexion 5 4+ 5 5  Ankle plantarflexion      Ankle inversion      Ankle eversion       (Blank rows = not tested)   FUNCTIONAL TESTS:  5 times sit to stand: 16.73 seconds without UE support 2 minute walk test: 275  GAIT: Distance walked: 275 feet  Assistive device utilized: None Level of assistance: Complete Independence Comments: , mild Trendelenburg on L, trunk slightly flexed, mild fatigue following   Reassessment 07/01/22 FUNCTIONAL TESTS:  5 times sit to stand: 11.18  seconds without UE support 2 minute walk test: 410 feet TODAY'S TREATMENT:  DATE:  07/17/22 Nustep seat 11 arms 8 level 5 x 5' dynamic warm up Seated hamstring stretch x 30" x 3 Gastrocnemius slant board stretch x 30" x 3 Heel/toe raises x 20 x 2 lbs Tandem stance on foam, eyes open x 30" x 3 on each Stair negotiation 7" steps, 7 steps x 4 rounds, CGA-SBA, 1 UE support Forward stepping over 4" hurdles, alt pattern x 5 rounds inside // bars, no UE assist Side stepping with  blue TB on ankles, 3 rounds inside // bars, no UE assist Forward and retro walks, 20 lbs (2 plates) x 4 rounds  07/15/22 Nustep seat 11 arms 8 level 5 x 5' dynamic warm up Seated hamstring stretch x 30" x 3 Gastrocnemius slant board stretch x 30" x 3 Heel/toe raises x 20 Tandem stance on foam, eyes open x 30" x 3 on each Stair negotiation 4" steps, 7 steps x 4 rounds, CGA, no UE support Forward stepping over 4" hurdles, alt pattern x 5 rounds inside // bars, no UE assist Side stepping with GTB on ankles, 3 rounds inside // bars, no UE assist Forward and retro walks, 20 lbs (2 plates) x 4 rounds  07/09/22 Nustep seat 11 arms 8 level 5 x 5' dynamic warm up Seated hamstring stretch x 30" x 3 Gastrocnemius slant board stretch x 30" x 3 Heel/toe raises x 20 Tandem stance on foam, eyes open x 30" x 3 on each Stair negotiation 4" steps, 7 steps x 4 rounds, CGA, no UE support Forward stepping over 4" hurdles, alt pattern x 5 rounds inside // bars, no UE assist Side stepping with GTB on ankles, 3 rounds inside // bars, no UE assist Forward and retro walks, 20 lbs (2 plates) x 4 rounds   07/03/22: Heel raise x 10 Rockerboard x 2 minutes for both RT/LT and anterior/posterior Steps 4" x 2 RT Tandem stance both x 3 x each Squat x 10  Sit to stand x 10 Side stepping with green theraband x 2 rt   07/01/22 Reassessment  Discussed POC  06/27/22 Nustep seat 11 arms 8 level 4 x 5' dynamic warm up  Seated: Hamstring stretch 10" x 5 each  Standing: Heel/toe raises x 20 Slant board 5 x  20" Tandem stance on foam 2 x 30" each Hip vectors 2" x 5 each Rocker board F/B and S/S x 1' each; CGA for safety [employer] backwards walkouts 4 plates x 10  Sit to stand x 10 no UE assist   06/25/22 Nustep seat 11 arms 8 level 4 x 5' dynamic warm up  Standing: Heel/toe raises x 10 x 2 Gastrocnemius Slant board 5 x 30" Forward stepping over 4" hurdles, alt pattern x 5 rounds inside // bars, no UE assist Side stepping over 4" hurdles, alt pattern x 5 rounds inside // bars, no UE assist Sit to stand, standard seat height, no UE assist 2 x 5  Seated: Hamstring stretch 3 x 30" each  06/18/22  Nustep seat 11 arms 8 level 4 x 5' dynamic warm up  Standing: Heel/toe raises x 20 Slant board 5 x 20" Mini squats 2 x 10 Hip abduction GTB around mid shin x 10 each Hip extension GTB around mid shin x 10 each 6" step ups x 10 each no UE assist [employer] leg press 4 plates x 10  Sit to stand no UE assist 2 x 5  Seated: Hamstring stretch 5 x 20" each    06/16/22 Standing: Heel/toe  raises 2 x 10 Slant board 5 x 20" Mini squat 2 x 10 Tandem stance 2 x 30" each 6" step ups x 10 each no UE assist Hip abduction GTB around mid shin x 10 each  Seated: Hamstring stretch with strap 3 x 20"  Nustep seat 11 arms 8 level 3 x 5'      06/12/2022 Nustep seat 11 arms 8 level 3 x 5' for dynamic warm up Seated hamstring stretch x 30" x 3  Standing: Gastrocnemius slant board stretch x 30" x 3 Heel/toe raises x 20 Hip vectors, GTB x 10 each Mini squats x 3" x 10 x 2 6" step ups x 10 x 2, no UE assist  06/10/2022 Nustep seat 11 arms 8 level 2 x 5' for dynamic warm up  Standing: Heel/toe raises x 20 Hip vectors 3" hold x 5 each Squats to chair for target x 10 Tandem stance 2 x 30" each way 6" step ups x 8 each; right no UE assist; left 1 UE assist 4" lateral step ups x 8 each with 1 UE assist   06/04/2022 4: minute walk with 2 rest breaks Breast stroke against wall x 10: I Y to  W Hip excursion x 3  Side step x 2 RT  Heel raise x 10  Mini squat x 10 Side-lying :  Clam x 10 both Hip abduction x 10  Supine: Bridge with hip adduction 5" hold  x 15    06/02/22 Treatment began with a 4:00 minute walk Hip excursion x 3 Seated piriformis stretch 1 rep x 10 seconds ( to difficult for patient at this time) Sitting tall x 5" x 3  Supine: Ab set x 10  Bridge x 5 Resisted clam with green theraband  x 10 Knee of chest 3 x 30" 05/28/22 Seated trunk flexion stretch 10 x 5 second holds Standing hip abduction 1 x 10 bilateral  Standing hip extension 1 x 10 bilateral    PATIENT EDUCATION: updated HEP  Education details: Patient educated on exam findings, POC, scope of PT, HEP, and general exercise. Person educated: Patient Education method: Explanation, Demonstration, and Handouts Education comprehension: verbalized understanding, returned demonstration, verbal cues required, and tactile cues required  HOME EXERCISE PROGRAM: Access Code: [email or web address] URL: [email or web address]  Date: 06/12/2022 - Seated Hamstring Stretch  - 2 x daily - 7 x weekly - 3 reps - 30 hold  06/10/22 tandem stance  Access Code:  URL: [email or web address] 06/[email or web address]/2022  Side Stepping with Counter Support  - 1 x daily - 7 x weekly - 2 sets - 10 reps - Heel Raises with Counter Support  - 1 x daily - 7 x weekly - 2 sets - 10 reps - Mini Squat with Counter Support  - 1 x daily - 7 x weekly - 2 sets - 10 rep  Date: 06/02/2022 Prepared by: Virgina Organ  Exercises - Supine Transversus Abdominis Bracing - Hands on Stomach  - 2 x daily - 7 x weekly - 1 sets - 10 reps - 5" hold - Beginner Bridge  - 2 x daily - 7 x weekly - 1 sets - 10 reps - 5" hold - Hooklying Clamshell with Resistance  - 2 x daily - 7 x weekly - 1 sets - 10 reps - 5" hold - Supine Single Knee to Chest Stretch  - 1 x daily - 7 x weekly - 1 sets - 3 reps - 30" hold - Seated Upright Posture Correction   -  1 x daily - 7 x weekly - 1 sets - 3 reps - 5" hold Date: 05/28/2022 - Seated Flexion Stretch  - 2-3 x daily - 7 x weekly - 10 reps - 5 second hold - Standing Hip Abduction with Counter Support  - 2-3 x daily - 7 x weekly - 2 sets - 10 reps - Standing Hip Extension with Counter Support  - 2-3 x daily - 7 x weekly - 3 sets - 10 reps  ASSESSMENT:  CLINICAL IMPRESSION: Interventions today were geared towards Jason strengthening and flexibility. Patient was able to demonstrate alternating pattern on ascending/descending stairs with only slight to mild unsteadiness even with 1 UE assist particularly in descending due to impaired proprioception.  Demonstrated mild levels of fatigue. Patient is able to clear the hurdles without hitting them on all 4 rounds indicating good Jason clearance and strength. Rest periods provided. Provided mild amount of cueing to ensure correct execution of activity with good carry-over. To date, skilled PT is required to address the impairments and improve function.   OBJECTIVE IMPAIRMENTS: Abnormal gait, decreased activity tolerance, decreased balance, decreased endurance, decreased mobility, difficulty walking, decreased ROM, decreased strength, improper body mechanics, postural dysfunction, and pain.   ACTIVITY LIMITATIONS: carrying, lifting, bending, standing, squatting, stairs, transfers, locomotion level, and caring for others  PARTICIPATION LIMITATIONS: meal prep, cleaning, laundry, shopping, community activity, and yard work  PERSONAL FACTORS: Age, Time since onset of injury/illness/exacerbation, and 3+ comorbidities: CAD, HTN, HLD, Hx MI, lumbar spondylosis  are also affecting patient's functional outcome.   REHAB POTENTIAL: Good  CLINICAL DECISION MAKING: Stable/uncomplicated  EVALUATION COMPLEXITY: Low   GOALS: Goals reviewed with patient? Yes  SHORT TERM GOALS: Target date: 06/18/2022    Patient will be independent with HEP in order to improve functional  outcomes. Baseline:  Goal status: MET  2.  Patient will report at least 25% improvement in symptoms for improved quality of life. Baseline:  Goal status: MET   LONG TERM GOALS: Target Date: 07/09/2022    Patient will report at least 75% improvement in symptoms for improved quality of life. Baseline:  Goal status: IN PROGRESS  2.  Patient will be able to complete 5x STS in under 11.4 seconds in order to reduce the risk of falls. Baseline: 16.73 seconds without UE support 07/01/22 11.18  seconds without UE support Goal status: MET  3.  Patient will demonstrate at least 25% improvement in lumbar ROM in all restricted planes for improved ability to move trunk while completing chores. Baseline: see above Goal status: IN PROGRESS  4.  Patient will be able to ambulate at least 400 feet in in order to demonstrate improved tolerance to activity. Baseline: 275 feet 07/01/22 410 feet Goal status: MET  5.  Patient will demonstrate grade of 5/5 MMT grade in all tested musculature as evidence of improved strength to assist with stair ambulation and gait.   Baseline: see above  Goal status: IN PROGRESS   PLAN:  PT FREQUENCY: 2x/week  PT DURATION: 6 weeks additional 4 weeks   PLANNED INTERVENTIONS: Therapeutic exercises, Therapeutic activity, Neuromuscular re-education, Balance training, Gait training, Patient/Family education, Joint manipulation, Joint mobilization, Stair training, Orthotic/Fit training, DME instructions, Aquatic Therapy, Dry Needling, Electrical stimulation, Spinal manipulation, Spinal mobilization, Cryotherapy, Moist heat, Compression bandaging, scar mobilization, Splintting, Taping, Traction, Ultrasound, Ionotophoresis 4mg /ml Dexamethasone, and Manual therapy  PLAN FOR NEXT SESSION: Work on ability to ascend and descend steps in a reciprocal manner to allow pt to visit family.  Continue with LT Jason strengthening   Iantha Fallen L. Eastyn Skalla, PT, DPT, OCS Board-Certified  Clinical Specialist in Orthopedic PT PT Compact Privilege # ([place]): [phone] T 9:01 am

## 2022-07-22 ENCOUNTER — Ambulatory Visit (HOSPITAL_COMMUNITY): Payer: Medicare HMO | Admitting: Physical Therapy

## 2022-07-22 DIAGNOSIS — R2689 Other abnormalities of gait and mobility: Secondary | ICD-10-CM | POA: Diagnosis not present

## 2022-07-22 DIAGNOSIS — M6281 Muscle weakness (generalized): Secondary | ICD-10-CM

## 2022-07-22 NOTE — Therapy (Signed)
OUTPATIENT PHYSICAL THERAPY THORACOLUMBAR treatment   Patient Name: Jason Johnson MRN: [id number] DOB:01-Feb-1941, 82 y.o., male Today's Date: 07/22/2022  END OF SESSION:   PT End of Session - 07/22/22 0853     Visit Number 15    Number of Visits 20    Date for PT Re-Evaluation 08/05/22    Authorization Type [employer] (no VL, no auth)    Progress Note Due on Visit 20    PT Start Time [phone]    PT Stop Time 0900    PT Time Calculation (min) 44 min    Equipment Utilized During Treatment Gait belt    Activity Tolerance Patient tolerated treatment well    Behavior During Therapy WFL for tasks assessed/performed                Past Medical History:  Diagnosis Date   Carotid artery disease (HCC)    Coronary artery disease    history of coronary artery bypass grafting in 1999   Gout    Hyperlipidemia    Hypertension    Myocardial infarction [facility]) 2009   Past Surgical History:  Procedure Laterality Date   CARDIAC CATHETERIZATION  2009   CATARACT EXTRACTION W/PHACO Right 09/09/2012   Procedure: CATARACT EXTRACTION PHACO AND INTRAOCULAR LENS PLACEMENT (IOC);  Surgeon: Gemma Payor, MD;  Location: [facility];  Service: Ophthalmology;  Laterality: Right;  CDE 14.48   CATARACT EXTRACTION W/PHACO Left 10/07/2012   Procedure: CATARACT EXTRACTION PHACO AND INTRAOCULAR LENS PLACEMENT (IOC);  Surgeon: Gemma Payor, MD;  Location: [facility];  Service: Ophthalmology;  Laterality: Left;  CDE:12.24   COLONOSCOPY     COLONOSCOPY N/A 02/11/2017   Procedure: COLONOSCOPY;  Surgeon: Corbin Ade, MD;  Location: [facility];  Service: Endoscopy;  Laterality: N/A;  7:30am   CORONARY ARTERY BYPASS GRAFT  2004   triple   DOPPLER ECHOCARDIOGRAPHY  2010   NM MYOVIEW LTD  2012   TONSILLECTOMY     as a child   Patient Active Problem List   Diagnosis Date Noted   Peripheral neuropathy 05/23/2022   Lumbar spondylosis 05/23/2022   Gait abnormality 02/10/2022   Paresthesia 02/10/2022   Weakness  02/10/2022   Irregular heartbeat 05/04/2019   Diverticula of colon 04/13/2017   Internal hemorrhoids 04/13/2017   Rectal bleeding 01/06/2017   History of arteriosclerotic cardiovascular disease 01/06/2017   Coronary artery disease 07/06/2013   Carotid artery disease 07/06/2013   Essential hypertension 07/06/2013   Hyperlipidemia 07/06/2013    PCP: Elfredia Nevins MD  REFERRING PROVIDER: Levert Feinstein, MD  REFERRING DIAG: R26.9 (ICD-10-CM) - Gait abnormality G62.89 (ICD-10-CM) - Other polyneuropathy M47.816 (ICD-10-CM) - Lumbar spondylosis  Rationale for Evaluation and Treatment: Rehabilitation  THERAPY DIAG:  Other abnormalities of gait and mobility  Muscle weakness (generalized)  ONSET DATE: 1 year  SUBJECTIVE STATEMENT: Pt is working out at [employer] and has mm aches from this but it works out every day.  PERTINENT HISTORY:  CAD, HTN, HLD, Hx MI  PAIN:  Are you having pain? No  PRECAUTIONS: None  WEIGHT BEARING RESTRICTIONS: No  FALLS:  Has patient fallen in last 6 months? No  OCCUPATION: Retired  PLOF: Independent  PATIENT GOALS: move better, walk, exercise  NEXT MD VISIT: none scheduled  OBJECTIVE:   DIAGNOSTIC FINDINGS:  MRI 04/10/22: IMPRESSION: This MRI of the lumbar spine without contrast shows the following: At L2-L3, there is severe loss of disc height associated with mild retrolisthesis.  There is moderately severe right lateral recess stenosis with impingement of the right L3 nerve root.  Compared to the 2010 MRI, this level has slightly progressed. At L3-L4, there are degenerative changes causing moderately severe left lateral recess stenosis that could impinge upon the left L4 nerve root.  Degenerative changes at this level have mildly progressed compared to the 2010  MRI. At L4-L5, there is a right paramedian disc protrusion and other degenerative change causing mild spinal stenosis and moderately severe left lateral recess stenosis which could impinge upon the left L5 nerve root.  Degenerative changes have mildly progressed at this level At L5-S1, there is very minimal retrolisthesis and other degenerative change but no spinal stenosis or nerve root compression.  Degenerative changes at this level have improved compared to the 2010 MRI with resolution of the right S1 nerve root impingement  POSTURE: decreased lumbar lordosis  PALPATION: Minimal soreness in lumbar paraspinals, not tenderness in glutes bilaterally  LUMBAR ROM:   AROM eval 07/01/22  Flexion 50% limited 50% limited  Extension 50% limited 50% limited  Right lateral flexion 50% limited 25% limited  Left lateral flexion 50% limited 25% limited  Right rotation    Left rotation     (Blank rows = not tested)  LOWER EXTREMITY ROM:   Lacks TKE on L    LOWER EXTREMITY MMT:    MMT Right eval Left eval Right 07/01/22 Left 07/01/22  Hip flexion 4+ 4 5 5   Hip extension 4- 3+ 4 4-  Hip abduction 3+ 3+ 3+ 4  Hip adduction      Hip internal rotation      Hip external rotation      Knee flexion 5 4+ 5 5  Knee extension 5 4+ 5 5  Ankle dorsiflexion 5 4+ 5 5  Ankle plantarflexion      Ankle inversion      Ankle eversion       (Blank rows = not tested)   FUNCTIONAL TESTS:  5 times sit to stand: 16.73 seconds without UE support 2 minute walk test: 275  GAIT: Distance walked: 275 feet  Assistive device utilized: None Level of assistance: Complete Independence Comments: , mild Trendelenburg on L, trunk slightly flexed, mild fatigue following   Reassessment 07/01/22 FUNCTIONAL TESTS:  5 times sit to stand: 11.18  seconds without UE support 2 minute walk test: 410 feet TODAY'S TREATMENT:  DATE:  07/22/22 Wall arch x 10  Hurdles 8" round trip x 2 Tandem stance 30" with RT in front then Lt x 2 set Sit to stand no UE assist x 10 Side step/forward step/retro 3 plate x 4  Vector stance x 3 5" hold  two finger  Functional squat  x 10 Reciprocal stairs x 2 RT  Hip and thoracic excursions x 3 each.    07/17/22 Nustep seat 11 arms 8 level 5 x 5' dynamic warm up Seated hamstring stretch x 30" x 3 Gastrocnemius slant board stretch x 30" x 3 Heel/toe raises x 20 x 2 lbs Tandem stance on foam, eyes open x 30" x 3 on each Stair negotiation 7" steps, 7 steps x 4 rounds, CGA-SBA, 1 UE support Forward stepping over 4" hurdles, alt pattern x 5 rounds inside // bars, no UE assist Side stepping with  blue TB on ankles, 3 rounds inside // bars, no UE assist Forward and retro walks, 20 lbs (2 plates) x 4 rounds  07/15/22 Nustep seat 11 arms 8 level 5 x 5' dynamic warm up Seated hamstring stretch x 30" x 3 Gastrocnemius slant board stretch x 30" x 3 Heel/toe raises x 20 Tandem stance on foam, eyes open x 30" x 3 on each Stair negotiation 4" steps, 7 steps x 4 rounds, CGA, no UE support Forward stepping over 4" hurdles, alt pattern x 5 rounds inside // bars, no UE assist Side stepping with GTB on ankles, 3 rounds inside // bars, no UE assist Forward and retro walks, 20 lbs (2 plates) x 4 rounds  07/09/22 Nustep seat 11 arms 8 level 5 x 5' dynamic warm up Seated hamstring stretch x 30" x 3 Gastrocnemius slant board stretch x 30" x 3 Heel/toe raises x 20 Tandem stance on foam, eyes open x 30" x 3 on each Stair negotiation 4" steps, 7 steps x 4 rounds, CGA, no UE support Forward stepping over 4" hurdles, alt pattern x 5 rounds inside // bars, no UE assist Side stepping with GTB on ankles, 3 rounds inside // bars, no UE assist Forward and retro walks, 20 lbs (2 plates) x 4 rounds   07/03/22: Heel raise x 10 Rockerboard x 2 minutes for both RT/LT and  anterior/posterior Steps 4" x 2 RT Tandem stance both x 3 x each Squat x 10  Sit to stand x 10 Side stepping with green theraband x 2 rt      PATIENT EDUCATION: updated HEP  Education details: Patient educated on exam findings, POC, scope of PT, HEP, and general exercise. Person educated: Patient Education method: Explanation, Demonstration, and Handouts Education comprehension: verbalized understanding, returned demonstration, verbal cues required, and tactile cues required  HOME EXERCISE PROGRAM: Access Code: [email or web address] URL: [email or web address]  Date: 06/12/2022 - Seated Hamstring Stretch  - 2 x daily - 7 x weekly - 3 reps - 30 hold  06/10/22 tandem stance  Access Code:  URL: [email or web address] 06/[email or web address]/2022  Side Stepping with Counter Support  - 1 x daily - 7 x weekly - 2 sets - 10 reps - Heel Raises with Counter Support  - 1 x daily - 7 x weekly - 2 sets - 10 reps - Mini Squat with Counter Support  - 1 x daily - 7 x weekly - 2 sets - 10 rep  Date: 06/02/2022 Prepared by: Virgina Organ  Exercises - Supine Transversus Abdominis Bracing - Hands on Stomach  - 2 x daily - 7 x weekly -  1 sets - 10 reps - 5" hold - Beginner Bridge  - 2 x daily - 7 x weekly - 1 sets - 10 reps - 5" hold - Hooklying Clamshell with Resistance  - 2 x daily - 7 x weekly - 1 sets - 10 reps - 5" hold - Supine Single Knee to Chest Stretch  - 1 x daily - 7 x weekly - 1 sets - 3 reps - 30" hold - Seated Upright Posture Correction  - 1 x daily - 7 x weekly - 1 sets - 3 reps - 5" hold Date: 05/28/2022 - Seated Flexion Stretch  - 2-3 x daily - 7 x weekly - 10 reps - 5 second hold - Standing Hip Abduction with Counter Support  - 2-3 x daily - 7 x weekly - 2 sets - 10 reps - Standing Hip Extension with Counter Support  - 2-3 x daily - 7 x weekly - 3 sets - 10 reps  ASSESSMENT:  CLINICAL IMPRESSION: Interventions today were geared towards LE strengthening and flexibility. Patient  was able to demonstrate alternating pattern on ascending/descending stairs with continues to have mild unsteadiness  with 1 UE assist particularly in descending.  Demonstrated mild levels of fatigue. Therapist increased resistance and increased the height of the hurdles.  Added stretching to program.  Provided mild amount of cueing to ensure correct execution of activity with good carry-over. To date, skilled PT is required to address the impairments and improve function.   OBJECTIVE IMPAIRMENTS: Abnormal gait, decreased activity tolerance, decreased balance, decreased endurance, decreased mobility, difficulty walking, decreased ROM, decreased strength, improper body mechanics, postural dysfunction, and pain.   ACTIVITY LIMITATIONS: carrying, lifting, bending, standing, squatting, stairs, transfers, locomotion level, and caring for others  PARTICIPATION LIMITATIONS: meal prep, cleaning, laundry, shopping, community activity, and yard work  PERSONAL FACTORS: Age, Time since onset of injury/illness/exacerbation, and 3+ comorbidities: CAD, HTN, HLD, Hx MI, lumbar spondylosis  are also affecting patient's functional outcome.   REHAB POTENTIAL: Good  CLINICAL DECISION MAKING: Stable/uncomplicated  EVALUATION COMPLEXITY: Low   GOALS: Goals reviewed with patient? Yes  SHORT TERM GOALS: Target date: 06/18/2022    Patient will be independent with HEP in order to improve functional outcomes. Baseline:  Goal status: MET  2.  Patient will report at least 25% improvement in symptoms for improved quality of life. Baseline:  Goal status: MET   LONG TERM GOALS: Target Date: 07/09/2022    Patient will report at least 75% improvement in symptoms for improved quality of life. Baseline:  Goal status: IN PROGRESS  2.  Patient will be able to complete 5x STS in under 11.4 seconds in order to reduce the risk of falls. Baseline: 16.73 seconds without UE support 07/01/22 11.18  seconds without UE  support Goal status: MET  3.  Patient will demonstrate at least 25% improvement in lumbar ROM in all restricted planes for improved ability to move trunk while completing chores. Baseline: see above Goal status: IN PROGRESS  4.  Patient will be able to ambulate at least 400 feet in in order to demonstrate improved tolerance to activity. Baseline: 275 feet 07/01/22 410 feet Goal status: MET  5.  Patient will demonstrate grade of 5/5 MMT grade in all tested musculature as evidence of improved strength to assist with stair ambulation and gait.   Baseline: see above  Goal status: IN PROGRESS   PLAN:  PT FREQUENCY: 2x/week  PT DURATION: 6 weeks additional 4 weeks   PLANNED  INTERVENTIONS: Therapeutic exercises, Therapeutic activity, Neuromuscular re-education, Balance training, Gait training, Patient/Family education, Joint manipulation, Joint mobilization, Stair training, Orthotic/Fit training, DME instructions, Aquatic Therapy, Dry Needling, Electrical stimulation, Spinal manipulation, Spinal mobilization, Cryotherapy, Moist heat, Compression bandaging, scar mobilization, Splintting, Taping, Traction, Ultrasound, Ionotophoresis /ml Dexamethasone, and Manual therapy  PLAN FOR NEXT SESSION: Work on ability to ascend and descend steps in a reciprocal manner to allow pt to visit family.  Continue with LT LE strengthening  Virgina Organ, PT CLT [phone]

## 2022-07-24 ENCOUNTER — Encounter (HOSPITAL_COMMUNITY): Payer: Medicare HMO | Admitting: Physical Therapy

## 2022-07-25 ENCOUNTER — Ambulatory Visit (HOSPITAL_COMMUNITY): Payer: Medicare HMO | Admitting: Physical Therapy

## 2022-07-25 ENCOUNTER — Telehealth: Payer: Self-pay | Admitting: Cardiovascular Disease

## 2022-07-25 DIAGNOSIS — M6281 Muscle weakness (generalized): Secondary | ICD-10-CM

## 2022-07-25 DIAGNOSIS — R2689 Other abnormalities of gait and mobility: Secondary | ICD-10-CM | POA: Diagnosis not present

## 2022-07-25 NOTE — Therapy (Signed)
OUTPATIENT PHYSICAL THERAPY THORACOLUMBAR treatment   Patient Name: Jason Johnson MRN: [id number] DOB:1940-12-13, 82 y.o., male Today's Date: 07/25/2022  END OF SESSION:   PT End of Session - 07/25/22 0900    Visit Number 16    Number of Visits 20    Date for PT Re-Evaluation 08/05/22    Authorization Type [employer] (no VL, no auth)    Progress Note Due on Visit 20    PT Start Time [phone]    PT Stop Time 0900    PT Time Calculation (min) 44 min    Equipment Utilized During Treatment Gait belt    Activity Tolerance Patient tolerated treatment well    Behavior During Therapy WFL for tasks assessed/performed                 Past Medical History:  Diagnosis Date   Carotid artery disease (HCC)    Coronary artery disease    history of coronary artery bypass grafting in 1999   Gout    Hyperlipidemia    Hypertension    Myocardial infarction [facility]) 2009   Past Surgical History:  Procedure Laterality Date   CARDIAC CATHETERIZATION  2009   CATARACT EXTRACTION W/PHACO Right 09/09/2012   Procedure: CATARACT EXTRACTION PHACO AND INTRAOCULAR LENS PLACEMENT (IOC);  Surgeon: Gemma Payor, MD;  Location: [facility];  Service: Ophthalmology;  Laterality: Right;  CDE 14.48   CATARACT EXTRACTION W/PHACO Left 10/07/2012   Procedure: CATARACT EXTRACTION PHACO AND INTRAOCULAR LENS PLACEMENT (IOC);  Surgeon: Gemma Payor, MD;  Location: [facility];  Service: Ophthalmology;  Laterality: Left;  CDE:12.24   COLONOSCOPY     COLONOSCOPY N/A 02/11/2017   Procedure: COLONOSCOPY;  Surgeon: Corbin Ade, MD;  Location: [facility];  Service: Endoscopy;  Laterality: N/A;  7:30am   CORONARY ARTERY BYPASS GRAFT  2004   triple   DOPPLER ECHOCARDIOGRAPHY  2010   NM MYOVIEW LTD  2012   TONSILLECTOMY     as a child   Patient Active Problem List   Diagnosis Date Noted   Peripheral neuropathy 05/23/2022   Lumbar spondylosis 05/23/2022   Gait abnormality 02/10/2022   Paresthesia 02/10/2022   Weakness  02/10/2022   Irregular heartbeat 05/04/2019   Diverticula of colon 04/13/2017   Internal hemorrhoids 04/13/2017   Rectal bleeding 01/06/2017   History of arteriosclerotic cardiovascular disease 01/06/2017   Coronary artery disease 07/06/2013   Carotid artery disease 07/06/2013   Essential hypertension 07/06/2013   Hyperlipidemia 07/06/2013    PCP: Elfredia Nevins MD  REFERRING PROVIDER: Levert Feinstein, MD  REFERRING DIAG: R26.9 (ICD-10-CM) - Gait abnormality G62.89 (ICD-10-CM) - Other polyneuropathy M47.816 (ICD-10-CM) - Lumbar spondylosis  Rationale for Evaluation and Treatment: Rehabilitation  THERAPY DIAG:  Other abnormalities of gait and mobility  Muscle weakness (generalized)  ONSET DATE: 1 year  SUBJECTIVE STATEMENT: Pt states that moving around his home is getting easier. PERTINENT HISTORY:  CAD, HTN, HLD, Hx MI  PAIN:  Are you having pain? No  PRECAUTIONS: None  WEIGHT BEARING RESTRICTIONS: No  FALLS:  Has patient fallen in last 6 months? No  OCCUPATION: Retired  PLOF: Independent  PATIENT GOALS: move better, walk, exercise  NEXT MD VISIT: none scheduled  OBJECTIVE:   DIAGNOSTIC FINDINGS:  MRI 04/10/22: IMPRESSION: This MRI of the lumbar spine without contrast shows the following: At L2-L3, there is severe loss of disc height associated with mild retrolisthesis.  There is moderately severe right lateral recess stenosis with impingement of the right L3 nerve root.  Compared to the 2010 MRI, this level has slightly progressed. At L3-L4, there are degenerative changes causing moderately severe left lateral recess stenosis that could impinge upon the left L4 nerve root.  Degenerative changes at this level have mildly progressed compared to the 2010 MRI. At L4-L5, there is a right  paramedian disc protrusion and other degenerative change causing mild spinal stenosis and moderately severe left lateral recess stenosis which could impinge upon the left L5 nerve root.  Degenerative changes have mildly progressed at this level At L5-S1, there is very minimal retrolisthesis and other degenerative change but no spinal stenosis or nerve root compression.  Degenerative changes at this level have improved compared to the 2010 MRI with resolution of the right S1 nerve root impingement  POSTURE: decreased lumbar lordosis  PALPATION: Minimal soreness in lumbar paraspinals, not tenderness in glutes bilaterally  LUMBAR ROM:   AROM eval 07/01/22  Flexion 50% limited 50% limited  Extension 50% limited 50% limited  Right lateral flexion 50% limited 25% limited  Left lateral flexion 50% limited 25% limited  Right rotation    Left rotation     (Blank rows = not tested)  LOWER EXTREMITY ROM:   Lacks TKE on L    LOWER EXTREMITY MMT:    MMT Right eval Left eval Right 07/01/22 Left 07/01/22  Hip flexion 4+ Hip extension 4- 3+ 4 4-  Hip abduction 3+ 3+ 3+ 4  Hip adduction      Hip internal rotation      Hip external rotation      Knee flexion 5 4+ 5 5  Knee extension 5 4+ 5 5  Ankle dorsiflexion 5 4+ 5 5  Ankle plantarflexion      Ankle inversion      Ankle eversion       (Blank rows = not tested)   FUNCTIONAL TESTS:  5 times sit to stand: 16.73 seconds without UE support 2 minute walk test: 275  GAIT: Distance walked: 275 feet  Assistive device utilized: None Level of assistance: Complete Independence Comments: , mild Trendelenburg on L, trunk slightly flexed, mild fatigue following   Reassessment 07/01/22 FUNCTIONAL TESTS:  5 times sit to stand: 11.18  seconds without UE support 2 minute walk test: 410 feet TODAY'S TREATMENT:  DATE:  07/25/22 Standing: Isometric hip abduction against the wall B hold 5" x 10 Reciprocal stair climbing x 5 Lunging onto second step 15 rep hold 5 " Wall arch x 15 Side step/forward step 4 plate x 3 reps  retro 3 plate x 3 reps Sit to stand x 15  Step over hurdles 6" x 2 RT   07/22/22 Wall arch x 10  Hurdles 8" round trip x 2 Tandem stance 30" with RT in front then Lt x 2 set Sit to stand no UE assist x 10 Side step/forward step/retro 3 plate x 4  Vector stance x 3 5" hold  two finger  Functional squat  x 10 Reciprocal stairs x 2 RT  Hip and thoracic excursions x 3 each.    07/17/22 Nustep seat 11 arms 8 level 5 x 5' dynamic warm up Seated hamstring stretch x 30" x 3 Gastrocnemius slant board stretch x 30" x 3 Heel/toe raises x 20 x 2 lbs Tandem stance on foam, eyes open x 30" x 3 on each Stair negotiation 7" steps, 7 steps x 4 rounds, CGA-SBA, 1 UE support Forward stepping over 4" hurdles, alt pattern x 5 rounds inside // bars, no UE assist Side stepping with  blue TB on ankles, 3 rounds inside // bars, no UE assist Forward and retro walks, 20 lbs (2 plates) x 4 rounds  07/15/22 Nustep seat 11 arms 8 level 5 x 5' dynamic warm up Seated hamstring stretch x 30" x 3 Gastrocnemius slant board stretch x 30" x 3 Heel/toe raises x 20 Tandem stance on foam, eyes open x 30" x 3 on each Stair negotiation 4" steps, 7 steps x 4 rounds, CGA, no UE support Forward stepping over 4" hurdles, alt pattern x 5 rounds inside // bars, no UE assist Side stepping with GTB on ankles, 3 rounds inside // bars, no UE assist Forward and retro walks, 20 lbs (2 plates) x 4 rounds  07/09/22 Nustep seat 11 arms 8 level 5 x 5' dynamic warm up Seated hamstring stretch x 30" x 3 Gastrocnemius slant board stretch x 30" x 3 Heel/toe raises x 20 Tandem stance on foam, eyes open x 30" x 3 on each Stair negotiation 4" steps, 7 steps x 4 rounds, CGA, no UE support Forward stepping over 4" hurdles, alt  pattern x 5 rounds inside // bars, no UE assist Side stepping with GTB on ankles, 3 rounds inside // bars, no UE assist Forward and retro walks, 20 lbs (2 plates) x 4 rounds   07/03/22: Heel raise x 10 Rockerboard x 2 minutes for both RT/LT and anterior/posterior Steps 4" x 2 RT Tandem stance both x 3 x each Squat x 10  Sit to stand x 10 Side stepping with green theraband x 2 rt      PATIENT EDUCATION: updated HEP  Education details: Patient educated on exam findings, POC, scope of PT, HEP, and general exercise. Person educated: Patient Education method: Explanation, Demonstration, and Handouts Education comprehension: verbalized understanding, returned demonstration, verbal cues required, and tactile cues required  HOME EXERCISE PROGRAM: Access Code: [email or web address] URL: [email or web address]  Date: 06/12/2022 - Seated Hamstring Stretch  - 2 x daily - 7 x weekly - 3 reps - 30 hold  06/10/22 tandem stance  Access Code:  URL: [email or web address] 06/04/2022  Side Stepping with Counter Support  - 1 x daily - 7 x weekly - 2 sets - 10 reps - Heel Raises with Counter Support  - 1  x daily - 7 x weekly - 2 sets - 10 reps - Mini Squat with Counter Support  - 1 x daily - 7 x weekly - 2 sets - 10 rep  Date: 06/02/2022 Prepared by: Virgina Organ  Exercises - Supine Transversus Abdominis Bracing - Hands on Stomach  - 2 x daily - 7 x weekly - 1 sets - 10 reps - 5" hold - Beginner Bridge  - 2 x daily - 7 x weekly - 1 sets - 10 reps - 5" hold - Hooklying Clamshell with Resistance  - 2 x daily - 7 x weekly - 1 sets - 10 reps - 5" hold - Supine Single Knee to Chest Stretch  - 1 x daily - 7 x weekly - 1 sets - 3 reps - 30" hold - Seated Upright Posture Correction  - 1 x daily - 7 x weekly - 1 sets - 3 reps - 5" hold Date: 05/28/2022 - Seated Flexion Stretch  - 2-3 x daily - 7 x weekly - 10 reps - 5 second hold - Standing Hip Abduction with Counter Support  - 2-3 x  daily - 7 x weekly - 2 sets - 10 reps - Standing Hip Extension with Counter Support  - 2-3 x daily - 7 x weekly - 3 sets - 10 reps  ASSESSMENT:  CLINICAL IMPRESSION: Increased reps and wts as tolerated to pt treatmeht. Patient was able to demonstrate alternating pattern on ascending/descending stairs with improved steadiness  with 1 UE assist.  Demonstrated mild levels of fatigue.   Provided mild amount of cueing to ensure correct execution of activity with good carry-over. To date, skilled PT is required to address the impairments and improve function.   OBJECTIVE IMPAIRMENTS: Abnormal gait, decreased activity tolerance, decreased balance, decreased endurance, decreased mobility, difficulty walking, decreased ROM, decreased strength, improper body mechanics, postural dysfunction, and pain.   ACTIVITY LIMITATIONS: carrying, lifting, bending, standing, squatting, stairs, transfers, locomotion level, and caring for others  PARTICIPATION LIMITATIONS: meal prep, cleaning, laundry, shopping, community activity, and yard work  PERSONAL FACTORS: Age, Time since onset of injury/illness/exacerbation, and 3+ comorbidities: CAD, HTN, HLD, Hx MI, lumbar spondylosis  are also affecting patient's functional outcome.   REHAB POTENTIAL: Good  CLINICAL DECISION MAKING: Stable/uncomplicated  EVALUATION COMPLEXITY: Low   GOALS: Goals reviewed with patient? Yes  SHORT TERM GOALS: Target date: 06/18/2022    Patient will be independent with HEP in order to improve functional outcomes. Baseline:  Goal status: MET  2.  Patient will report at least 25% improvement in symptoms for improved quality of life. Baseline:  Goal status: MET   LONG TERM GOALS: Target Date: 07/09/2022    Patient will report at least 75% improvement in symptoms for improved quality of life. Baseline:  Goal status: IN PROGRESS  2.  Patient will be able to complete 5x STS in under 11.4 seconds in order to reduce the risk of  falls. Baseline: 16.73 seconds without UE support 07/01/22 11.18  seconds without UE support Goal status: MET  3.  Patient will demonstrate at least 25% improvement in lumbar ROM in all restricted planes for improved ability to move trunk while completing chores. Baseline: see above Goal status: IN PROGRESS  4.  Patient will be able to ambulate at least 400 feet in in order to demonstrate improved tolerance to activity. Baseline: 275 feet 07/01/22 410 feet Goal status: MET  5.  Patient will demonstrate grade of 5/5 MMT grade in all tested  musculature as evidence of improved strength to assist with stair ambulation and gait.   Baseline: see above  Goal status: IN PROGRESS   PLAN:  PT FREQUENCY: 2x/week  PT DURATION: 6 weeks additional 4 weeks   PLANNED INTERVENTIONS: Therapeutic exercises, Therapeutic activity, Neuromuscular re-education, Balance training, Gait training, Patient/Family education, Joint manipulation, Joint mobilization, Stair training, Orthotic/Fit training, DME instructions, Aquatic Therapy, Dry Needling, Electrical stimulation, Spinal manipulation, Spinal mobilization, Cryotherapy, Moist heat, Compression bandaging, scar mobilization, Splintting, Taping, Traction, Ultrasound, Ionotophoresis 4mg /ml Dexamethasone, and Manual therapy  PLAN FOR NEXT SESSION: Work on ability to ascend and descend steps in a reciprocal manner to allow pt to visit family.  Continue with LT LE strengthening  Virgina Organ, PT CLT [phone]

## 2022-07-25 NOTE — Telephone Encounter (Signed)
Pt states he would like Dr. Allyson Sabal to put in an order for a carotid doppler study. He also wants to let Dr. Allyson Sabal know that he's had the study done that Dr. Allyson Sabal referred the pt to do, a lower vasculare extremity test. He just wants to make sure Dr. Magnus Sinning PA is aware for his appt on 5/29.

## 2022-07-25 NOTE — Telephone Encounter (Signed)
Called patient, advised that I would check on the order for the carotid study, I will send a message to our scheduling team. Due for 12 mon f/u.   Carotid order in.

## 2022-07-29 ENCOUNTER — Ambulatory Visit (HOSPITAL_COMMUNITY): Payer: Medicare HMO

## 2022-07-29 DIAGNOSIS — R2689 Other abnormalities of gait and mobility: Secondary | ICD-10-CM | POA: Diagnosis not present

## 2022-07-29 DIAGNOSIS — M6281 Muscle weakness (generalized): Secondary | ICD-10-CM

## 2022-07-29 NOTE — Therapy (Signed)
OUTPATIENT PHYSICAL THERAPY THORACOLUMBAR treatment   Patient Name: Jason Johnson MRN: [id number] DOB:June 29, 1940, 82 y.o., male Today's Date: 07/29/2022  END OF SESSION:   PT End of Session - 07/25/22 0900    Visit Number 16    Number of Visits 20    Date for PT Re-Evaluation 08/05/22    Authorization Type [employer] (no VL, no auth)    Progress Note Due on Visit 20    PT Start Time [phone]    PT Stop Time 0900    PT Time Calculation (min) 44 min    Equipment Utilized During Treatment Gait belt    Activity Tolerance Patient tolerated treatment well    Behavior During Therapy WFL for tasks assessed/performed                 Past Medical History:  Diagnosis Date   Carotid artery disease (HCC)    Coronary artery disease    history of coronary artery bypass grafting in 1999   Gout    Hyperlipidemia    Hypertension    Myocardial infarction [facility]) 2009   Past Surgical History:  Procedure Laterality Date   CARDIAC CATHETERIZATION  2009   CATARACT EXTRACTION W/PHACO Right 09/09/2012   Procedure: CATARACT EXTRACTION PHACO AND INTRAOCULAR LENS PLACEMENT (IOC);  Surgeon: Gemma Payor, MD;  Location: [facility];  Service: Ophthalmology;  Laterality: Right;  CDE 14.48   CATARACT EXTRACTION W/PHACO Left 10/07/2012   Procedure: CATARACT EXTRACTION PHACO AND INTRAOCULAR LENS PLACEMENT (IOC);  Surgeon: Gemma Payor, MD;  Location: [facility];  Service: Ophthalmology;  Laterality: Left;  CDE:12.24   COLONOSCOPY     COLONOSCOPY N/A 02/11/2017   Procedure: COLONOSCOPY;  Surgeon: Corbin Ade, MD;  Location: [facility];  Service: Endoscopy;  Laterality: N/A;  7:30am   CORONARY ARTERY BYPASS GRAFT  2004   triple   DOPPLER ECHOCARDIOGRAPHY  2010   NM MYOVIEW LTD  2012   TONSILLECTOMY     as a child   Patient Active Problem List   Diagnosis Date Noted   Peripheral neuropathy 05/23/2022   Lumbar spondylosis 05/23/2022   Gait abnormality 02/10/2022   Paresthesia 02/10/2022   Weakness  02/10/2022   Irregular heartbeat 05/04/2019   Diverticula of colon 04/13/2017   Internal hemorrhoids 04/13/2017   Rectal bleeding 01/06/2017   History of arteriosclerotic cardiovascular disease 01/06/2017   Coronary artery disease 07/06/2013   Carotid artery disease 07/06/2013   Essential hypertension 07/06/2013   Hyperlipidemia 07/06/2013    PCP: Elfredia Nevins MD  REFERRING PROVIDER: Levert Feinstein, MD  REFERRING DIAG: R26.9 (ICD-10-CM) - Gait abnormality G62.89 (ICD-10-CM) - Other polyneuropathy M47.816 (ICD-10-CM) - Lumbar spondylosis  Rationale for Evaluation and Treatment: Rehabilitation  THERAPY DIAG:  Other abnormalities of gait and mobility  Muscle weakness (generalized)  ONSET DATE: 1 year  SUBJECTIVE STATEMENT: Patient states he is now going to the [facility]; doing the Nustep and using some machines; trying to do more walking at home.     PERTINENT HISTORY:  CAD, HTN, HLD, Hx MI  PAIN:  Are you having pain? No  PRECAUTIONS: None  WEIGHT BEARING RESTRICTIONS: No  FALLS:  Has patient fallen in last 6 months? No  OCCUPATION: Retired  PLOF: Independent  PATIENT GOALS: move better, walk, exercise  NEXT MD VISIT: none scheduled  OBJECTIVE:   DIAGNOSTIC FINDINGS:  MRI 04/10/22: IMPRESSION: This MRI of the lumbar spine without contrast shows the following: At L2-L3, there is severe loss of disc height associated with mild retrolisthesis.  There is moderately severe right lateral recess stenosis with impingement of the right L3 nerve root.  Compared to the 2010 MRI, this level has slightly progressed. At L3-L4, there are degenerative changes causing moderately severe left lateral recess stenosis that could impinge upon the left L4 nerve root.  Degenerative changes at this level have  mildly progressed compared to the 2010 MRI. At L4-L5, there is a right paramedian disc protrusion and other degenerative change causing mild spinal stenosis and moderately severe left lateral recess stenosis which could impinge upon the left L5 nerve root.  Degenerative changes have mildly progressed at this level At L5-S1, there is very minimal retrolisthesis and other degenerative change but no spinal stenosis or nerve root compression.  Degenerative changes at this level have improved compared to the 2010 MRI with resolution of the right S1 nerve root impingement  POSTURE: decreased lumbar lordosis  PALPATION: Minimal soreness in lumbar paraspinals, not tenderness in glutes bilaterally  LUMBAR ROM:   AROM eval 07/01/22  Flexion 50% limited 50% limited  Extension 50% limited 50% limited  Right lateral flexion 50% limited 25% limited  Left lateral flexion 50% limited 25% limited  Right rotation    Left rotation     (Blank rows = not tested)  LOWER EXTREMITY ROM:   Lacks TKE on L    LOWER EXTREMITY MMT:    MMT Right eval Left eval Right 07/01/22 Left 07/01/22  Hip flexion 4+ 4 5 5   Hip extension 4- 3+ 4 4-  Hip abduction 3+ 3+ 3+ 4  Hip adduction      Hip internal rotation      Hip external rotation      Knee flexion 5 4+ 5 5  Knee extension 5 4+ 5 5  Ankle dorsiflexion 5 4+ 5 5  Ankle plantarflexion      Ankle inversion      Ankle eversion       (Blank rows = not tested)   FUNCTIONAL TESTS:  5 times sit to stand: 16.73 seconds without UE support 2 minute walk test: 275  GAIT: Distance walked: 275 feet  Assistive device utilized: None Level of assistance: Complete Independence Comments: , mild Trendelenburg on L, trunk slightly flexed, mild fatigue following   Reassessment 07/01/22 FUNCTIONAL TESTS:  5 times sit to stand: 11.18  seconds without UE support 2 minute walk test: 410 feet TODAY'S TREATMENT:  DATE:  07/29/22 Sit to stand 2 x 10 Heel toe raises on incline 2 x 10 4 plate walkouts x 10 retro 3 plate walkouts sidestepping x 5 each way Standing on foam small bos x 1' no UE assist SLS on foam no UE assist x 10" each leg Hip vectors 2 finger hold 2 x 5 each 8" step navigation reciprocal pattern with 1 handrail assist    07/25/22 Standing: Isometric hip abduction against the wall B hold 5" x 10 Reciprocal stair climbing x 5 Lunging onto second step 15 rep hold 5 " Wall arch x 15 Side step/forward step 4 plate x 3 reps  retro 3 plate x 3 reps Sit to stand x 15  Step over hurdles 6" x 2 RT   07/22/22 Wall arch x 10  Hurdles 8" round trip x 2 Tandem stance 30" with RT in front then Lt x 2 set Sit to stand no UE assist x 10 Side step/forward step/retro 3 plate x 4  Vector stance x 3 5" hold  two finger  Functional squat  x 10 Reciprocal stairs x 2 RT  Hip and thoracic excursions x 3 each.    07/17/22 Nustep seat 11 arms 8 level 5 x 5' dynamic warm up Seated hamstring stretch x 30" x 3 Gastrocnemius slant board stretch x 30" x 3 Heel/toe raises x 20 x 2 lbs Tandem stance on foam, eyes open x 30" x 3 on each Stair negotiation 7" steps, 7 steps x 4 rounds, CGA-SBA, 1 UE support Forward stepping over 4" hurdles, alt pattern x 5 rounds inside // bars, no UE assist Side stepping with  blue TB on ankles, 3 rounds inside // bars, no UE assist Forward and retro walks, 20 lbs (2 plates) x 4 rounds  07/15/22 Nustep seat 11 arms 8 level 5 x 5' dynamic warm up Seated hamstring stretch x 30" x 3 Gastrocnemius slant board stretch x 30" x 3 Heel/toe raises x 20 Tandem stance on foam, eyes open x 30" x 3 on each Stair negotiation 4" steps, 7 steps x 4 rounds, CGA, no UE support Forward stepping over 4" hurdles, alt pattern x 5 rounds inside // bars, no UE assist Side stepping with GTB on ankles, 3 rounds inside //  bars, no UE assist Forward and retro walks, 20 lbs (2 plates) x 4 rounds  07/09/22 Nustep seat 11 arms 8 level 5 x 5' dynamic warm up Seated hamstring stretch x 30" x 3 Gastrocnemius slant board stretch x 30" x 3 Heel/toe raises x 20 Tandem stance on foam, eyes open x 30" x 3 on each Stair negotiation 4" steps, 7 steps x 4 rounds, CGA, no UE support Forward stepping over 4" hurdles, alt pattern x 5 rounds inside // bars, no UE assist Side stepping with GTB on ankles, 3 rounds inside // bars, no UE assist Forward and retro walks, 20 lbs (2 plates) x 4 rounds   07/03/22: Heel raise x 10 Rockerboard x 2 minutes for both RT/LT and anterior/posterior Steps 4" x 2 RT Tandem stance both x 3 x each Squat x 10  Sit to stand x 10 Side stepping with green theraband x 2 rt      PATIENT EDUCATION: updated HEP  Education details: Patient educated on exam findings, POC, scope of PT, HEP, and general exercise. Person educated: Patient Education method: Explanation, Demonstration, and Handouts Education comprehension: verbalized understanding, returned demonstration, verbal cues required, and tactile cues required  HOME  EXERCISE PROGRAM: Access Code: [email or web address] URL: [email or web address]  Date: 06/12/2022 - Seated Hamstring Stretch  - 2 x daily - 7 x weekly - 3 reps - 30 hold  06/10/22 tandem stance  Access Code:  URL: [email or web address] 06/[email or web address]/2022  Side Stepping with Counter Support  - 1 x daily - 7 x weekly - 2 sets - 10 reps - Heel Raises with Counter Support  - 1 x daily - 7 x weekly - 2 sets - 10 reps - Mini Squat with Counter Support  - 1 x daily - 7 x weekly - 2 sets - 10 rep  Date: 06/02/2022 Prepared by: Virgina Organ  Exercises - Supine Transversus Abdominis Bracing - Hands on Stomach  - 2 x daily - 7 x weekly - 1 sets - 10 reps - 5" hold - Beginner Bridge  - 2 x daily - 7 x weekly - 1 sets - 10 reps - 5" hold - Hooklying Clamshell with  Resistance  - 2 x daily - 7 x weekly - 1 sets - 10 reps - 5" hold - Supine Single Knee to Chest Stretch  - 1 x daily - 7 x weekly - 1 sets - 3 reps - 30" hold - Seated Upright Posture Correction  - 1 x daily - 7 x weekly - 1 sets - 3 reps - 5" hold Date: 05/28/2022 - Seated Flexion Stretch  - 2-3 x daily - 7 x weekly - 10 reps - 5 second hold - Standing Hip Abduction with Counter Support  - 2-3 x daily - 7 x weekly - 2 sets - 10 reps - Standing Hip Extension with Counter Support  - 2-3 x daily - 7 x weekly - 3 sets - 10 reps  ASSESSMENT:  CLINICAL IMPRESSION: continued to work on lower extremity strengthening and balance;  needs minimal cueing for posturing and technique; to not look at feet with balance activities.  Fatigue with walkouts and hip vectors noted; continues with some hip weakness especially hip abduction and extension.  Discussed with patient trying to practice steps at the [facility] where there is a flight of steps with railings to practice.  To date, skilled PT is required to address the impairments and improve function.   OBJECTIVE IMPAIRMENTS: Abnormal gait, decreased activity tolerance, decreased balance, decreased endurance, decreased mobility, difficulty walking, decreased ROM, decreased strength, improper body mechanics, postural dysfunction, and pain.   ACTIVITY LIMITATIONS: carrying, lifting, bending, standing, squatting, stairs, transfers, locomotion level, and caring for others  PARTICIPATION LIMITATIONS: meal prep, cleaning, laundry, shopping, community activity, and yard work  PERSONAL FACTORS: Age, Time since onset of injury/illness/exacerbation, and 3+ comorbidities: CAD, HTN, HLD, Hx MI, lumbar spondylosis  are also affecting patient's functional outcome.   REHAB POTENTIAL: Good  CLINICAL DECISION MAKING: Stable/uncomplicated  EVALUATION COMPLEXITY: Low   GOALS: Goals reviewed with patient? Yes  SHORT TERM GOALS: Target date: 06/18/2022    Patient will be  independent with HEP in order to improve functional outcomes. Baseline:  Goal status: MET  2.  Patient will report at least 25% improvement in symptoms for improved quality of life. Baseline:  Goal status: MET   LONG TERM GOALS: Target Date: 07/09/2022    Patient will report at least 75% improvement in symptoms for improved quality of life. Baseline:  Goal status: IN PROGRESS  2.  Patient will be able to complete 5x STS in under 11.4 seconds in order to reduce the risk of falls. Baseline: 16.73 seconds without UE support 07/01/22 11.18  seconds without UE support Goal status: MET  3.  Patient will demonstrate at least 25% improvement in lumbar ROM in all restricted planes for improved ability to move trunk while completing chores. Baseline: see above Goal status: IN PROGRESS  4.  Patient will be able to ambulate at least 400 feet in in order to demonstrate improved tolerance to activity. Baseline: 275 feet 07/01/22 410 feet Goal status: MET  5.  Patient will demonstrate grade of 5/5 MMT grade in all tested musculature as evidence of improved strength to assist with stair ambulation and gait.   Baseline: see above  Goal status: IN PROGRESS   PLAN:  PT FREQUENCY: 2x/week  PT DURATION: 6 weeks additional 4 weeks   PLANNED INTERVENTIONS: Therapeutic exercises, Therapeutic activity, Neuromuscular re-education, Balance training, Gait training, Patient/Family education, Joint manipulation, Joint mobilization, Stair training, Orthotic/Fit training, DME instructions, Aquatic Therapy, Dry Needling, Electrical stimulation, Spinal manipulation, Spinal mobilization, Cryotherapy, Moist heat, Compression bandaging, scar mobilization, Splintting, Taping, Traction, Ultrasound, Ionotophoresis /ml Dexamethasone, and Manual therapy  PLAN FOR NEXT SESSION: Work on ability to ascend and descend steps in a reciprocal manner to allow pt to visit family.  Continue with LT LE strengthening    8:15 AM, 07/29/22 Adaeze Better Small Terrance Lanahan MPT [facility] [place] [phone]

## 2022-07-31 ENCOUNTER — Ambulatory Visit (HOSPITAL_COMMUNITY): Payer: Medicare HMO

## 2022-07-31 DIAGNOSIS — R2689 Other abnormalities of gait and mobility: Secondary | ICD-10-CM | POA: Diagnosis not present

## 2022-07-31 DIAGNOSIS — M6281 Muscle weakness (generalized): Secondary | ICD-10-CM

## 2022-07-31 NOTE — Therapy (Signed)
OUTPATIENT PHYSICAL THERAPY THORACOLUMBAR treatment   Patient Name: Jason Johnson MRN: [id number] DOB:[birth year], 82 y.o., male Today's Date: 07/31/2022  END OF SESSION:   PT End of Session - 07/31/22 0816     Visit Number 18    Number of Visits 20    Date for PT Re-Evaluation 08/05/22    Authorization Type [employer] (no VL, no auth)    Progress Note Due on Visit 20    PT Start Time 0816    PT Stop Time 0856    PT Time Calculation (min) 40 min    Equipment Utilized During Treatment Gait belt    Activity Tolerance Patient tolerated treatment well    Behavior During Therapy WFL for tasks assessed/performed                   Past Medical History:  Diagnosis Date   Carotid artery disease (HCC)    Coronary artery disease    history of coronary artery bypass grafting in 1999   Gout    Hyperlipidemia    Hypertension    Myocardial infarction [facility]) 2009   Past Surgical History:  Procedure Laterality Date   CARDIAC CATHETERIZATION  2009   CATARACT EXTRACTION W/PHACO Right 09/09/2012   Procedure: CATARACT EXTRACTION PHACO AND INTRAOCULAR LENS PLACEMENT (IOC);  Surgeon: Gemma Payor, MD;  Location: [facility];  Service: Ophthalmology;  Laterality: Right;  CDE 14.48   CATARACT EXTRACTION W/PHACO Left 10/07/2012   Procedure: CATARACT EXTRACTION PHACO AND INTRAOCULAR LENS PLACEMENT (IOC);  Surgeon: Gemma Payor, MD;  Location: [facility];  Service: Ophthalmology;  Laterality: Left;  CDE:12.24   COLONOSCOPY     COLONOSCOPY N/A 02/11/2017   Procedure: COLONOSCOPY;  Surgeon: Corbin Ade, MD;  Location: [facility];  Service: Endoscopy;  Laterality: N/A;  7:30am   CORONARY ARTERY BYPASS GRAFT  2004   triple   DOPPLER ECHOCARDIOGRAPHY  2010   NM MYOVIEW LTD  2012   TONSILLECTOMY     as a child   Patient Active Problem List   Diagnosis Date Noted   Peripheral neuropathy 05/23/2022   Lumbar spondylosis 05/23/2022   Gait abnormality 02/10/2022   Paresthesia 02/10/2022    Weakness 02/10/2022   Irregular heartbeat 05/04/2019   Diverticula of colon 04/13/2017   Internal hemorrhoids 04/13/2017   Rectal bleeding 01/06/2017   History of arteriosclerotic cardiovascular disease 01/06/2017   Coronary artery disease 07/06/2013   Carotid artery disease 07/06/2013   Essential hypertension 07/06/2013   Hyperlipidemia 07/06/2013    PCP: Elfredia Nevins MD  REFERRING PROVIDER: Levert Feinstein, MD  REFERRING DIAG: R26.9 (ICD-10-CM) - Gait abnormality G62.89 (ICD-10-CM) - Other polyneuropathy M47.816 (ICD-10-CM) - Lumbar spondylosis  Rationale for Evaluation and Treatment: Rehabilitation  THERAPY DIAG:  Other abnormalities of gait and mobility  Muscle weakness (generalized)  ONSET DATE: 1 year  SUBJECTIVE STATEMENT: trying to go to the [facility] every day; feeling more confident with navigating the steps; he did practice going up and down the steps at the [facility] last time he went.   PERTINENT HISTORY:  CAD, HTN, HLD, Hx MI  PAIN:  Are you having pain? No  PRECAUTIONS: None  WEIGHT BEARING RESTRICTIONS: No  FALLS:  Has patient fallen in last 6 months? No  OCCUPATION: Retired  PLOF: Independent  PATIENT GOALS: move better, walk, exercise  NEXT MD VISIT: none scheduled  OBJECTIVE:   DIAGNOSTIC FINDINGS:  MRI 04/10/22: IMPRESSION: This MRI of the lumbar spine without contrast shows the following: At L2-L3, there is severe loss of disc height associated with mild retrolisthesis.  There is moderately severe right lateral recess stenosis with impingement of the right L3 nerve root.  Compared to the 2010 MRI, this level has slightly progressed. At L3-L4, there are degenerative changes causing moderately severe left lateral recess stenosis that could impinge upon the left L4 nerve  root.  Degenerative changes at this level have mildly progressed compared to the 2010 MRI. At L4-L5, there is a right paramedian disc protrusion and other degenerative change causing mild spinal stenosis and moderately severe left lateral recess stenosis which could impinge upon the left L5 nerve root.  Degenerative changes have mildly progressed at this level At L5-S1, there is very minimal retrolisthesis and other degenerative change but no spinal stenosis or nerve root compression.  Degenerative changes at this level have improved compared to the 2010 MRI with resolution of the right S1 nerve root impingement  POSTURE: decreased lumbar lordosis  PALPATION: Minimal soreness in lumbar paraspinals, not tenderness in glutes bilaterally  LUMBAR ROM:   AROM eval 07/01/22  Flexion 50% limited 50% limited  Extension 50% limited 50% limited  Right lateral flexion 50% limited 25% limited  Left lateral flexion 50% limited 25% limited  Right rotation    Left rotation     (Blank rows = not tested)  LOWER EXTREMITY ROM:   Lacks TKE on L    LOWER EXTREMITY MMT:    MMT Right eval Left eval Right 07/01/22 Left 07/01/22  Hip flexion 4+ 4 5 5   Hip extension 4- 3+ 4 4-  Hip abduction 3+ 3+ 3+ 4  Hip adduction      Hip internal rotation      Hip external rotation      Knee flexion 5 4+ 5 5  Knee extension 5 4+ 5 5  Ankle dorsiflexion 5 4+ 5 5  Ankle plantarflexion      Ankle inversion      Ankle eversion       (Blank rows = not tested)   FUNCTIONAL TESTS:  5 times sit to stand: 16.73 seconds without UE support 2 minute walk test: 275  GAIT: Distance walked: 275 feet  Assistive device utilized: None Level of assistance: Complete Independence Comments: , mild Trendelenburg on L, trunk slightly flexed, mild fatigue following   Reassessment 07/01/22 FUNCTIONAL TESTS:  5 times sit to stand: 11.18  seconds without UE support 2 minute walk test: 410 feet TODAY'S TREATMENT:  DATE:  07/31/22 Steps 4" no UE assist up and down x 2 reciprocal pattern Steps 8" 1 UE assist to descend; no UE assist to ascend reciprocal pattern x 2 Sit to stand 2 x 10 no UE assist Tandem stance on foam 2 x 30" each 4 plates retro walkouts x 8 4 plates sidestepping walkouts x 5 each Heel/toe raises on incline no UE assist 2 x 10 Rocker board x 1' each way f/b and s/s    07/29/22 Sit to stand 2 x 10 Heel toe raises on incline 2 x 10 4 plate walkouts x 10 retro 3 plate walkouts sidestepping x 5 each way Standing on foam small bos x 1' no UE assist SLS on foam no UE assist x 10" each leg Hip vectors 2 finger hold 2 x 5 each 8" step navigation reciprocal pattern with 1 handrail assist    07/25/22 Standing: Isometric hip abduction against the wall B hold 5" x 10 Reciprocal stair climbing x 5 Lunging onto second step 15 rep hold 5 " Wall arch x 15 Side step/forward step 4 plate x 3 reps  retro 3 plate x 3 reps Sit to stand x 15  Step over hurdles 6" x 2 RT   07/22/22 Wall arch x 10  Hurdles 8" round trip x 2 Tandem stance 30" with RT in front then Lt x 2 set Sit to stand no UE assist x 10 Side step/forward step/retro 3 plate x 4  Vector stance x 3 5" hold  two finger  Functional squat  x 10 Reciprocal stairs x 2 RT  Hip and thoracic excursions x 3 each.    07/17/22 Nustep seat 11 arms 8 level 5 x 5' dynamic warm up Seated hamstring stretch x 30" x 3 Gastrocnemius slant board stretch x 30" x 3 Heel/toe raises x 20 x 2 lbs Tandem stance on foam, eyes open x 30" x 3 on each Stair negotiation 7" steps, 7 steps x 4 rounds, CGA-SBA, 1 UE support Forward stepping over 4" hurdles, alt pattern x 5 rounds inside // bars, no UE assist Side stepping with  blue TB on ankles, 3 rounds inside // bars, no UE assist Forward and retro walks, 20 lbs (2 plates) x 4  rounds  07/15/22 Nustep seat 11 arms 8 level 5 x 5' dynamic warm up Seated hamstring stretch x 30" x 3 Gastrocnemius slant board stretch x 30" x 3 Heel/toe raises x 20 Tandem stance on foam, eyes open x 30" x 3 on each Stair negotiation 4" steps, 7 steps x 4 rounds, CGA, no UE support Forward stepping over 4" hurdles, alt pattern x 5 rounds inside // bars, no UE assist Side stepping with GTB on ankles, 3 rounds inside // bars, no UE assist Forward and retro walks, 20 lbs (2 plates) x 4 rounds  07/09/22 Nustep seat 11 arms 8 level 5 x 5' dynamic warm up Seated hamstring stretch x 30" x 3 Gastrocnemius slant board stretch x 30" x 3 Heel/toe raises x 20 Tandem stance on foam, eyes open x 30" x 3 on each Stair negotiation 4" steps, 7 steps x 4 rounds, CGA, no UE support Forward stepping over 4" hurdles, alt pattern x 5 rounds inside // bars, no UE assist Side stepping with GTB on ankles, 3 rounds inside // bars, no UE assist Forward and retro walks, 20 lbs (2 plates) x 4 rounds   07/03/22: Heel raise x 10 Rockerboard x 2 minutes for both  RT/LT and anterior/posterior Steps 4" x 2 RT Tandem stance both x 3 x each Squat x 10  Sit to stand x 10 Side stepping with green theraband x 2 rt      PATIENT EDUCATION: updated HEP  Education details: Patient educated on exam findings, POC, scope of PT, HEP, and general exercise. Person educated: Patient Education method: Explanation, Demonstration, and Handouts Education comprehension: verbalized understanding, returned demonstration, verbal cues required, and tactile cues required  HOME EXERCISE PROGRAM: Access Code: [email or web address] URL: [email or web address]  Date: 06/12/2022 - Seated Hamstring Stretch  - 2 x daily - 7 x weekly - 3 reps - 30 hold  06/10/22 tandem stance  Access Code:  URL: [email or web address] 06/[email or web address]/2022  Side Stepping with Counter Support  - 1 x daily - 7 x weekly - 2 sets - 10 reps - Heel  Raises with Counter Support  - 1 x daily - 7 x weekly - 2 sets - 10 reps - Mini Squat with Counter Support  - 1 x daily - 7 x weekly - 2 sets - 10 rep  Date: 06/02/2022 Prepared by: Virgina Organ  Exercises - Supine Transversus Abdominis Bracing - Hands on Stomach  - 2 x daily - 7 x weekly - 1 sets - 10 reps - 5" hold - Beginner Bridge  - 2 x daily - 7 x weekly - 1 sets - 10 reps - 5" hold - Hooklying Clamshell with Resistance  - 2 x daily - 7 x weekly - 1 sets - 10 reps - 5" hold - Supine Single Knee to Chest Stretch  - 1 x daily - 7 x weekly - 1 sets - 3 reps - 30" hold - Seated Upright Posture Correction  - 1 x daily - 7 x weekly - 1 sets - 3 reps - 5" hold Date: 05/28/2022 - Seated Flexion Stretch  - 2-3 x daily - 7 x weekly - 10 reps - 5 second hold - Standing Hip Abduction with Counter Support  - 2-3 x daily - 7 x weekly - 2 sets - 10 reps - Standing Hip Extension with Counter Support  - 2-3 x daily - 7 x weekly - 3 sets - 10 reps  ASSESSMENT:  CLINICAL IMPRESSION: continued to work on lower extremity strengthening and balance;  needs minimal cueing for standing tall with sit to stands; posturing and technique; to look up with balance activities.  Good challenge with tandem stance on foam; states he feels it most in his hips with balance exercise.  Increased plates with sidestepping walkouts with good challenge today. Max challenge with F/B on rocker board.   Will reassess next visit and discussed with patient likely discharge after next visit to I HEP and [employer] workouts.    OBJECTIVE IMPAIRMENTS: Abnormal gait, decreased activity tolerance, decreased balance, decreased endurance, decreased mobility, difficulty walking, decreased ROM, decreased strength, improper body mechanics, postural dysfunction, and pain.   ACTIVITY LIMITATIONS: carrying, lifting, bending, standing, squatting, stairs, transfers, locomotion level, and caring for others  PARTICIPATION LIMITATIONS: meal prep,  cleaning, laundry, shopping, community activity, and yard work  PERSONAL FACTORS: Age, Time since onset of injury/illness/exacerbation, and 3+ comorbidities: CAD, HTN, HLD, Hx MI, lumbar spondylosis  are also affecting patient's functional outcome.   REHAB POTENTIAL: Good  CLINICAL DECISION MAKING: Stable/uncomplicated  EVALUATION COMPLEXITY: Low   GOALS: Goals reviewed with patient? Yes  SHORT TERM GOALS: Target date: 06/18/2022    Patient will be independent with HEP in order to improve functional outcomes.  Baseline:  Goal status: MET  2.  Patient will report at least 25% improvement in symptoms for improved quality of life. Baseline:  Goal status: MET   LONG TERM GOALS: Target Date: 07/09/2022    Patient will report at least 75% improvement in symptoms for improved quality of life. Baseline:  Goal status: IN PROGRESS  2.  Patient will be able to complete 5x STS in under 11.4 seconds in order to reduce the risk of falls. Baseline: 16.73 seconds without UE support 07/01/22 11.18  seconds without UE support Goal status: MET  3.  Patient will demonstrate at least 25% improvement in lumbar ROM in all restricted planes for improved ability to move trunk while completing chores. Baseline: see above Goal status: IN PROGRESS  4.  Patient will be able to ambulate at least 400 feet in in order to demonstrate improved tolerance to activity. Baseline: 275 feet 07/01/22 410 feet Goal status: MET  5.  Patient will demonstrate grade of 5/5 MMT grade in all tested musculature as evidence of improved strength to assist with stair ambulation and gait.   Baseline: see above  Goal status: IN PROGRESS   PLAN:  PT FREQUENCY: 2x/week  PT DURATION: 6 weeks additional 4 weeks   PLANNED INTERVENTIONS: Therapeutic exercises, Therapeutic activity, Neuromuscular re-education, Balance training, Gait training, Patient/Family education, Joint manipulation, Joint mobilization, Stair  training, Orthotic/Fit training, DME instructions, Aquatic Therapy, Dry Needling, Electrical stimulation, Spinal manipulation, Spinal mobilization, Cryotherapy, Moist heat, Compression bandaging, scar mobilization, Splintting, Taping, Traction, Ultrasound, Ionotophoresis 4mg /ml Dexamethasone, and Manual therapy  PLAN FOR NEXT SESSION: Work on ability to ascend and descend steps in a reciprocal manner to allow pt to visit family.  Continue with LT LE strengthening; reassess next visit   8:55 AM, 07/31/22 Ellajane Stong Small Maddon Horton MPT  physical therapy [place] [phone]

## 2022-08-05 ENCOUNTER — Ambulatory Visit (HOSPITAL_COMMUNITY): Payer: Medicare HMO | Admitting: Physical Therapy

## 2022-08-05 DIAGNOSIS — R2689 Other abnormalities of gait and mobility: Secondary | ICD-10-CM

## 2022-08-05 DIAGNOSIS — M6281 Muscle weakness (generalized): Secondary | ICD-10-CM

## 2022-08-05 NOTE — Therapy (Signed)
OUTPATIENT PHYSICAL THERAPY THORACOLUMBAR treatment/discharge   Patient Name: Jason Johnson MRN: [id number] DOB:1940/08/06, 82 y.o., male Today's Date: 08/05/2022 PHYSICAL THERAPY DISCHARGE SUMMARY  Visits from Start of Care: 19  Current functional level related to goals / functional outcomes: See below    Remaining deficits: See below   Education / Equipment: HEP   Patient agrees to discharge. Patient goals were partially met. Patient is being discharged due to being pleased with the current functional level.  END OF SESSION:   PT End of Session - 08/05/22 900     Visit Number 19    Number of Visits 20    Date for PT Re-Evaluation 08/05/22    Authorization Type [employer] (no VL, no auth)    Progress Note Due on Visit 20    PT Start Time 0815    PT Stop Time 0900    PT Time Calculation (min) 45 min    Equipment Utilized During Treatment Gait belt    Activity Tolerance Patient tolerated treatment well    Behavior During Therapy WFL for tasks assessed/performed            Past Medical History:  Diagnosis Date   Carotid artery disease (HCC)    Coronary artery disease    history of coronary artery bypass grafting in 1999   Gout    Hyperlipidemia    Hypertension    Myocardial infarction [facility]) 2009   Past Surgical History:  Procedure Laterality Date   CARDIAC CATHETERIZATION  2009   CATARACT EXTRACTION W/PHACO Right 09/09/2012   Procedure: CATARACT EXTRACTION PHACO AND INTRAOCULAR LENS PLACEMENT (IOC);  Surgeon: Gemma Payor, MD;  Location: [facility] ORS;  Service: Ophthalmology;  Laterality: Right;  CDE 14.48   CATARACT EXTRACTION W/PHACO Left 10/07/2012   Procedure: CATARACT EXTRACTION PHACO AND INTRAOCULAR LENS PLACEMENT (IOC);  Surgeon: Gemma Payor, MD;  Location: [facility] ORS;  Service: Ophthalmology;  Laterality: Left;  CDE:12.24   COLONOSCOPY     COLONOSCOPY N/A 02/11/2017   Procedure: COLONOSCOPY;  Surgeon: Corbin Ade, MD;  Location: [facility] END[facility] SUITE;  Service:  Endoscopy;  Laterality: N/A;  7:30am   CORONARY ARTERY BYPASS GRAFT  2004   triple   DOPPLER ECHOCARDIOGRAPHY  2010   NM MYOVIEW LTD  2012   TONSILLECTOMY     as a child   Patient Active Problem List   Diagnosis Date Noted   Peripheral neuropathy 05/23/2022   Lumbar spondylosis 05/23/2022   Gait abnormality 02/10/2022   Paresthesia 02/10/2022   Weakness 02/10/2022   Irregular heartbeat 05/04/2019   Diverticula of colon 04/13/2017   Internal hemorrhoids 04/13/2017   Rectal bleeding 01/06/2017   History of arteriosclerotic cardiovascular disease 01/06/2017   Coronary artery disease 07/06/2013   Carotid artery disease (HCC) 07/06/2013   Essential hypertension 07/06/2013   Hyperlipidemia 07/06/2013    PCP: Elfredia Nevins MD  REFERRING PROVIDER: Levert Feinstein, MD  REFERRING DIAG: R26.9 (ICD-10-CM) - Gait abnormality G62.89 (ICD-10-CM) - Other polyneuropathy M47.816 (ICD-10-CM) - Lumbar spondylosis  Rationale for Evaluation and Treatment: Rehabilitation  THERAPY DIAG:  Other abnormalities of gait and mobility  Muscle weakness (generalized)  ONSET DATE: 1 year  SUBJECTIVE STATEMENT:   PERTINENT HISTORY:  CAD, HTN, HLD, Hx MI  PAIN:  Are you having pain? No  PRECAUTIONS: None  WEIGHT BEARING RESTRICTIONS: No  FALLS:  Has patient fallen in last 6 months? No  OCCUPATION: Retired  PLOF: Independent  PATIENT GOALS: move better, walk, exercise  NEXT MD VISIT: none scheduled  OBJECTIVE:   DIAGNOSTIC FINDINGS:  MRI 04/10/22: IMPRESSION: This MRI of the lumbar spine without contrast shows the following: At L2-L3, there is severe loss of disc height associated with mild retrolisthesis.  There is moderately severe right lateral recess stenosis with impingement of the right L3 nerve root.   Compared to the 2010 MRI, this level has slightly progressed. At L3-L4, there are degenerative changes causing moderately severe left lateral recess stenosis that could impinge upon the left L4 nerve root.  Degenerative changes at this level have mildly progressed compared to the 2010 MRI. At L4-L5, there is a right paramedian disc protrusion and other degenerative change causing mild spinal stenosis and moderately severe left lateral recess stenosis which could impinge upon the left L5 nerve root.  Degenerative changes have mildly progressed at this level At L5-S1, there is very minimal retrolisthesis and other degenerative change but no spinal stenosis or nerve root compression.  Degenerative changes at this level have improved compared to the 2010 MRI with resolution of the right S1 nerve root impingement  POSTURE: decreased lumbar lordosis  PALPATION: Minimal soreness in lumbar paraspinals, not tenderness in glutes bilaterally  LUMBAR ROM:   AROM eval 07/01/22  Flexion 50% limited 50% limited  Extension 50% limited 50% limited  Right lateral flexion 50% limited 25% limited  Left lateral flexion 50% limited 25% limited  Right rotation    Left rotation     (Blank rows = not tested)  LOWER EXTREMITY ROM:   Lacks TKE on L    LOWER EXTREMITY MMT:    MMT Right Eval 2/21 Left Eval 2/21 Right 07/01/22 Right  08/05/22 Left 07/01/22 Left 4/30  Hip flexion 4+ 4 5 5 5 5   Hip extension 4- 3+ 4 4 4- 4-  Hip abduction 3+ 3+ 3+ 4+/5  4 5   Hip adduction        Hip internal rotation        Hip external rotation        Knee flexion 5 4+ 5  5   Knee extension 5 4+ 5  5   Ankle dorsiflexion 5 4+ 5  5   Ankle plantarflexion        Ankle inversion        Ankle eversion         (Blank rows = not tested)   FUNCTIONAL TESTS:  5 times sit to stand: 16.73 seconds without UE support 2 minute walk test: 275  GAIT: Distance walked: 275 feet  Assistive device utilized: None Level of  assistance: Complete Independence Comments: , mild Trendelenburg on L, trunk slightly flexed, mild fatigue following   Reassessment 07/01/22 FUNCTIONAL TESTS:  5 times sit to stand: 11.18  seconds without UE support 2 minute walk test: 410 feet 08/05/22 30 second sit to stand:  10x;  5 times sit to stand: 11.18 2 minute walk:  432 ft  TODAY'S TREATMENT:  DATE:  08/05/22 Reassessment  Steps x 4 Nustep level 5 x 6', hills 3  Thoracic excursion  07/31/22 Steps 4" no UE assist up and down x 2 reciprocal pattern Steps 8" 1 UE assist to descend; no UE assist to ascend reciprocal pattern x 2 Sit to stand 2 x 10 no UE assist Tandem stance on foam 2 x 30" each 4 plates retro walkouts x 8 4 plates sidestepping walkouts x 5 each Heel/toe raises on incline no UE assist 2 x 10 Rocker board x 1' each way f/b and s/s    07/29/22 Sit to stand 2 x 10 Heel toe raises on incline 2 x 10 4 plate walkouts x 10 retro 3 plate walkouts sidestepping x 5 each way Standing on foam small bos x 1' no UE assist SLS on foam no UE assist x 10" each leg Hip vectors 2 finger hold 2 x 5 each 8" step navigation reciprocal pattern with 1 handrail assist    07/25/22 Standing: Isometric hip abduction against the wall B hold 5" x 10 Reciprocal stair climbing x 5 Lunging onto second step 15 rep hold 5 " Wall arch x 15 Side step/forward step 4 plate x 3 reps  retro 3 plate x 3 reps Sit to stand x 15  Step over hurdles 6" x 2 RT   07/22/22 Wall arch x 10  Hurdles 8" round trip x 2 Tandem stance 30" with RT in front then Lt x 2 set Sit to stand no UE assist x 10 Side step/forward step/retro 3 plate x 4  Vector stance x 3 5" hold  two finger  Functional squat  x 10 Reciprocal stairs x 2 RT  Hip and thoracic excursions x 3 each.    07/17/22 Nustep seat 11 arms 8 level 5 x 5'  dynamic warm up Seated hamstring stretch x 30" x 3 Gastrocnemius slant board stretch x 30" x 3 Heel/toe raises x 20 x 2 lbs Tandem stance on foam, eyes open x 30" x 3 on each Stair negotiation 7" steps, 7 steps x 4 rounds, CGA-SBA, 1 UE support Forward stepping over 4" hurdles, alt pattern x 5 rounds inside // bars, no UE assist Side stepping with  blue TB on ankles, 3 rounds inside // bars, no UE assist Forward and retro walks, 20 lbs (2 plates) x 4 rounds  07/15/22 Nustep seat 11 arms 8 level 5 x 5' dynamic warm up Seated hamstring stretch x 30" x 3 Gastrocnemius slant board stretch x 30" x 3 Heel/toe raises x 20 Tandem stance on foam, eyes open x 30" x 3 on each Stair negotiation 4" steps, 7 steps x 4 rounds, CGA, no UE support Forward stepping over 4" hurdles, alt pattern x 5 rounds inside // bars, no UE assist Side stepping with GTB on ankles, 3 rounds inside // bars, no UE assist Forward and retro walks, 20 lbs (2 plates) x 4 rounds  07/09/22 Nustep seat 11 arms 8 level 5 x 5' dynamic warm up Seated hamstring stretch x 30" x 3 Gastrocnemius slant board stretch x 30" x 3 Heel/toe raises x 20 Tandem stance on foam, eyes open x 30" x 3 on each Stair negotiation 4" steps, 7 steps x 4 rounds, CGA, no UE support Forward stepping over 4" hurdles, alt pattern x 5 rounds inside // bars, no UE assist Side stepping with GTB on ankles, 3 rounds inside // bars, no UE assist Forward and retro walks, 20 lbs (2  plates) x 4 rounds   07/03/22: Heel raise x 10 Rockerboard x 2 minutes for both RT/LT and anterior/posterior Steps 4" x 2 RT Tandem stance both x 3 x each Squat x 10  Sit to stand x 10 Side stepping with green theraband x 2 rt      PATIENT EDUCATION: updated HEP  Education details: Patient educated on exam findings, POC, scope of PT, HEP, and general exercise. Person educated: Patient Education method: Explanation, Demonstration, and Handouts Education comprehension:  verbalized understanding, returned demonstration, verbal cues required, and tactile cues required  HOME EXERCISE PROGRAM: Access Code: [email or web address] URL: [email or web address]            08/05/22:thoracic excursions Date: 06/12/2022 - Seated Hamstring Stretch  - 2 x daily - 7 x weekly - 3 reps - 30 hold  06/10/22 tandem stance  Access Code:  URL: [email or web address] 06/[email or web address]/2022  Side Stepping with Counter Support  - 1 x daily - 7 x weekly - 2 sets - 10 reps - Heel Raises with Counter Support  - 1 x daily - 7 x weekly - 2 sets - 10 reps - Mini Squat with Counter Support  - 1 x daily - 7 x weekly - 2 sets - 10 rep  Date: 06/02/2022 Prepared by: Virgina Organ  Exercises - Supine Transversus Abdominis Bracing - Hands on Stomach  - 2 x daily - 7 x weekly - 1 sets - 10 reps - 5" hold - Beginner Bridge  - 2 x daily - 7 x weekly - 1 sets - 10 reps - 5" hold - Hooklying Clamshell with Resistance  - 2 x daily - 7 x weekly - 1 sets - 10 reps - 5" hold - Supine Single Knee to Chest Stretch  - 1 x daily - 7 x weekly - 1 sets - 3 reps - 30" hold - Seated Upright Posture Correction  - 1 x daily - 7 x weekly - 1 sets - 3 reps - 5" hold Date: 05/28/2022 - Seated Flexion Stretch  - 2-3 x daily - 7 x weekly - 10 reps - 5 second hold - Standing Hip Abduction with Counter Support  - 2-3 x daily - 7 x weekly - 2 sets - 10 reps - Standing Hip Extension with Counter Support  - 2-3 x daily - 7 x weekly - 3 sets - 10 reps  ASSESSMENT:  CLINICAL IMPRESSION: Pt is I in his HEP and is going to the [facility].  Pt bower, endurance, mobility and walking have all improved. Pt will be discharged to a HEP at this time.  OBJECTIVE IMPAIRMENTS: Abnormal gait, decreased activity tolerance, decreased balance, decreased endurance, decreased mobility, difficulty walking, decreased ROM, decreased strength, improper body mechanics, postural dysfunction, and pain.   ACTIVITY LIMITATIONS: carrying, lifting,  bending, standing, squatting, stairs, transfers, locomotion level, and caring for others  PARTICIPATION LIMITATIONS: meal prep, cleaning, laundry, shopping, community activity, and yard work  PERSONAL FACTORS: Age, Time since onset of injury/illness/exacerbation, and 3+ comorbidities: CAD, HTN, HLD, Hx MI, lumbar spondylosis  are also affecting patient's functional outcome.   REHAB POTENTIAL: Good  CLINICAL DECISION MAKING: Stable/uncomplicated  EVALUATION COMPLEXITY: Low   GOALS: Goals reviewed with patient? Yes  SHORT TERM GOALS: Target date: 06/18/2022    Patient will be independent with HEP in order to improve functional outcomes. Baseline:  Goal status: MET  2.  Patient will report at least 25% improvement in symptoms for improved quality of life. Baseline:  Goal status: MET  LONG TERM GOALS: Target Date: 07/09/2022    Patient will report at least 75% improvement in symptoms for improved quality of life. Baseline:  Goal status: IN PROGRESS  2.  Patient will be able to complete 5x STS in under 11.4 seconds in order to reduce the risk of falls. Baseline: 16.73 seconds without UE support 07/01/22 11.18  seconds without UE support Goal status: MET  3.  Patient will demonstrate at least 25% improvement in lumbar ROM in all restricted planes for improved ability to move trunk while completing chores. Baseline: see above Goal status: MET  4.  Patient will be able to ambulate at least 400 feet in in order to demonstrate improved tolerance to activity. Baseline: 275 feet 07/01/22 410 feet Goal status: MET  5.  Patient will demonstrate grade of 5/5 MMT grade in all tested musculature as evidence of improved strength to assist with stair ambulation and gait.   Baseline: see above  Goal status: IN PROGRESS   PLAN:  PT FREQUENCY: 2x/week  PT DURATION: 6 weeks additional 4 weeks   PLANNED INTERVENTIONS: Therapeutic exercises, Therapeutic activity, Neuromuscular  re-education, Balance training, Gait training, Patient/Family education, Joint manipulation, Joint mobilization, Stair training, Orthotic/Fit training, DME instructions, Aquatic Therapy, Dry Needling, Electrical stimulation, Spinal manipulation, Spinal mobilization, Cryotherapy, Moist heat, Compression bandaging, scar mobilization, Splintting, Taping, Traction, Ultrasound, Ionotophoresis 4mg /ml Dexamethasone, and Manual therapy  PLAN FOR NEXT SESSION: Pt ready for discharge.   Virgina Organ, PT CLT [phone]

## 2022-08-11 ENCOUNTER — Encounter (HOSPITAL_COMMUNITY): Payer: Medicare HMO | Admitting: Physical Therapy

## 2022-08-13 ENCOUNTER — Other Ambulatory Visit: Payer: Self-pay | Admitting: Cardiovascular Disease

## 2022-08-18 ENCOUNTER — Other Ambulatory Visit: Payer: Self-pay | Admitting: Cardiovascular Disease

## 2022-08-29 ENCOUNTER — Other Ambulatory Visit: Payer: Self-pay | Admitting: Cardiovascular Disease

## 2022-08-31 NOTE — Progress Notes (Signed)
Cardiology Clinic Note   Date: 09/03/2022 ID: Arbor, [place] December 19, 1940, MRN [id number]  Primary Cardiologist:  Nanetta Batty, MD  Patient Profile    Jason Johnson is a 82 y.o. male who presents to the clinic today for 1 year follow-up.  Past medical history significant for: CAD. CABG performed at [facility] in [place], [place] [address]. LHC 10/25/2007 (STEMI): PCI of 100% proximal LAD occluded SVG to distal RCA with 5 overlapping stents with graft blood flow but poor distal perfusion possibly due to nonreflow phenomenon. Myocardial perfusion study 04/09/2010: Moderate perfusion defect due to infarct/scar with moderate peri-infarct ischemia seen in the basal inferoseptal, basal inferior, mid inferoseptal, mid inferior and basal inferolateral regions.  High risk study. Carotid artery disease. Carotid ultrasound 06/11/2021: Right ICA 60 to 79%.  Left ICA 40 to 59%. Claudication.  Lower extremity 09/09/2021: No evidence of significant lower extremity arterial disease bilaterally. Irregular heartbeat. 14-day ZIO 06/01/2019: SR/SB/ST.  Occasional PVCs and PACs.  Short runs of SVT and NSVT. Hypertension. Hyperlipidemia. 01/25/2023: LDL 70, HDL 57, TG 79, total 142.   History of Present Illness    Jason Johnson is a longtime patient of cardiology.  He is followed by Dr. Allyson Sabal for the above outlined history.  Patient was last seen in the office by Dr. Allyson Sabal on 08/29/2021 for follow-up.  He was doing well at that time and no changes were made.  Today, patient is here alone.  He is doing well. Patient denies shortness of breath or dyspnea on exertion. No chest pain, pressure, or tightness.  Occasional angina with very heavy exertion that resolves quickly with rest.  He took 1 dose of as needed NTG approximately 2 to 3 months ago, as he felt angina was not resolving as quickly.  He has not needed NTG since.  Denies lower extremity edema, orthopnea, or PND. No palpitations.  He  continues to have some claudication.  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  MRI lumbar and cervical spine January 2024 showed multilevel degenerative changes without canal stenosis and variable degree of foraminal narrowing.  He was referred to physical therapy and worked with them twice a week for several weeks with improved muscle strength.  Since then he has increased his walking.  [facility], walks on the treadmill, and walks up and down the stairs.  He tries to go daily.  He also wears lower extremity compression socks which she reports has helped him tremendously.    ROS: All other systems reviewed and are otherwise negative except as noted in History of Present Illness.  Studies Reviewed    ECG personally reviewed by me today: Bradycardia with PACs, prolonged QT.  No significant changes from 08/29/2021.          Physical Exam    VS:  BP 130/68 (BP Location: Left Arm, Patient Position: Sitting, Cuff Size: Normal)   Pulse (!) 58   Ht 5\' 11"  (1.803 m)   Wt 211 lb 9.6 oz (96 kg)   SpO2 97%   BMI 29.51 kg/m  , BMI Body mass index is 29.51 kg/m.  GEN: Well nourished, well developed, in no acute distress. Neck: No JVD or carotid bruits. Cardiac:  RRR. No murmurs. No rubs or gallops.   Respiratory:  Respirations regular and unlabored. Clear to auscultation without rales, wheezing or  rhonchi. GI: Soft, nontender, nondistended. Extremities: Radials/DP/PT 2+ and equal bilaterally. No clubbing or cyanosis. No edema.  Skin: Warm and dry, no rash. Neuro: Strength intact.  Assessment & Plan    CAD.  CABG performed in [place], [place] 1999.  PCI with 5 stents to SVG to RCA July 2009.  Stress test January 2012 showed an area of inferior ischemia involving inferior septum towards the base with  some inferior scar and mild peri-infarct ischemia.  Patient chest pain, tightness, pressure.  He has brief anginal symptoms with very heavy exertion that resolves quickly with rest.  He did take as needed NTG 2 to 3 months ago secondary to angina not resolving as quickly as normal.  He has not needed any since.  [facility] is [facility] [employer] [facility] to use [facility] elliptical and walk on the treadmill as well as walk up and down the stairs several times while he is there.  Continue aspirin, Plavix, carvedilol, simvastatin, as needed SL NTG. Carotid artery disease.  Carotid ultrasound March 2023 showed right ICA 60 to 79%, left ICA 40 to 59%.  Patient denies lightheadedness, dizziness, presyncope, syncope.  Continue simvastatin, aspirin, Plavix.  Will get repeat carotid ultrasound for surveillance. Irregular heartbeat.  14-day ZIO February 2021 showed occasional PVCs and PACs with short runs of SVT and NSVT.  Patient denies palpitations.  EKG today shows sinus bradycardia with PACs.  Continue carvedilol. Hypertension. BP today 130/68.  Patient denies headaches, dizziness or vision changes. Continue carvedilol. Hyperlipidemia.  LDL May 2024 70, at goal.  Continue simvastatin.  Disposition: Carotid ultrasound.  Return in 1 year or sooner as needed.         Signed, Etta Grandchild. Suhey Radford, DNP, NP-C

## 2022-09-03 ENCOUNTER — Ambulatory Visit: Payer: Medicare HMO | Attending: Student | Admitting: Student

## 2022-09-03 ENCOUNTER — Encounter: Payer: Self-pay | Admitting: Student

## 2022-09-03 VITALS — BP 130/68 | HR 58 | Ht 71.0 in | Wt 211.6 lb

## 2022-09-03 DIAGNOSIS — I10 Essential (primary) hypertension: Secondary | ICD-10-CM | POA: Diagnosis not present

## 2022-09-03 DIAGNOSIS — I6523 Occlusion and stenosis of bilateral carotid arteries: Secondary | ICD-10-CM

## 2022-09-03 DIAGNOSIS — I499 Cardiac arrhythmia, unspecified: Secondary | ICD-10-CM

## 2022-09-03 DIAGNOSIS — E785 Hyperlipidemia, unspecified: Secondary | ICD-10-CM

## 2022-09-03 DIAGNOSIS — I2510 Atherosclerotic heart disease of native coronary artery without angina pectoris: Secondary | ICD-10-CM

## 2022-09-03 NOTE — Patient Instructions (Signed)
Medication Instructions:  Your physician recommends that you continue on your current medications as directed. Please refer to the Current Medication list given to you today.  *If you need a refill on your cardiac medications before your next appointment, please call your pharmacy*   Lab Work: NONE If you have labs (blood work) drawn today and your tests are completely normal, you will receive your results only by: MyChart Message (if you have MyChart) OR A paper copy in the mail If you have any lab test that is abnormal or we need to change your treatment, we will call you to review the results.   Testing/Procedures: Your physician has requested that you have a carotid duplex. This test is an ultrasound of the carotid arteries in your neck. It looks at blood flow through these arteries that supply the brain with blood. Allow one hour for this exam. There are no restrictions or special instructions.    Follow-Up: At [facility], you and your health needs are our priority.  As part of our continuing mission to provide you with exceptional heart care, we have created designated Provider Care Teams.  These Care Teams include your primary Cardiologist (physician) and Advanced Practice Providers (APPs -  Physician Assistants and Nurse Practitioners) who all work together to provide you with the care you need, when you need it.  We recommend signing up for the patient portal called "MyChart".  Sign up information is provided on this After Visit Summary.  MyChart is used to connect with patients for Virtual Visits (Telemedicine).  Patients are able to view lab/test results, encounter notes, upcoming appointments, etc.  Non-urgent messages can be sent to your provider as well.   To learn more about what you can do with MyChart, go to [email or web address].    Your next appointment:   1 year(s)  Provider:   Nanetta Batty, MD

## 2022-09-12 ENCOUNTER — Other Ambulatory Visit: Payer: Self-pay | Admitting: Cardiovascular Disease

## 2022-09-15 ENCOUNTER — Other Ambulatory Visit: Payer: Self-pay

## 2022-09-15 MED ORDER — SIMVASTATIN 40 MG PO TABS: 40.0000 mg | ORAL_TABLET | Freq: Every day | ORAL | 3 refills | Status: DC

## 2022-09-16 ENCOUNTER — Ambulatory Visit (HOSPITAL_COMMUNITY)
Admission: RE | Admit: 2022-09-16 | Discharge: 2022-09-16 | Disposition: A | Discharge status: Home / Self Care | Payer: Medicare HMO | Source: Ambulatory Visit | Attending: Internal Medicine | Admitting: Internal Medicine

## 2022-09-16 DIAGNOSIS — I6523 Occlusion and stenosis of bilateral carotid arteries: Secondary | ICD-10-CM | POA: Diagnosis not present

## 2022-10-21 ENCOUNTER — Other Ambulatory Visit: Payer: Self-pay | Admitting: Cardiovascular Disease

## 2022-12-04 ENCOUNTER — Encounter: Payer: Self-pay | Admitting: Urology

## 2022-12-04 ENCOUNTER — Ambulatory Visit: Payer: Medicare HMO | Admitting: Urology

## 2022-12-04 VITALS — BP 155/77 | HR 66 | Ht 71.5 in | Wt 211.6 lb

## 2022-12-04 DIAGNOSIS — R972 Elevated prostate specific antigen [PSA]: Secondary | ICD-10-CM | POA: Diagnosis not present

## 2022-12-04 DIAGNOSIS — N5201 Erectile dysfunction due to arterial insufficiency: Secondary | ICD-10-CM | POA: Diagnosis not present

## 2022-12-04 LAB — MICROSCOPIC EXAMINATION: Bacteria, UA: NONE SEEN

## 2022-12-04 LAB — URINALYSIS, ROUTINE W REFLEX MICROSCOPIC
Bilirubin, UA: NEGATIVE
Nitrite, UA: NEGATIVE
RBC, UA: NEGATIVE
Specific Gravity, UA: 1.02 (ref 1.005–1.030)
Urobilinogen, Ur: 4 mg/dL — ABNORMAL HIGH (ref 0.2–1.0)
pH, UA: 6 (ref 5.0–7.5)

## 2022-12-04 MED ORDER — TADALAFIL 5 MG PO TABS: 5.0000 mg | ORAL_TABLET | Freq: Every day | ORAL | 11 refills | Status: AC | PRN

## 2022-12-04 NOTE — Progress Notes (Signed)
Subjective: 1. Elevated PSA   2. Erectile dysfunction due to arterial insufficiency      Consult requested by Dr. Marlowe Kays is a 82 yo male who is sent for a PSA of 6.215 in 5/24.  I was 4.8 in 4/23, 3.9 in 12/21 and 9/21 and 3.10 in 6/17.  It was 2.35 in 8/12 and 1.22 in 6/5.  He has 2 half brothers who died with prostate cancer in their 1's.   He has nocturia x 0-1 but his IPSS is 0-1.  He has some issues with ED.  He is on plavix for cardiac stents placed in 2008 and a CABG in the 90's.    His UA is clear.   IPSS     Row Name 12/04/22 0900         International Prostate Symptom Score   How often have you had the sensation of not emptying your bladder? Not at All     How often have you had to urinate less than every two hours? Not at All     How often have you found you stopped and started again several times when you urinated? Not at All     How often have you found it difficult to postpone urination? Not at All     How often have you had a weak urinary stream? Not at All     How often have you had to strain to start urination? Not at All     How many times did you typically get up at night to urinate? 1 Time     Total IPSS Score 1       Quality of Life due to urinary symptoms   If you were to spend the rest of your life with your urinary condition just the way it is now how would you feel about that? Mostly Satisfied              ROS:  Review of Systems  Musculoskeletal:  Positive for back pain.  Skin:  Positive for itching.  Endo/Heme/Allergies:  Bruises/bleeds easily.  All other systems reviewed and are negative.   Allergies  Allergen Reactions   Levaquin [Levofloxacin] Itching and Rash    Past Medical History:  Diagnosis Date   Carotid artery disease (HCC)    Coronary artery disease    history of coronary artery bypass grafting in 1999   Gout    Hyperlipidemia    Hypertension    Myocardial infarction [facility]) 2009    Past Surgical History:   Procedure Laterality Date   CARDIAC CATHETERIZATION  2009   CATARACT EXTRACTION W/PHACO Right 09/09/2012   Procedure: CATARACT EXTRACTION PHACO AND INTRAOCULAR LENS PLACEMENT (IOC);  Surgeon: Gemma Payor, MD;  Location: [facility];  Service: Ophthalmology;  Laterality: Right;  CDE 14.48   CATARACT EXTRACTION W/PHACO Left 10/07/2012   Procedure: CATARACT EXTRACTION PHACO AND INTRAOCULAR LENS PLACEMENT (IOC);  Surgeon: Gemma Payor, MD;  Location: [facility];  Service: Ophthalmology;  Laterality: Left;  CDE:12.24   COLONOSCOPY     COLONOSCOPY N/A 02/11/2017   Procedure: COLONOSCOPY;  Surgeon: Corbin Ade, MD;  Location: [facility];  Service: Endoscopy;  Laterality: N/A;  7:30am   CORONARY ARTERY BYPASS GRAFT  2004   triple   DOPPLER ECHOCARDIOGRAPHY  2010   NM MYOVIEW LTD  2012   TONSILLECTOMY     as a child    Social History   Socioeconomic History   Marital status: Married  Spouse name: Not on file   Number of children: Not on file   Years of education: Not on file   Highest education level: Not on file  Occupational History   Occupation: retired Therapist, nutritional.  Tobacco Use   Smoking status: Never   Smokeless tobacco: Never  Vaping Use   Vaping status: Never Used  Substance and Sexual Activity   Alcohol use: Yes    Comment: Occasionally   Drug use: No   Sexual activity: Not on file  Other Topics Concern   Not on file  Social History Narrative   Not on file   Social Determinants of Health   Financial Resource Strain: Not on file  Food Insecurity: Not on file  Transportation Needs: Not on file  Physical Activity: Not on file  Stress: Not on file  Social Connections: Not on file  Intimate Partner Violence: Not on file    Family History  Problem Relation Age of Onset   Colon cancer Neg Hx     Anti-infectives: Anti-infectives (From admission, onward)    None       Current Outpatient Medications  Medication Sig Dispense Refill   aspirin EC 81 MG tablet  Take 1 tablet (81 mg total) by mouth daily. 90 tablet 3   carvedilol (COREG) 12.5 MG tablet TAKE 1 TABLET (12.5MG  TOTAL) BY MOUTH TWICE A DAY WITH MEALS 180 tablet 3   Cholecalciferol (VITAMIN D3 PO) Take 1,000 Units by mouth daily.     clopidogrel (PLAVIX) 75 MG tablet TAKE 1 TABLET BY MOUTH EVERY DAY 90 tablet 3   Coenzyme Q10 (CO Q 10 PO) Take by mouth daily.     Cyanocobalamin (VITAMIN B 12 PO) Take 200 mg by mouth daily.     ibuprofen (ADVIL,MOTRIN) 200 MG tablet Take 400-600 mg by mouth daily as needed for moderate pain.     nitroGLYCERIN (NITROSTAT) 0.4 MG SL tablet Place 1 tablet (0.4 mg total) under the tongue every 5 (five) minutes as needed for chest pain. 25 tablet 4   simvastatin (ZOCOR) 40 MG tablet Take 1 tablet (40 mg total) by mouth at bedtime. 90 tablet 3   tadalafil (CIALIS) 5 MG tablet Take 1 tablet (5 mg total) by mouth daily as needed for erectile dysfunction. Take 2 tabs in combination with the sildenafil as instructed. 30 tablet 11   No current facility-administered medications for this visit.     Objective: Vital signs in last 24 hours: BP (!) 155/77   Pulse 66   Ht 5' 11.5" (1.816 m)   Wt 211 lb 9.6 oz (96 kg)   BMI 29.10 kg/m   Intake/Output from previous day: No intake/output data recorded. Intake/Output this shift: @IOTHISSHIFT @   Physical Exam Vitals reviewed.  Constitutional:      Appearance: Normal appearance. He is obese.  Cardiovascular:     Rate and Rhythm: Normal rate and regular rhythm.     Heart sounds: Normal heart sounds.  Pulmonary:     Effort: Pulmonary effort is normal. No respiratory distress.     Breath sounds: Normal breath sounds.  Abdominal:     General: Abdomen is flat.     Palpations: Abdomen is soft.     Tenderness: There is no abdominal tenderness.     Hernia: No hernia is present.  Genitourinary:    Comments: Nl phallus with an adequate meatus. Scrotum, testes and epididymis normal. AP NST without mass. Prostate is  1+ and smooth. SV non-palpable.  Musculoskeletal:  General: No swelling or tenderness. Normal range of motion.  Skin:    General: Skin is warm and dry.  Neurological:     General: No focal deficit present.     Mental Status: He is alert and oriented to person, place, and time.  Psychiatric:        Mood and Affect: Mood normal.        Behavior: Behavior normal.     Lab Results:  No results found for this or any previous visit (from the past 24 hour(s)).  BMET No results for input(s): "NA", "K", "CL", "CO2", "GLUCOSE", "BUN", "CREATININE", "CALCIUM" in the last 72 hours. PT/INR No results for input(s): "LABPROT", "INR" in the last 72 hours. ABG No results for input(s): "PHART", "HCO3" in the last 72 hours.  Invalid input(s): "PCO2", "PO2" UA reviewed.  Studies/Results: No results found.   Assessment/Plan: Elevated PSA with a small prostate.  He has a strong family history of prostate cancer.   I will repeat a PSA today and will get him set up for a prostate MRI.  He would like the MRI before considering a biopsy.  I reviewed the risk of a biopsy including bleeding, infection and voiding difficulty.   If the PSA is down and the MRI is negative, I will just have him return in 3 months with a PSA.   Erectile dysfunction. He has a partial response to sildenafil so I will give him tadalafil to use in low dose combination with 10mg  tadalafil and 40-60mg  of sildenafil.   Meds ordered this encounter  Medications   tadalafil (CIALIS) 5 MG tablet    Sig: Take 1 tablet (5 mg total) by mouth daily as needed for erectile dysfunction. Take 2 tabs in combination with the sildenafil as instructed.    Dispense:  30 tablet    Refill:  11     Orders Placed This Encounter  Procedures   Microscopic Examination   MR PROSTATE W WO CONTRAST    Standing Status:   Future    Standing Expiration Date:   03/06/2023    Order Specific Question:   If indicated for the ordered procedure, I  authorize the administration of contrast media per Radiology protocol    Answer:   Yes    Order Specific Question:   What is the patient's sedation requirement?    Answer:   No Sedation    Order Specific Question:   Does the patient have a pacemaker or implanted devices?    Answer:   No    Order Specific Question:   Radiology Contrast Protocol - do NOT remove file path    Answer:   [email or web address]    Order Specific Question:   Preferred imaging location?    Answer:   [facility] (table limit-550lbs)   Urinalysis, Routine w reflex microscopic   PSA, total and free   PSA, total and free    Standing Status:   Future    Standing Expiration Date:   06/05/2023     Return in about 3 months (around 03/06/2023) for with PSA.    CC: Dr. Elfredia Nevins.      Bjorn Pippin 12/05/2022

## 2022-12-11 ENCOUNTER — Other Ambulatory Visit: Payer: Medicare HMO

## 2022-12-11 DIAGNOSIS — R972 Elevated prostate specific antigen [PSA]: Secondary | ICD-10-CM

## 2022-12-12 LAB — PSA, TOTAL AND FREE
PSA, Free Pct: 15 %
PSA, Free: 0.81 ng/mL
Prostate Specific Ag, Serum: 5.4 ng/mL — ABNORMAL HIGH (ref 0.0–4.0)

## 2023-03-12 ENCOUNTER — Ambulatory Visit: Payer: Medicare HMO | Admitting: Urology

## 2023-07-08 ENCOUNTER — Encounter: Payer: Self-pay | Admitting: Urology

## 2023-07-08 ENCOUNTER — Telehealth: Payer: Self-pay | Admitting: Urology

## 2023-07-08 NOTE — Telephone Encounter (Signed)
 FYI and Per last OV note just a PSA is needed please advise

## 2023-07-08 NOTE — Telephone Encounter (Signed)
 Jason Johnson he is suppose to have MRI but cannot schedule because there is no order

## 2023-07-09 ENCOUNTER — Other Ambulatory Visit: Payer: Self-pay | Admitting: Urology

## 2023-07-09 DIAGNOSIS — R972 Elevated prostate specific antigen [PSA]: Secondary | ICD-10-CM

## 2023-07-09 NOTE — Telephone Encounter (Signed)
 Came into the office and has prior auth approving MRI from insurance company good until May.

## 2023-07-09 NOTE — Telephone Encounter (Signed)
 FYI

## 2023-07-10 NOTE — Telephone Encounter (Signed)
 Called Pt to relay message from MD Annabell Howells, "Order placed for the scan.  HE will need f/u with any available MD with the results and should have a repeat PSA prior to the visit." Pt advised Pt states he will call centralized scheduling to have MRI scheduled then he will c/b our office to schedule needed f/u

## 2023-07-13 ENCOUNTER — Telehealth: Payer: Self-pay | Admitting: Urology

## 2023-07-13 NOTE — Telephone Encounter (Signed)
 Jason Johnson does do the MRI that's where [facility] sends his Pt however after that MRI if a fusion biopsy is needed that is completed in [employer]

## 2023-07-13 NOTE — Telephone Encounter (Signed)
 Says MRI was ordered for [facility] and he wants it at [facility]. Can order be changed?

## 2023-07-13 NOTE — Telephone Encounter (Signed)
 FYI. MRI order already updated per Pt request

## 2023-07-14 NOTE — Telephone Encounter (Signed)
 Patient stopped in office to make sure order is now for [facility]

## 2023-07-14 NOTE — Telephone Encounter (Signed)
 Called Pt to let him know the MRI order was changed to [facility]

## 2023-07-21 ENCOUNTER — Ambulatory Visit (HOSPITAL_COMMUNITY)
Admission: RE | Admit: 2023-07-21 | Discharge: 2023-07-21 | Disposition: A | Discharge status: Home / Self Care | Source: Ambulatory Visit | Attending: Urology | Admitting: Urology

## 2023-07-21 DIAGNOSIS — R972 Elevated prostate specific antigen [PSA]: Secondary | ICD-10-CM | POA: Insufficient documentation

## 2023-07-21 MED ORDER — GADOBUTROL 1 MMOL/ML IV SOLN
10.0000 mL | Freq: Once | INTRAVENOUS | Status: AC | PRN
  Administered 2023-07-21: 10 mL via INTRAVENOUS

## 2023-07-22 ENCOUNTER — Other Ambulatory Visit: Payer: Self-pay | Admitting: Urology

## 2023-07-22 DIAGNOSIS — R972 Elevated prostate specific antigen [PSA]: Secondary | ICD-10-CM

## 2023-07-24 ENCOUNTER — Telehealth: Payer: Self-pay

## 2023-07-24 NOTE — Telephone Encounter (Signed)
 Called pt to schedule needed f/u per MD wrenn see previous telephone encounters

## 2023-07-24 NOTE — Telephone Encounter (Signed)
-----   Message from Homero Luster sent at 07/24/2023 10:13 AM EDT ----- yes ----- Message ----- From: Dyke Glasser, CMA Sent: 07/22/2023   3:09 PM EDT To: Homero Luster, MD  Do you want him to be seen for UA and to update his history before the US  and biopsy ? ----- Message ----- From: Homero Luster, MD Sent: 07/22/2023   1:22 PM EDT To: Dyke Glasser, CMA  He hasn't been in since 8/24.   He is going to need a prostate US  and biopsy but will need to be seen for a UA check and to up date his history.   I will see about getting him scheduled for an MRI fusion biopsy.   He could be double booked with me in the next few weeks to discuss the biopsy and I can't get him in maybe he could see Dr. Zachery Hermes or Dr. Baker Bon. ----- Message ----- From: Dyke Glasser, CMA Sent: 07/22/2023   8:50 AM EDT To: Homero Luster, MD  Please review. No f/u on file

## 2023-08-06 ENCOUNTER — Ambulatory Visit: Admitting: Urology

## 2023-08-06 ENCOUNTER — Telehealth (HOSPITAL_BASED_OUTPATIENT_CLINIC_OR_DEPARTMENT_OTHER): Payer: Self-pay | Admitting: *Deleted

## 2023-08-06 ENCOUNTER — Encounter: Payer: Self-pay | Admitting: Urology

## 2023-08-06 VITALS — BP 151/64 | HR 55

## 2023-08-06 DIAGNOSIS — R9349 Abnormal radiologic findings on diagnostic imaging of other urinary organs: Secondary | ICD-10-CM

## 2023-08-06 DIAGNOSIS — R972 Elevated prostate specific antigen [PSA]: Secondary | ICD-10-CM

## 2023-08-06 LAB — URINALYSIS, ROUTINE W REFLEX MICROSCOPIC
Bilirubin, UA: NEGATIVE
Glucose, UA: NEGATIVE
Ketones, UA: NEGATIVE
Leukocytes,UA: NEGATIVE
Nitrite, UA: NEGATIVE
RBC, UA: NEGATIVE
Specific Gravity, UA: 1.02 (ref 1.005–1.030)
Urobilinogen, Ur: 4 mg/dL — ABNORMAL HIGH (ref 0.2–1.0)
pH, UA: 6 (ref 5.0–7.5)

## 2023-08-06 MED ORDER — SULFAMETHOXAZOLE-TRIMETHOPRIM 800-160 MG PO TABS: ORAL_TABLET | ORAL | 0 refills | Status: AC

## 2023-08-06 NOTE — Telephone Encounter (Signed)
   Pre-operative Risk Assessment    Patient Name: Jason Johnson  DOB: 24-Sep-1940 MRN: [id number]   Date of last office visit: 09/03/22 Date of next office visit: None  Request for Surgical Clearance    Procedure:  Fusion Biopsy  Date of Surgery:  Clearance TBD                                 Surgeon:  Dr. Homero Luster Surgeon's Group or Practice Name:  [facility] [place] Phone number:  [phone] Fax number:  [phone]   Type of Clearance Requested:   - Medical  - Pharmacy:  Hold Aspirin  and Clopidogrel  (Plavix ) 7 days prior   Type of Anesthesia:  Local    Additional requests/questions:    Signed, Lauris Port   08/06/2023, 11:39 AM

## 2023-08-06 NOTE — Progress Notes (Signed)
 Subjective: 1. Elevated PSA   2. Abnormal findings on diagnostic imaging of urinary organs      Consult requested by Dr. Kathyleen Parkins  08/06/23: Jason Johnson returns today in f/u.  His PSA on 12/11/22 was 5.4 with a 15% f/t ratio.  He had an MRIP on 07/22/23 and was found to have a 31ml prostate with a P5 lesion in the transitional zone on the left in the mid prostate.  His IPSS is 0.     12/04/22: Jason Johnson is a 83 yo male who is sent for a PSA of 6.215 in 5/24.  I was 4.8 in 4/23, 3.9 in 12/21 and 9/21 and 3.10 in 6/17.  It was 2.35 in 8/12 and 1.22 in 6/5.  He has 2 half brothers who died with prostate cancer in their 2's.   He has nocturia x 0-1 but his IPSS is 0-1.  He has some issues with ED.  He is on plavix  for cardiac stents placed in 2008 and a CABG in the 90's.    His UA is clear.     ROS:  Review of Systems  Musculoskeletal:  Positive for back pain.  Skin:  Positive for itching.  Endo/Heme/Allergies:  Bruises/bleeds easily.  All other systems reviewed and are negative.   Allergies  Allergen Reactions   Levaquin [Levofloxacin] Itching and Rash    Past Medical History:  Diagnosis Date   Carotid artery disease (HCC)    Coronary artery disease    history of coronary artery bypass grafting in 1999   Gout    Hyperlipidemia    Hypertension    Myocardial infarction [facility]) 2009    Past Surgical History:  Procedure Laterality Date   CARDIAC CATHETERIZATION  2009   CATARACT EXTRACTION W/PHACO Right 09/09/2012   Procedure: CATARACT EXTRACTION PHACO AND INTRAOCULAR LENS PLACEMENT (IOC);  Surgeon: Anner Kill, MD;  Location: [facility];  Service: Ophthalmology;  Laterality: Right;  CDE 14.48   CATARACT EXTRACTION W/PHACO Left 10/07/2012   Procedure: CATARACT EXTRACTION PHACO AND INTRAOCULAR LENS PLACEMENT (IOC);  Surgeon: Anner Kill, MD;  Location: [facility];  Service: Ophthalmology;  Laterality: Left;  CDE:12.24   COLONOSCOPY     COLONOSCOPY N/A 02/11/2017   Procedure: COLONOSCOPY;  Surgeon:  Suzette Espy, MD;  Location: [facility];  Service: Endoscopy;  Laterality: N/A;  7:30am   CORONARY ARTERY BYPASS GRAFT  2004   triple   DOPPLER ECHOCARDIOGRAPHY  2010   NM MYOVIEW  LTD  2012   TONSILLECTOMY     as a child    Social History   Socioeconomic History   Marital status: Married    Spouse name: Not on file   Number of children: Not on file   Years of education: Not on file   Highest education level: Not on file  Occupational History   Occupation: retired Therapist, nutritional.  Tobacco Use   Smoking status: Never   Smokeless tobacco: Never  Vaping Use   Vaping status: Never Used  Substance and Sexual Activity   Alcohol use: Yes    Comment: Occasionally   Drug use: No   Sexual activity: Not on file  Other Topics Concern   Not on file  Social History Narrative   Not on file   Social Drivers of Health   Financial Resource Strain: Not on file  Food Insecurity: Not on file  Transportation Needs: Not on file  Physical Activity: Not on file  Stress: Not on file  Social Connections: Not on  file  Intimate Partner Violence: Not on file    Family History  Problem Relation Age of Onset   Colon cancer Neg Hx     Anti-infectives: Anti-infectives (From admission, onward)    Start     Dose/Rate Route Frequency Ordered Stop   08/06/23 0000  sulfamethoxazole -trimethoprim  (BACTRIM  DS) 800-160 MG tablet           08/06/23 0958         Current Outpatient Medications  Medication Sig Dispense Refill   aspirin  EC 81 MG tablet Take 1 tablet (81 mg total) by mouth daily. 90 tablet 3   carvedilol  (COREG ) 12.5 MG tablet TAKE 1 TABLET (12.5MG  TOTAL) BY MOUTH TWICE A DAY WITH MEALS 180 tablet 3   Cholecalciferol (VITAMIN D3 PO) Take 1,000 Units by mouth daily.     clopidogrel  (PLAVIX ) 75 MG tablet TAKE 1 TABLET BY MOUTH EVERY DAY 90 tablet 3   Coenzyme Q10 (CO Q 10 PO) Take by mouth daily.     Cyanocobalamin (VITAMIN B 12 PO) Take 200 mg by mouth daily.     ibuprofen  (ADVIL,MOTRIN) 200 MG tablet Take 400-600 mg by mouth daily as needed for moderate pain.     nitroGLYCERIN  (NITROSTAT ) 0.4 MG SL tablet Place 1 tablet (0.4 mg total) under the tongue every 5 (five) minutes as needed for chest pain. 25 tablet 4   simvastatin  (ZOCOR ) 40 MG tablet Take 1 tablet (40 mg total) by mouth at bedtime. 90 tablet 3   sulfamethoxazole -trimethoprim  (BACTRIM  DS) 800-160 MG tablet 1 tablet po 1 hour prior to procedure. 1 tablet 0   tadalafil  (CIALIS ) 5 MG tablet Take 1 tablet (5 mg total) by mouth daily as needed for erectile dysfunction. Take 2 tabs in combination with the sildenafil as instructed. 30 tablet 11   No current facility-administered medications for this visit.     Objective: Vital signs in last 24 hours: BP (!) 151/64   Pulse (!) 55   Intake/Output from previous day: No intake/output data recorded. Intake/Output this shift: @IOTHISSHIFT @   Physical Exam Vitals reviewed.  Constitutional:      Appearance: Normal appearance.  Neurological:     Mental Status: He is alert.     Lab Results:  No results found for this or any previous visit (from the past 24 hours).  BMET No results for input(s): "NA", "K", "CL", "CO2", "GLUCOSE", "BUN", "CREATININE", "CALCIUM " in the last 72 hours. PT/INR No results for input(s): "LABPROT", "INR" in the last 72 hours. ABG No results for input(s): "PHART", "HCO3" in the last 72 hours.  Invalid input(s): "PCO2", "PO2" UA reviewed.   Lab Results  Component Value Date   PSA1 5.4 (H) 12/11/2022    Studies/Results: No results found. MR PROSTATE W WO CONTRAST Result Date: 07/22/2023 CLINICAL DATA:  Prostate cancer suspected EXAM: MR PROSTATE WITHOUT AND WITH CONTRAST TECHNIQUE: Multiplanar multisequence MRI images were obtained of the pelvis centered about the prostate. Pre and post contrast images were obtained. CONTRAST:  10mL GADAVIST  GADOBUTROL  1 MMOL/ML IV SOLN COMPARISON:  None Available. FINDINGS: Prostate:  Lesion 1: Location: Left anteromedial transition zone at the midgland (9:36). Size (measured on T2): 19 mm DWI assessment: PI-RADS 5 T2 assessment: PI-RADS 5 Postcontrast enhancement: Present Fibromuscular stroma invasion: Broadly abutting without significant thickening or extra capsular extension. Overall assessment: PI-RADS 5 Linear T2 hypointense band and wedge-shaped regions suggestive of scarring and chronic inflammation from prior prostatitis. No restricted diffusion to suggest a clinically significant peripheral zone lesion.  Enlarged transition zone with mixed glandular and stromal hyperplasia. Volume: 4.7 x 3.5 x 3.6 cm (estimated volume 31 mL). Transcapsular spread:  Absent Seminal vesicle involvement: Absent Neurovascular bundle involvement: Absent Pelvic adenopathy: Absent Bone metastasis: Absent Other findings: Sigmoid diverticulosis. IMPRESSION: 1. PI-RADS 5 transition zone lesion. 2. No pelvic lymphadenopathy or suspicious osseous lesion. Electronically Signed   By: Rox Cope M.D.   On: 07/22/2023 08:37     Assessment/Plan: Elevated PSA with a small prostate and PIRADS 5 lesion on MRIP.  I will get him set up for an MR fusion biopsy and reviewed the risks of bleeding, infection and voiding difficulty.  I will get him cleared by Dr. Katheryne Pane to come off of the plavix  and ASA.   (I got a reply that it is ok to hold plavix  and ASA per routine)  Erectile dysfunction. He didn't have a better response to the combination therapy. Meds ordered this encounter  Medications   sulfamethoxazole -trimethoprim  (BACTRIM  DS) 800-160 MG tablet    Sig: 1 tablet po 1 hour prior to procedure.    Dispense:  1 tablet    Refill:  0     Orders Placed This Encounter  Procedures   Urinalysis, Routine w reflex microscopic   Ambulatory referral to Urology    Referral Priority:   Routine    Referral Type:   Consultation    Referral Reason:   Specialty Services Required    Referred to Provider:   Homero Luster, MD    Requested Specialty:   Urology    Number of Visits Requested:   1     Return for Next available MR fusion biopsy in [facility] and I will call with the results.  .    CC: Dr. Kathyleen Parkins.      Homero Luster 08/07/2023

## 2023-08-06 NOTE — Patient Instructions (Signed)
 Prostate Biopsy Instructions:   Please arrive 15 minutes prior to your scheduled visit time to allow registration time. If you are late for your scheduled visit time, you may be asked to reschedule due to time restraints.  You will need to stop all aspirin  or blood thinners (aspirin , plavix , coumadin, warfarin, motrin, ibuprofen, advil, aleve, naproxen, naprosyn) for 7 days prior to the procedure.  If you have any questions about stopping these medications, please contact your prescribing doctor.  Our office will obtain clearance for your blood thinners to be held- please do not stop these medications until you have been given instruction to do so.  Having a light meal prior to the procedure is recommended.  If you are diabetic or have low blood sugar please bring a small snack or glucose tablet.  A Fleets enema is needed to be purchased over the counter at a local pharmacy and used 2 hours before you scheduled appointment.  This can be purchased over the counter at any pharmacy.  One antibiotic tablet has been sent to your pharmacy. Please pick this prescription up and take 2 hours prior to your scheduled biopsy appointment. You will also be given an additional antibiotic injection at the biopsy appointment.   Please bring someone with you to the procedure to drive you home if you are given a valium to take prior to your procedure.   If you have any questions or concerns, please feel free to call the office at [phone] or send a Mychart message.  What you can expect after your prostate biopsy:  You may notice blood in your urine or stool up to 1 week after your procedure. You may notice blood in your semen up to 1 month after your procedure.  If you are unable to pee afterwards or develop a fever, please call our office at [phone] and hit option 3 to speak with a clinical staff member.  Your physicain will notify you of your results.    Thank you, [facility]

## 2023-08-07 NOTE — Telephone Encounter (Signed)
 Called pt left voice mail to call the office to set an IN OFFICE appt for the Preop clearance.

## 2023-08-07 NOTE — Telephone Encounter (Signed)
   Name: MAJIK PREVOT  DOB: 03/23/1941  MRN: [id number]  Primary Cardiologist: Lauro Portal, MD  Chart reviewed as part of pre-operative protocol coverage. Because of Izaih Jaggard Simerson's past medical history and time since last visit, he will require a follow-up in-office visit in order to better assess preoperative cardiovascular risk.  Pre-op covering staff: - Please schedule appointment and call patient to inform them. If patient already had an upcoming appointment within acceptable timeframe, please add "pre-op clearance" to the appointment notes so provider is aware. - Please contact requesting surgeon's office via preferred method (i.e, phone, fax) to inform them of need for appointment prior to surgery.  He may hold Plavix  for 5 days prior to procedure.  He may hold Aspirin  5-7 days prior to procedure.  Please resume Plavix  and Aspirin  as soon as possible postprocedure, at the discretion of the surgeon.    Ava Boatman, NP  08/07/2023, 3:43 PM

## 2023-08-10 NOTE — Telephone Encounter (Signed)
 Tried calling patient to schedule in person office visit no answer left a detailed vm to call back and schedule

## 2023-08-12 ENCOUNTER — Telehealth: Payer: Self-pay | Admitting: Cardiovascular Disease

## 2023-08-12 DIAGNOSIS — I6523 Occlusion and stenosis of bilateral carotid arteries: Secondary | ICD-10-CM

## 2023-08-12 NOTE — Telephone Encounter (Signed)
 Morey Ar, NP 09/18/2022  3:08 PM EDT     Please let patient know carotid narrowing is stable from previous. Will repeat in one year.   Thank you!   DW   Order placed for carotid US  to be done in [place] office. Pt made aware.

## 2023-08-12 NOTE — Telephone Encounter (Signed)
 Pt is requesting a callback regarding him wanting to know if he'd need an order put in to have a carotid done in [place] so that he doesn't have to drive to [place] twice before his yearly appt. Please advise.

## 2023-08-12 NOTE — Telephone Encounter (Signed)
 Pt has appt 09/16/23 with Dr. Katheryne Pane. I will add preop clearance needed to appt notes.

## 2023-09-07 ENCOUNTER — Other Ambulatory Visit: Payer: Self-pay | Admitting: Student

## 2023-09-10 ENCOUNTER — Other Ambulatory Visit: Payer: Self-pay

## 2023-09-10 ENCOUNTER — Telehealth: Payer: Self-pay

## 2023-09-10 ENCOUNTER — Ambulatory Visit: Payer: Self-pay

## 2023-09-10 DIAGNOSIS — Z01818 Encounter for other preprocedural examination: Secondary | ICD-10-CM

## 2023-09-10 NOTE — Telephone Encounter (Signed)
-----   Message from Homero Luster sent at 09/10/2023  3:25 PM EDT ----- Needs to have a repeat UA closer to the biopsy date which is why I want him to come back.  5/1 is over a month ago. ----- Message ----- From: Dyke Glasser, CMA Sent: 09/10/2023  12:49 PM EDT To: Homero Luster, MD  Pt just sen 05/01 ----- Message ----- From: Homero Luster, MD Sent: 09/10/2023  11:59 AM EDT To: Dyke Glasser, CMA  He could see Sarah in the next week or so to get a UA and update his history. ----- Message ----- From: Dyke Glasser, CMA Sent: 09/10/2023  10:44 AM EDT To: Homero Luster, MD  For carnification what would be best ? ----- Message ----- From: Homero Luster, MD Sent: 09/10/2023  10:20 AM EDT To: Dyke Glasser, CMA  Since the biopsy isn't until 6/27, that would be best.  Thanks. ----- Message ----- From: Dyke Glasser, CMA Sent: 09/10/2023   8:22 AM EDT To: Homero Luster, MD  FYI results were edited

## 2023-09-10 NOTE — Telephone Encounter (Signed)
 Called and left detailed vm for pt letting them know

## 2023-09-10 NOTE — Telephone Encounter (Signed)
 Called and left detailed vm to let pt know MD wanted to do a UA and culture for before bx on 06/26 pt has mychart but advised to c/b if needed phone number to our office and my extension provided

## 2023-09-11 ENCOUNTER — Ambulatory Visit (HOSPITAL_COMMUNITY)
Admission: RE | Admit: 2023-09-11 | Discharge: 2023-09-11 | Disposition: A | Discharge status: Home / Self Care | Source: Ambulatory Visit | Attending: Student | Admitting: Student

## 2023-09-11 DIAGNOSIS — I6523 Occlusion and stenosis of bilateral carotid arteries: Secondary | ICD-10-CM | POA: Diagnosis present

## 2023-09-16 ENCOUNTER — Ambulatory Visit: Payer: Self-pay | Admitting: Student

## 2023-09-16 ENCOUNTER — Encounter: Payer: Self-pay | Admitting: Cardiovascular Disease

## 2023-09-16 ENCOUNTER — Other Ambulatory Visit

## 2023-09-16 ENCOUNTER — Ambulatory Visit: Attending: Cardiovascular Disease | Admitting: Cardiovascular Disease

## 2023-09-16 VITALS — BP 140/60 | HR 55 | Ht 71.5 in | Wt 206.0 lb

## 2023-09-16 DIAGNOSIS — E782 Mixed hyperlipidemia: Secondary | ICD-10-CM | POA: Diagnosis not present

## 2023-09-16 DIAGNOSIS — I10 Essential (primary) hypertension: Secondary | ICD-10-CM

## 2023-09-16 DIAGNOSIS — I6523 Occlusion and stenosis of bilateral carotid arteries: Secondary | ICD-10-CM | POA: Diagnosis not present

## 2023-09-16 DIAGNOSIS — I499 Cardiac arrhythmia, unspecified: Secondary | ICD-10-CM | POA: Diagnosis not present

## 2023-09-16 NOTE — Assessment & Plan Note (Signed)
 History of moderate bilateral ICA stenosis by duplex ultrasound performed 09/11/2023.  This to be repeated on an annual basis.

## 2023-09-16 NOTE — Assessment & Plan Note (Signed)
 History of occasional palpitations with Zio patch performed in 2021 that showed occasional PACs and PVCs and short runs of SVT and nonsustained VT.  Partly because she PAF no A-fib.  These palpitations really of no clinical significance.

## 2023-09-16 NOTE — Assessment & Plan Note (Signed)
 History of essential hypertension blood pressure measured today 140/60.  He is on carvedilol .

## 2023-09-16 NOTE — Assessment & Plan Note (Signed)
 History of hyperlipidemia on statin therapy with lipid profile performed 08/20/2022 revealing total cholesterol 142, LDL 70 and HDL of 57.  Will check a lipid liver profile today.

## 2023-09-16 NOTE — Progress Notes (Signed)
 09/16/2023 Jason Johnson   Mar 07, 1941  [id number]  Primary Physician Kathyleen Parkins, MD Primary Cardiologist: Avanell Leigh MD Bennye Bravo, [place]  HPI:  Jason Johnson is a 83 y.o.  mildly overweight, married Caucasian male, father to 2, (father to 3 stepchildren,) grandfather to 5 great-grandchildren who I last saw in the office 08/30/2018.  He is accompanied by his wife Jason Johnson who is also patient.  He has a history of CAD status post coronary artery bypass grafting by Dr. Mercer Stakes at the [facility] in [place] , [place] back in 1999. His risk factors include hypertension and hyperlipidemia. 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 He underwent AngioJet mechanical aspiration thrombectomy, angioplasty and stenting of that graft and has done well since. He denies chest pain or shortness of breath. He had a Myoview  performed in our office October 08, 2010, that was remarkable for a very subtle area of inferior ischemia involving inferior septum towards the base with some inferior scar with some mild peri-infarct ischemia.     He does get out and do moderate activity.  He has mild stable angina which has not changed in severity.  His PCP checks his lipid profile which most recently performed 12/24/2018 revealed a total cholesterol of 53, LDL of 83 and HDL 49.  He also has moderate bilateral ICA stenosis which we followed by duplex ultrasound on annual basis.   Since I saw him in the office 2 years ago he is remained stable.  He has seen Morey Ar, NP in the office a year ago.  He has joined the [employer] and works out ordered 5 times a week.  He says that when he stops clopidogrel  he gets some atypical chest pain.  I am not sure the significance of this.  He does have moderate bilateral ICA stenosis with which we followed by duplex ultrasound.  He is also complaining of some pain in his legs with  normal ABIs and ultimately he was found to have lumbar radiculopathy.   Current Meds  Medication Sig   aspirin  EC 81 MG tablet Take 1 tablet (81 mg total) by mouth daily.   carvedilol  (COREG ) 12.5 MG tablet TAKE 1 TABLET (12.5MG  TOTAL) BY MOUTH TWICE A DAY WITH MEALS   Cholecalciferol (VITAMIN D3 PO) Take 1,000 Units by mouth daily.   clopidogrel  (PLAVIX ) 75 MG tablet TAKE 1 TABLET BY MOUTH EVERY DAY   Coenzyme Q10 (CO Q 10 PO) Take by mouth daily.   Cyanocobalamin (VITAMIN B 12 PO) Take 200 mg by mouth daily.   ibuprofen (ADVIL,MOTRIN) 200 MG tablet Take 400-600 mg by mouth daily as needed for moderate pain.   nitroGLYCERIN  (NITROSTAT ) 0.4 MG SL tablet Place 1 tablet (0.4 mg total) under the tongue every 5 (five) minutes as needed for chest pain.   simvastatin  (ZOCOR ) 40 MG tablet TAKE 1 TABLET BY MOUTH EVERYDAY AT BEDTIME   sulfamethoxazole -trimethoprim  (BACTRIM  DS) 800-160 MG tablet 1 tablet po 1 hour prior to procedure.   tadalafil  (CIALIS ) 5 MG tablet Take 1 tablet (5 mg total) by mouth daily as needed for erectile dysfunction. Take 2 tabs in combination with the sildenafil as instructed.     Allergies  Allergen Reactions   Levaquin [Levofloxacin] Itching and Rash    Social History   Socioeconomic History   Marital status: Married    Spouse name: Not on file   Number  of children: Not on file   Years of education: Not on file   Highest education level: Not on file  Occupational History   Occupation: retired Therapist, nutritional.  Tobacco Use   Smoking status: Never   Smokeless tobacco: Never  Vaping Use   Vaping status: Never Used  Substance and Sexual Activity   Alcohol use: Yes    Comment: Occasionally   Drug use: No   Sexual activity: Not on file  Other Topics Concern   Not on file  Social History Narrative   Not on file   Social Drivers of Health   Financial Resource Strain: Not on file  Food Insecurity: Not on file  Transportation Needs: Not on file   Physical Activity: Not on file  Stress: Not on file  Social Connections: Not on file  Intimate Partner Violence: Not on file     Review of Systems: General: negative for chills, fever, night sweats or weight changes.  Cardiovascular: negative for chest pain, dyspnea on exertion, edema, orthopnea, palpitations, paroxysmal nocturnal dyspnea or shortness of breath Dermatological: negative for rash Respiratory: negative for cough or wheezing Urologic: negative for hematuria Abdominal: negative for nausea, vomiting, diarrhea, bright red blood per rectum, melena, or hematemesis Neurologic: negative for visual changes, syncope, or dizziness All other systems reviewed and are otherwise negative except as noted above.    Blood pressure (!) 140/60, pulse (!) 55, height 5' 11.5 (1.816 m), weight 206 lb (93.4 kg), SpO2 95%.  General appearance: alert and no distress Neck: no adenopathy, no JVD, supple, symmetrical, trachea midline, thyroid  not enlarged, symmetric, no tenderness/mass/nodules, and soft bilateral carotid bruits Lungs: clear to auscultation bilaterally Heart: regular rate and rhythm, S1, S2 normal, no murmur, click, rub or gallop Extremities: extremities normal, atraumatic, no cyanosis or edema Pulses: 2+ and symmetric Skin: Skin color, texture, turgor normal. No rashes or lesions Neurologic: Grossly normal  EKG EKG Interpretation Date/Time:  Wednesday September 16 2023 09:36:32 EDT Ventricular Rate:  55 PR Interval:  164 QRS Duration:  102 QT Interval:  464 QTC Calculation: 443 R Axis:   108  Text Interpretation: Sinus bradycardia with sinus arrhythmia Incomplete right bundle branch block Possible Right ventricular hypertrophy Nonspecific ST abnormality When compared with ECG of 26-Oct-2007 08:00, Significant changes have occurred Confirmed by Lauro Portal [phone]) on 09/16/2023 9:46:01 AM    ASSESSMENT AND PLAN:   Coronary artery disease History of CAD status post  coronary bypass grafting by Dr. Mercer Stakes at [facility] in 1999.  He had an inferior STEMI October 25, 2007 with occlusion of his RCA vein graft and underwent AngioJet mechanical aspiration thrombectomy, angioplasty and stenting of the graft by Dr. Ebbie Goldmann.  He has done well since.  He is active and works out at [employer] and denies chest pain or shortness of breath.  Carotid artery disease (HCC) History of moderate bilateral ICA stenosis by duplex ultrasound performed 09/11/2023.  This to be repeated on an annual basis.  Essential hypertension History of essential hypertension blood pressure measured today 140/60.  He is on carvedilol .  Hyperlipidemia History of hyperlipidemia on statin therapy with lipid profile performed 08/20/2022 revealing total cholesterol 142, LDL 70 and HDL of 57.  Will check a lipid liver profile today.  Irregular heartbeat History of occasional palpitations with Zio patch performed in 2021 that showed occasional PACs and PVCs and short runs of SVT and nonsustained VT.  Partly because she PAF no A-fib.  These palpitations really of no clinical  significance.     Avanell Leigh MD FACP,FACC,FAHA, [facility] 09/16/2023 10:04 AM

## 2023-09-16 NOTE — Patient Instructions (Addendum)
 Medication Instructions:  Your physician recommends that you continue on your current medications as directed. Please refer to the Current Medication list given to you today.  *If you need a refill on your cardiac medications before your next appointment, please call your pharmacy*  Lab Work: Your physician recommends that you have labs drawn today: Lipid/liver panel  If you have labs (blood work) drawn today and your tests are completely normal, you will receive your results only by: MyChart Message (if you have MyChart) OR A paper copy in the mail If you have any lab test that is abnormal or we need to change your treatment, we will call you to review the results.  Testing/Procedures: Your physician has requested that you have a carotid duplex. This test is an ultrasound of the carotid arteries in your neck. It looks at blood flow through these arteries that supply the brain with blood. Allow one hour for this exam. There are no restrictions or special instructions. This will take place at [address], 4th floor  **To be done in June 2026**  Please note: We ask at that you not bring children with you during ultrasound (echo/ vascular) testing. Due to room size and safety concerns, children are not allowed in the ultrasound rooms during exams. Our front office staff cannot provide observation of children in our lobby area while testing is being conducted. An adult accompanying a patient to their appointment will only be allowed in the ultrasound room at the discretion of the ultrasound technician under special circumstances. We apologize for any inconvenience.  Follow-Up: At [facility], you and your health needs are our priority.  As part of our continuing mission to provide you with exceptional heart care, our providers are all part of one team.  This team includes your primary Cardiologist (physician) and Advanced Practice Providers or APPs (Physician Assistants and Nurse  Practitioners) who all work together to provide you with the care you need, when you need it.  Your next appointment:   12 month(s)  Provider:   Lauro Portal, MD    We recommend signing up for the patient portal called MyChart.  Sign up information is provided on this After Visit Summary.  MyChart is used to connect with patients for Virtual Visits (Telemedicine).  Patients are able to view lab/test results, encounter notes, upcoming appointments, etc.  Non-urgent messages can be sent to your provider as well.   To learn more about what you can do with MyChart, go to [email or web address].

## 2023-09-16 NOTE — Assessment & Plan Note (Signed)
 History of CAD status post coronary bypass grafting by Dr. Mercer Stakes at [facility] in 1999.  He had an inferior STEMI October 25, 2007 with occlusion of his RCA vein graft and underwent AngioJet mechanical aspiration thrombectomy, angioplasty and stenting of the graft by Dr. Ebbie Goldmann.  He has done well since.  He is active and works out at [employer] and denies chest pain or shortness of breath.

## 2023-09-17 ENCOUNTER — Other Ambulatory Visit

## 2023-09-17 DIAGNOSIS — Z01818 Encounter for other preprocedural examination: Secondary | ICD-10-CM

## 2023-09-17 LAB — URINALYSIS, ROUTINE W REFLEX MICROSCOPIC
Bilirubin, UA: NEGATIVE
Glucose, UA: NEGATIVE
Ketones, UA: NEGATIVE
Leukocytes,UA: NEGATIVE
Nitrite, UA: NEGATIVE
Protein,UA: NEGATIVE
RBC, UA: NEGATIVE
Specific Gravity, UA: 1.025 (ref 1.005–1.030)
Urobilinogen, Ur: 1 mg/dL (ref 0.2–1.0)
pH, UA: 6 (ref 5.0–7.5)

## 2023-09-17 NOTE — Progress Notes (Signed)
 Last read by Jaamal F Lingelbach at 1:45PM on 09/16/2023.

## 2023-09-18 ENCOUNTER — Ambulatory Visit: Payer: Self-pay | Admitting: Cardiovascular Disease

## 2023-09-21 ENCOUNTER — Ambulatory Visit: Payer: Self-pay

## 2023-10-06 ENCOUNTER — Encounter: Payer: Self-pay | Admitting: Urology

## 2023-10-06 NOTE — Progress Notes (Unsigned)
 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  He has 1 core in the ROI with 10% Gleason pattern 4, making that a GG2 core.   I reviewed the results with him and he is most interested in pursuing a course of surveillance.  I discussed Decipher but he didn't seem interested at this time.  He would like f/u in [place] so I will get him set up with one of my colleagues.

## 2023-10-08 ENCOUNTER — Telehealth: Payer: Self-pay

## 2023-10-08 NOTE — Telephone Encounter (Signed)
 Called pt to schedule per MD wrenn pt sated he would call back later this afternoon or on Monday due to him not having his calendar with pt advised current availability may change to call back at his earliest convenience. Pt voiced his understanding

## 2023-10-08 NOTE — Telephone Encounter (Signed)
-----   Message from Norleen Seltzer sent at 10/06/2023  4:38 PM EDT ----- Regarding: Prostate cancer f/u I have given him the results of his prostate biopsy and he is most interested in surveillance and having f/u in [place].  Could you get him set up to see One of the other docs in the next month or so to discuss the biopsy results and options further with him.    Thanks ----- Message ----- From: Sammie Exie HERO, CMA Sent: 10/05/2023   4:09 PM EDT To: Norleen Seltzer, MD  No f/u on file with tinnie

## 2023-10-12 NOTE — Telephone Encounter (Signed)
 Patient came in to make follow up appt, He is set for 12/15/23 with Dr Matilda , he is also on the wait list. If you need to change , he said just call him

## 2023-10-12 NOTE — Telephone Encounter (Signed)
 Patient scheduled for 08/26 at 1130 am with Dr. Matilda.  Patient aware.

## 2023-10-19 NOTE — Progress Notes (Signed)
 H&P  Chief Complaint: New diagnosis of prostate cancer  History of Present Illness: This 83 year old male is here today to see me for the first time.  Previously, he has seen Dr Watt.  He underwent an MRI for an elevated PSA (5.2).  This revealed a 31 mL prostate gland with a PI-RADS 5 lesion in the transition zone.  Subsequently underwent transrectal ultrasound and biopsy/fusion biopsy on June 30.  3 cores were taken from the region of interest.  1 of these revealed grade group 2 prostate cancer in 50% of core.  The other 2 revealed grade group 1 adenocarcinoma, and 50% of both cores.  All 4 cores from the systematic apical biopsies revealed grade group 1 carcinoma.  He is interested in active surveillance.  Past Medical History:  Diagnosis Date   Carotid artery disease (HCC)    Coronary artery disease    history of coronary artery bypass grafting in 1999   Gout    Hyperlipidemia    Hypertension    Myocardial infarction [facility]) 2009    Past Surgical History:  Procedure Laterality Date   CARDIAC CATHETERIZATION  2009   CATARACT EXTRACTION W/PHACO Right 09/09/2012   Procedure: CATARACT EXTRACTION PHACO AND INTRAOCULAR LENS PLACEMENT (IOC);  Surgeon: Cherene Mania, MD;  Location: [facility] ORS;  Service: Ophthalmology;  Laterality: Right;  CDE 14.48   CATARACT EXTRACTION W/PHACO Left 10/07/2012   Procedure: CATARACT EXTRACTION PHACO AND INTRAOCULAR LENS PLACEMENT (IOC);  Surgeon: Cherene Mania, MD;  Location: [facility] ORS;  Service: Ophthalmology;  Laterality: Left;  CDE:12.24   COLONOSCOPY     COLONOSCOPY N/A 02/11/2017   Procedure: COLONOSCOPY;  Surgeon: Shaaron Lamar HERO, MD;  Location: [facility] ENDO SUITE;  Service: Endoscopy;  Laterality: N/A;  7:30am   CORONARY ARTERY BYPASS GRAFT  2004   triple   DOPPLER ECHOCARDIOGRAPHY  2010   NM MYOVIEW  LTD  2012   TONSILLECTOMY     as a child    Home Medications:  Allergies as of 10/20/2023       Reactions   Levaquin [levofloxacin] Itching, Rash         Medication List        Accurate as of October 19, 2023 12:40 PM. If you have any questions, ask your nurse or doctor.          aspirin  EC 81 MG tablet Take 1 tablet (81 mg total) by mouth daily.   carvedilol  12.5 MG tablet Commonly known as: COREG  TAKE 1 TABLET (12.5MG  TOTAL) BY MOUTH TWICE A DAY WITH MEALS   clopidogrel  75 MG tablet Commonly known as: PLAVIX  TAKE 1 TABLET BY MOUTH EVERY DAY   CO Q 10 PO Take by mouth daily.   ibuprofen 200 MG tablet Commonly known as: ADVIL Take 400-600 mg by mouth daily as needed for moderate pain.   nitroGLYCERIN  0.4 MG SL tablet Commonly known as: NITROSTAT  Place 1 tablet (0.4 mg total) under the tongue every 5 (five) minutes as needed for chest pain.   simvastatin  40 MG tablet Commonly known as: ZOCOR  TAKE 1 TABLET BY MOUTH EVERYDAY AT BEDTIME   sulfamethoxazole -trimethoprim  800-160 MG tablet Commonly known as: BACTRIM  DS 1 tablet po 1 hour prior to procedure.   tadalafil  5 MG tablet Commonly known as: CIALIS  Take 1 tablet (5 mg total) by mouth daily as needed for erectile dysfunction. Take 2 tabs in combination with the sildenafil as instructed.   VITAMIN B 12 PO Take 200 mg by mouth daily.   VITAMIN D3  PO Take 1,000 Units by mouth daily.        Allergies:  Allergies  Allergen Reactions   Levaquin [Levofloxacin] Itching and Rash    Family History  Problem Relation Age of Onset   Colon cancer Neg Hx     Social History:  reports that he has never smoked. He has never used smokeless tobacco. He reports current alcohol use. He reports that he does not use drugs.  ROS: A complete review of systems was performed.  All systems are negative except for pertinent findings as noted.  Physical Exam:  Vital signs in last 24 hours: There were no vitals taken for this visit. Constitutional:  Alert and oriented, No acute distress Cardiovascular: Regular rate  Respiratory: Normal respiratory effort GI: Abdomen is soft,  nontender, nondistended, no abdominal masses. No CVAT.  Genitourinary: Normal male phallus, testes are descended bilaterally and non-tender and without masses, scrotum is normal in appearance without lesions or masses, perineum is normal on inspection. Lymphatic: No lymphadenopathy Neurologic: Grossly intact, no focal deficits Psychiatric: Normal mood and affect  I have reviewed prior pt notes   I have independently reviewed prior imaging-MRI, ultrasound reviewed  I have reviewed prior PSA and pathology results--these were reviewed with the patient    Impression/Assessment:  Grade group 2 prostate cancer, beginning active surveillance  Plan:  Long discussion held with patient about his pathology and PSA data  I think it worthwhile to send his biopsy tissue for Decipher prostate  OV 6 mos following PSA

## 2023-10-20 ENCOUNTER — Ambulatory Visit: Admitting: Urology

## 2023-10-20 VITALS — BP 166/65 | HR 65

## 2023-10-20 DIAGNOSIS — C61 Malignant neoplasm of prostate: Secondary | ICD-10-CM | POA: Diagnosis not present

## 2023-11-05 ENCOUNTER — Other Ambulatory Visit: Payer: Self-pay | Admitting: Cardiovascular Disease

## 2023-12-01 ENCOUNTER — Ambulatory Visit: Admitting: Urology

## 2023-12-15 ENCOUNTER — Other Ambulatory Visit: Payer: Self-pay | Admitting: Cardiovascular Disease

## 2023-12-15 ENCOUNTER — Ambulatory Visit: Admitting: Urology

## 2024-03-22 ENCOUNTER — Ambulatory Visit
Admission: EM | Admit: 2024-03-22 | Discharge: 2024-03-22 | Disposition: A | Discharge status: Home / Self Care | Source: Home / Self Care

## 2024-03-22 DIAGNOSIS — J329 Chronic sinusitis, unspecified: Secondary | ICD-10-CM

## 2024-03-22 DIAGNOSIS — R059 Cough, unspecified: Secondary | ICD-10-CM

## 2024-03-22 DIAGNOSIS — B9789 Other viral agents as the cause of diseases classified elsewhere: Secondary | ICD-10-CM

## 2024-03-22 MED ORDER — CETIRIZINE HCL 10 MG PO TABS: 10.0000 mg | ORAL_TABLET | Freq: Every day | ORAL | 0 refills | Status: AC

## 2024-03-22 MED ORDER — BENZONATATE 100 MG PO CAPS: 100.0000 mg | ORAL_CAPSULE | Freq: Three times a day (TID) | ORAL | 0 refills | Status: AC | PRN

## 2024-03-22 MED ORDER — FLUTICASONE PROPIONATE 50 MCG/ACT NA SUSP: 2.0000 | Freq: Every day | NASAL | 0 refills | Status: AC

## 2024-03-22 MED ORDER — AMOXICILLIN-POT CLAVULANATE 875-125 MG PO TABS: 1.0000 | ORAL_TABLET | Freq: Two times a day (BID) | ORAL | 0 refills | Status: AC

## 2024-03-22 NOTE — Discharge Instructions (Addendum)
 Take medication as prescribed.  As discussed, if your symptoms have not improved over the next 5 to 7 days, start the antibiotic, Augmentin . You may take over-the-counter Tylenol as needed for pain, fever, or general discomfort. Recommend the use of normal saline nasal spray throughout the day for nasal congestion or runny nose. For your cough, recommend use of a humidifier in your bedroom and sleeping elevated at nighttime during sleep. Please call the number provided in your paperwork to find a primary care physician whom you can establish care with. If your symptoms fail to improve with this treatment, you may follow-up in this clinic for further evaluation. Follow-up as needed.

## 2024-03-22 NOTE — ED Triage Notes (Signed)
 Pt reports he has chest congestion, sinus irritation, and cough x 6 days

## 2024-03-22 NOTE — ED Provider Notes (Signed)
 [facility]    CSN: [id number] Arrival date & time: 03/22/24  1000      History   Chief Complaint No chief complaint on file.   HPI Jason Johnson is a 83 y.o. male.   The history is provided by the patient.   Patient presents with a 6-day history of chest congestion, sinus irritation, and cough.  Patient denies fever, chills, headache, ear pain, wheezing, difficulty breathing, chest pain, abdominal pain, nausea, vomiting, diarrhea, or rash.  Patient states that he has been around numerous people.  States that he does have a history of recurrent sinus issues every year.  So far, states he has been taking Robitussin for his symptoms.  Patient states that he needs help finding a new primary care provider.  Past Medical History:  Diagnosis Date   Carotid artery disease    Coronary artery disease    history of coronary artery bypass grafting in 1999   Gout    Hyperlipidemia    Hypertension    Myocardial infarction [facility]) 2009    Patient Active Problem List   Diagnosis Date Noted   Peripheral neuropathy 05/23/2022   Lumbar spondylosis 05/23/2022   Gait abnormality 02/10/2022   Paresthesia 02/10/2022   Weakness 02/10/2022   Irregular heartbeat 05/04/2019   Diverticula of colon 04/13/2017   Internal hemorrhoids 04/13/2017   Rectal bleeding 01/06/2017   History of arteriosclerotic cardiovascular disease 01/06/2017   Coronary artery disease 07/06/2013   Carotid artery disease 07/06/2013   Essential hypertension 07/06/2013   Hyperlipidemia 07/06/2013    Past Surgical History:  Procedure Laterality Date   CARDIAC CATHETERIZATION  2009   CATARACT EXTRACTION W/PHACO Right 09/09/2012   Procedure: CATARACT EXTRACTION PHACO AND INTRAOCULAR LENS PLACEMENT (IOC);  Surgeon: Cherene Mania, MD;  Location: [facility];  Service: Ophthalmology;  Laterality: Right;  CDE 14.48   CATARACT EXTRACTION W/PHACO Left 10/07/2012   Procedure: CATARACT EXTRACTION PHACO AND INTRAOCULAR LENS  PLACEMENT (IOC);  Surgeon: Cherene Mania, MD;  Location: [facility];  Service: Ophthalmology;  Laterality: Left;  CDE:12.24   COLONOSCOPY     COLONOSCOPY N/A 02/11/2017   Procedure: COLONOSCOPY;  Surgeon: Shaaron Lamar HERO, MD;  Location: [facility];  Service: Endoscopy;  Laterality: N/A;  7:30am   CORONARY ARTERY BYPASS GRAFT  2004   triple   DOPPLER ECHOCARDIOGRAPHY  2010   NM MYOVIEW  LTD  2012   TONSILLECTOMY     as a child       Home Medications    Prior to Admission medications  Medication Sig Start Date End Date Taking? Authorizing Provider  aspirin  EC 81 MG tablet Take 1 tablet (81 mg total) by mouth daily. 03/02/18   Court Dorn PARAS, MD  carvedilol  (COREG ) 12.5 MG tablet TAKE 1 TABLET (12.5MG  TOTAL) BY MOUTH TWICE A DAY WITH MEALS 11/06/23   Court Dorn PARAS, MD  Cholecalciferol (VITAMIN D3 PO) Take 1,000 Units by mouth daily.    [provider]  clopidogrel  (PLAVIX ) 75 MG tablet TAKE 1 TABLET BY MOUTH EVERY DAY 12/16/23   Court Dorn PARAS, MD  Coenzyme Q10 (CO Q 10 PO) Take by mouth daily.    [provider]  Cyanocobalamin (VITAMIN B 12 PO) Take 200 mg by mouth daily.    [provider]  ibuprofen (ADVIL,MOTRIN) 200 MG tablet Take 400-600 mg by mouth daily as needed for moderate pain.    [provider]  nitroGLYCERIN  (NITROSTAT ) 0.4 MG SL tablet Place 1 tablet (0.4 mg  total) under the tongue every 5 (five) minutes as needed for chest pain. 01/29/16   Court Dorn PARAS, MD  simvastatin  (ZOCOR ) 40 MG tablet TAKE 1 TABLET BY MOUTH EVERYDAY AT BEDTIME 12/16/23   Court Dorn PARAS, MD  sulfamethoxazole -trimethoprim  (BACTRIM  DS) 800-160 MG tablet 1 tablet po 1 hour prior to procedure. 08/06/23   Watt Rush, MD  tadalafil  (CIALIS ) 5 MG tablet Take 1 tablet (5 mg total) by mouth daily as needed for erectile dysfunction. Take 2 tabs in combination with the sildenafil as instructed. 12/04/22   Watt Rush, MD    Family History Family History  Problem  Relation Age of Onset   Colon cancer Neg Hx     Social History Social History[1]   Allergies   Levaquin [levofloxacin]   Review of Systems Review of Systems Per HPI  Physical Exam Triage Vital Signs ED Triage Vitals  Encounter Vitals Group     BP 03/22/24 1048 130/62     Girls Systolic BP Percentile --      Girls Diastolic BP Percentile --      Boys Systolic BP Percentile --      Boys Diastolic BP Percentile --      Pulse Rate 03/22/24 1048 65     Resp 03/22/24 1048 18     Temp 03/22/24 1048 98.2 F (36.8 C)     Temp Source 03/22/24 1048 Oral     SpO2 03/22/24 1048 95 %     Weight --      Height --      Head Circumference --      Peak Flow --      Pain Score 03/22/24 1049 0     Pain Loc --      Pain Education --      Exclude from Growth Chart --    No data found.  Updated Vital Signs BP 130/62 (BP Location: Right Arm)   Pulse 65   Temp 98.2 F (36.8 C) (Oral)   Resp 18   SpO2 95%   Visual Acuity Right Eye Distance:   Left Eye Distance:   Bilateral Distance:    Right Eye Near:   Left Eye Near:    Bilateral Near:     Physical Exam Vitals and nursing note reviewed.  Constitutional:      General: He is not in acute distress.    Appearance: Normal appearance. He is well-developed.  HENT:     Head: Normocephalic and atraumatic.     Right Ear: Tympanic membrane, ear canal and external ear normal.     Left Ear: Tympanic membrane, ear canal and external ear normal.     Nose: Congestion present.     Right Turbinates: Enlarged and swollen.     Left Turbinates: Enlarged and swollen.     Right Sinus: No maxillary sinus tenderness or frontal sinus tenderness.     Left Sinus: No maxillary sinus tenderness or frontal sinus tenderness.     Mouth/Throat:     Lips: Pink.     Mouth: Mucous membranes are moist.     Pharynx: Uvula midline. Posterior oropharyngeal erythema and postnasal drip present. No pharyngeal swelling, oropharyngeal exudate or uvula swelling.      Comments: Cobblestoning present to posterior oropharynx  Eyes:     Extraocular Movements: Extraocular movements intact.     Conjunctiva/sclera: Conjunctivae normal.     Pupils: Pupils are equal, round, and reactive to light.  Neck:     Thyroid : No thyromegaly.  Trachea: No tracheal deviation.  Cardiovascular:     Rate and Rhythm: Normal rate and regular rhythm.     Pulses: Normal pulses.     Heart sounds: Normal heart sounds.  Pulmonary:     Effort: Pulmonary effort is normal. No respiratory distress.     Breath sounds: Normal breath sounds. No stridor. No wheezing, rhonchi or rales.  Abdominal:     General: Bowel sounds are normal.     Palpations: Abdomen is soft.     Tenderness: There is no abdominal tenderness.  Musculoskeletal:     Cervical back: Normal range of motion and neck supple.  Skin:    General: Skin is warm and dry.  Neurological:     General: No focal deficit present.     Mental Status: He is alert and oriented to person, place, and time.  Psychiatric:        Mood and Affect: Mood normal.        Behavior: Behavior normal.        Thought Content: Thought content normal.        Judgment: Judgment normal.      UC Treatments / Results  Labs (all labs ordered are listed, but only abnormal results are displayed) Labs Reviewed - No data to display  EKG   Radiology No results found.  Procedures Procedures (including critical care time)  Medications Ordered in UC Medications - No data to display  Initial Impression / Assessment and Plan / UC Course  I have reviewed the triage vital signs and the nursing notes.  Pertinent labs & imaging results that were available during my care of the patient were reviewed by me and considered in my medical decision making (see chart for details).  On exam, the patient is well-appearing, he is in no acute distress, vital signs are stable.  Lung sounds are clear throughout, room air sats are at 95%.  Symptoms  consistent with viral etiology.  Will provide symptomatic treatment with benzonatate , fluticasone  50 mcg nasal spray, and cetirizine  10 mg.  Patient was advised if symptoms fail to improve over the next 5 to 7 days, to start prescription provided for Augmentin  875/125 mg.  Supportive care recommendations were provided and discussed with the patient to include fluids, rest, over-the-counter Tylenol, use of normal saline nasal spray, and use of a humidifier during sleep.  Patient was given information to establish care with a new PCP during discharge.  Patient was in agreement with this plan of care and verbalizes understanding.  All questions were answered.  Patient stable for discharge.   Final Clinical Impressions(s) / UC Diagnoses   Final diagnoses:  None   Discharge Instructions   None    ED Prescriptions   None    PDMP not reviewed this encounter.     [1]  Social History Tobacco Use   Smoking status: Never   Smokeless tobacco: Never  Vaping Use   Vaping status: Never Used  Substance Use Topics   Alcohol use: Yes    Comment: Occasionally   Drug use: No     Gilmer Etta PARAS, NP 03/22/24 1131

## 2024-04-26 ENCOUNTER — Other Ambulatory Visit

## 2024-04-26 DIAGNOSIS — C61 Malignant neoplasm of prostate: Secondary | ICD-10-CM

## 2024-04-27 ENCOUNTER — Ambulatory Visit: Payer: Self-pay | Admitting: Urology

## 2024-04-27 LAB — PSA: Prostate Specific Ag, Serum: 6.6 ng/mL — ABNORMAL HIGH (ref 0.0–4.0)

## 2024-05-01 NOTE — Progress Notes (Unsigned)
 "   Impression/Assessment:  Grade group 2 prostate cancer, beginning active surveillance  Plan:  Long discussion held with patient about his pathology and PSA data  I think it worthwhile to send his biopsy tissue for Decipher prostate  OV 6 mos following PSA     History of Present Illness:   7.15.2025: This 84 year old male is here today to see me for the first time.  Previously, he has seen Dr Watt.  He underwent an MRI for an elevated PSA (5.2).  This revealed a 31 mL prostate gland with a PI-RADS 5 lesion in the transition zone.  Subsequently underwent transrectal ultrasound and biopsy/fusion biopsy on October 05, 2023.  3 cores were taken from the region of interest.  1 of these revealed grade group 2 prostate cancer in 50% of core.  The other 2 revealed grade group 1 adenocarcinoma, and 50% of both cores.  All 4 cores from the systematic apical biopsies revealed grade group 1 carcinoma.  He is interested in active surveillance. Decipher requested but not done by vendor.  1.27.2026: PSA 6.6  Past Medical History:  Diagnosis Date   Carotid artery disease    Coronary artery disease    history of coronary artery bypass grafting in 1999   Gout    Hyperlipidemia    Hypertension    Myocardial infarction [facility]) 2009    Past Surgical History:  Procedure Laterality Date   CARDIAC CATHETERIZATION  2009   CATARACT EXTRACTION W/PHACO Right 09/09/2012   Procedure: CATARACT EXTRACTION PHACO AND INTRAOCULAR LENS PLACEMENT (IOC);  Surgeon: Cherene Mania, MD;  Location: [facility];  Service: Ophthalmology;  Laterality: Right;  CDE 14.48   CATARACT EXTRACTION W/PHACO Left 10/07/2012   Procedure: CATARACT EXTRACTION PHACO AND INTRAOCULAR LENS PLACEMENT (IOC);  Surgeon: Cherene Mania, MD;  Location: [facility];  Service: Ophthalmology;  Laterality: Left;  CDE:12.24   COLONOSCOPY     COLONOSCOPY N/A 02/11/2017   Procedure: COLONOSCOPY;  Surgeon: Shaaron Lamar HERO, MD;  Location: [facility];  Service:  Endoscopy;  Laterality: N/A;  7:30am   CORONARY ARTERY BYPASS GRAFT  2004   triple   DOPPLER ECHOCARDIOGRAPHY  2010   NM MYOVIEW  LTD  2012   TONSILLECTOMY     as a child    Home Medications:  Allergies as of 05/03/2024       Reactions   Levaquin [levofloxacin] Itching, Rash        Medication List        Accurate as of May 01, 2024  9:52 AM. If you have any questions, ask your nurse or doctor.          amoxicillin -clavulanate 875-125 MG tablet Commonly known as: AUGMENTIN  Take 1 tablet by mouth every 12 (twelve) hours.   aspirin  EC 81 MG tablet Take 1 tablet (81 mg total) by mouth daily.   benzonatate  100 MG capsule Commonly known as: TESSALON  Take 1 capsule (100 mg total) by mouth 3 (three) times daily as needed for cough.   carvedilol  12.5 MG tablet Commonly known as: COREG  TAKE 1 TABLET (12.5MG  TOTAL) BY MOUTH TWICE A DAY WITH MEALS   cetirizine  10 MG tablet Commonly known as: ZYRTEC  Take 1 tablet (10 mg total) by mouth daily.   clopidogrel  75 MG tablet Commonly known as: PLAVIX  TAKE 1 TABLET BY MOUTH EVERY DAY   CO Q 10 PO Take by mouth daily.   fluticasone  50 MCG/ACT nasal spray Commonly known as: FLONASE  Place 2 sprays into both nostrils daily.  ibuprofen 200 MG tablet Commonly known as: ADVIL Take 400-600 mg by mouth daily as needed for moderate pain.   nitroGLYCERIN  0.4 MG SL tablet Commonly known as: NITROSTAT  Place 1 tablet (0.4 mg total) under the tongue every 5 (five) minutes as needed for chest pain.   simvastatin  40 MG tablet Commonly known as: ZOCOR  TAKE 1 TABLET BY MOUTH EVERYDAY AT BEDTIME   sulfamethoxazole -trimethoprim  800-160 MG tablet Commonly known as: BACTRIM  DS 1 tablet po 1 hour prior to procedure.   tadalafil  5 MG tablet Commonly known as: CIALIS  Take 1 tablet (5 mg total) by mouth daily as needed for erectile dysfunction. Take 2 tabs in combination with the sildenafil as instructed.   VITAMIN B 12 PO Take 200  mg by mouth daily.   VITAMIN D3 PO Take 1,000 Units by mouth daily.        Allergies:  Allergies  Allergen Reactions   Levaquin [Levofloxacin] Itching and Rash    Family History  Problem Relation Age of Onset   Colon cancer Neg Hx     Social History:  reports that he has never smoked. He has never used smokeless tobacco. He reports current alcohol use. He reports that he does not use drugs.  ROS: A complete review of systems was performed.  All systems are negative except for pertinent findings as noted.  Physical Exam:  Vital signs in last 24 hours: There were no vitals taken for this visit. Constitutional:  Alert and oriented, No acute distress Cardiovascular: Regular rate  Respiratory: Normal respiratory effort GI: Abdomen is soft, nontender, nondistended, no abdominal masses. No CVAT.  Genitourinary: Normal male phallus, testes are descended bilaterally and non-tender and without masses, scrotum is normal in appearance without lesions or masses, perineum is normal on inspection. Lymphatic: No lymphadenopathy Neurologic: Grossly intact, no focal deficits Psychiatric: Normal mood and affect  I have reviewed prior pt notes   I have independently reviewed prior imaging-MRI, ultrasound reviewed  I have reviewed prior PSA and pathology results--these were reviewed with the patient      "

## 2024-05-03 ENCOUNTER — Ambulatory Visit: Admitting: Urology

## 2024-05-03 DIAGNOSIS — C61 Malignant neoplasm of prostate: Secondary | ICD-10-CM

## 2024-06-01 ENCOUNTER — Ambulatory Visit: Admitting: Family Medicine

## 2024-06-07 ENCOUNTER — Ambulatory Visit: Admitting: Urology

## 2024-07-25 ENCOUNTER — Ambulatory Visit: Payer: Self-pay
